# Patient Record
Sex: Female | Born: 1935
Health system: Southern US, Community
[De-identification: ages and names within clinical notes are randomized; demographics above are authoritative.]

## PROBLEM LIST (undated history)

## (undated) DIAGNOSIS — H269 Unspecified cataract: Secondary | ICD-10-CM

## (undated) DIAGNOSIS — T7840XA Allergy, unspecified, initial encounter: Secondary | ICD-10-CM

## (undated) DIAGNOSIS — C439 Malignant melanoma of skin, unspecified: Secondary | ICD-10-CM

## (undated) DIAGNOSIS — N390 Urinary tract infection, site not specified: Secondary | ICD-10-CM

## (undated) DIAGNOSIS — Z5189 Encounter for other specified aftercare: Secondary | ICD-10-CM

## (undated) DIAGNOSIS — M069 Rheumatoid arthritis, unspecified: Secondary | ICD-10-CM

## (undated) DIAGNOSIS — D649 Anemia, unspecified: Secondary | ICD-10-CM

## (undated) DIAGNOSIS — C801 Malignant (primary) neoplasm, unspecified: Secondary | ICD-10-CM

## (undated) HISTORY — DX: Malignant (primary) neoplasm, unspecified: C80.1

## (undated) HISTORY — DX: Urinary tract infection, site not specified: N39.0

## (undated) HISTORY — DX: Unspecified cataract: H26.9

## (undated) HISTORY — DX: Anemia, unspecified: D64.9

## (undated) HISTORY — PX: MASTECTOMY: SHX3

## (undated) HISTORY — PX: APPENDECTOMY: SHX54

## (undated) HISTORY — DX: Encounter for other specified aftercare: Z51.89

## (undated) HISTORY — PX: FRACTURE SURGERY: SHX138

## (undated) HISTORY — DX: Allergy, unspecified, initial encounter: T78.40XA

## (undated) HISTORY — PX: OTHER SURGICAL HISTORY: SHX169

## (undated) HISTORY — PX: JOINT REPLACEMENT: SHX530

## (undated) HISTORY — DX: Rheumatoid arthritis, unspecified: M06.9

## (undated) HISTORY — DX: Malignant melanoma of skin, unspecified: C43.9

---

## 2008-08-03 HISTORY — PX: BREAST SURGERY: SHX581

## 2011-02-10 ENCOUNTER — Inpatient Hospital Stay (INDEPENDENT_AMBULATORY_CARE_PROVIDER_SITE_OTHER)
Admission: RE | Admit: 2011-02-10 | Discharge: 2011-02-10 | Disposition: A | Discharge status: Home / Self Care | Payer: Medicare Other | Source: Ambulatory Visit | Attending: Emergency Medicine | Admitting: Emergency Medicine

## 2011-02-10 DIAGNOSIS — Z0489 Encounter for examination and observation for other specified reasons: Secondary | ICD-10-CM

## 2011-06-29 ENCOUNTER — Ambulatory Visit: Payer: Medicare Other | Attending: Family Medicine | Admitting: Physical Therapy

## 2011-06-29 DIAGNOSIS — M25559 Pain in unspecified hip: Secondary | ICD-10-CM | POA: Insufficient documentation

## 2011-06-29 DIAGNOSIS — R269 Unspecified abnormalities of gait and mobility: Secondary | ICD-10-CM | POA: Insufficient documentation

## 2011-06-29 DIAGNOSIS — R5381 Other malaise: Secondary | ICD-10-CM | POA: Insufficient documentation

## 2011-06-29 DIAGNOSIS — IMO0001 Reserved for inherently not codable concepts without codable children: Secondary | ICD-10-CM | POA: Insufficient documentation

## 2011-07-08 ENCOUNTER — Ambulatory Visit: Payer: Medicare Other | Attending: Family Medicine

## 2011-07-08 DIAGNOSIS — M25559 Pain in unspecified hip: Secondary | ICD-10-CM | POA: Insufficient documentation

## 2011-07-08 DIAGNOSIS — R5381 Other malaise: Secondary | ICD-10-CM | POA: Insufficient documentation

## 2011-07-08 DIAGNOSIS — R269 Unspecified abnormalities of gait and mobility: Secondary | ICD-10-CM | POA: Insufficient documentation

## 2011-07-08 DIAGNOSIS — IMO0001 Reserved for inherently not codable concepts without codable children: Secondary | ICD-10-CM | POA: Insufficient documentation

## 2011-07-13 ENCOUNTER — Ambulatory Visit: Payer: Medicare Other | Admitting: Physical Therapy

## 2011-07-15 ENCOUNTER — Ambulatory Visit: Payer: Medicare Other

## 2011-07-16 ENCOUNTER — Ambulatory Visit (INDEPENDENT_AMBULATORY_CARE_PROVIDER_SITE_OTHER): Payer: Medicare Other | Admitting: Family Medicine

## 2011-07-16 DIAGNOSIS — G478 Other sleep disorders: Secondary | ICD-10-CM

## 2011-07-16 DIAGNOSIS — M069 Rheumatoid arthritis, unspecified: Secondary | ICD-10-CM

## 2011-07-16 DIAGNOSIS — M25519 Pain in unspecified shoulder: Secondary | ICD-10-CM

## 2011-07-20 ENCOUNTER — Ambulatory Visit: Payer: Medicare Other | Admitting: Physical Therapy

## 2011-07-22 ENCOUNTER — Ambulatory Visit: Payer: Medicare Other

## 2011-07-23 ENCOUNTER — Ambulatory Visit: Payer: Self-pay | Admitting: Family Medicine

## 2011-08-05 ENCOUNTER — Ambulatory Visit: Payer: Medicare Other | Attending: Family Medicine

## 2011-08-05 DIAGNOSIS — IMO0001 Reserved for inherently not codable concepts without codable children: Secondary | ICD-10-CM | POA: Insufficient documentation

## 2011-08-05 DIAGNOSIS — R5381 Other malaise: Secondary | ICD-10-CM | POA: Insufficient documentation

## 2011-08-05 DIAGNOSIS — R269 Unspecified abnormalities of gait and mobility: Secondary | ICD-10-CM | POA: Insufficient documentation

## 2011-08-05 DIAGNOSIS — M25559 Pain in unspecified hip: Secondary | ICD-10-CM | POA: Insufficient documentation

## 2011-08-10 ENCOUNTER — Ambulatory Visit: Payer: Medicare Other

## 2011-08-12 ENCOUNTER — Ambulatory Visit: Payer: Medicare Other

## 2011-08-19 ENCOUNTER — Ambulatory Visit: Payer: Medicare Other

## 2011-08-26 ENCOUNTER — Ambulatory Visit: Payer: Medicare Other

## 2011-09-01 ENCOUNTER — Ambulatory Visit: Payer: Medicare Other | Admitting: Physical Therapy

## 2011-09-03 ENCOUNTER — Ambulatory Visit: Payer: Medicare Other | Admitting: Physical Therapy

## 2011-09-09 ENCOUNTER — Ambulatory Visit: Payer: Medicare Other | Attending: Family Medicine

## 2011-09-09 DIAGNOSIS — IMO0001 Reserved for inherently not codable concepts without codable children: Secondary | ICD-10-CM | POA: Insufficient documentation

## 2011-09-09 DIAGNOSIS — M25559 Pain in unspecified hip: Secondary | ICD-10-CM | POA: Insufficient documentation

## 2011-09-09 DIAGNOSIS — R5381 Other malaise: Secondary | ICD-10-CM | POA: Insufficient documentation

## 2011-09-09 DIAGNOSIS — R269 Unspecified abnormalities of gait and mobility: Secondary | ICD-10-CM | POA: Insufficient documentation

## 2011-09-10 ENCOUNTER — Ambulatory Visit: Payer: Medicare Other | Admitting: Family Medicine

## 2011-09-10 ENCOUNTER — Encounter: Payer: Self-pay | Admitting: Family Medicine

## 2011-09-10 ENCOUNTER — Telehealth: Payer: Self-pay

## 2011-09-10 ENCOUNTER — Ambulatory Visit (INDEPENDENT_AMBULATORY_CARE_PROVIDER_SITE_OTHER): Payer: Medicare Other | Admitting: Family Medicine

## 2011-09-10 VITALS — BP 143/70 | HR 95 | Temp 98.2°F | Resp 20 | Ht 62.0 in | Wt 119.2 lb

## 2011-09-10 DIAGNOSIS — M25519 Pain in unspecified shoulder: Secondary | ICD-10-CM

## 2011-09-10 DIAGNOSIS — G622 Polyneuropathy due to other toxic agents: Secondary | ICD-10-CM

## 2011-09-10 DIAGNOSIS — M069 Rheumatoid arthritis, unspecified: Secondary | ICD-10-CM

## 2011-09-10 DIAGNOSIS — G4700 Insomnia, unspecified: Secondary | ICD-10-CM

## 2011-09-10 DIAGNOSIS — M2550 Pain in unspecified joint: Secondary | ICD-10-CM

## 2011-09-10 DIAGNOSIS — G629 Polyneuropathy, unspecified: Secondary | ICD-10-CM | POA: Insufficient documentation

## 2011-09-10 DIAGNOSIS — R269 Unspecified abnormalities of gait and mobility: Secondary | ICD-10-CM

## 2011-09-10 DIAGNOSIS — Z9181 History of falling: Secondary | ICD-10-CM

## 2011-09-10 MED ORDER — ZOLPIDEM TARTRATE 5 MG PO TABS: ORAL_TABLET | ORAL | Status: DC

## 2011-09-10 MED ORDER — ZOLPIDEM TARTRATE 10 MG PO TABS: ORAL_TABLET | ORAL | Status: DC

## 2011-09-10 MED ORDER — DULOXETINE HCL 60 MG PO CPEP: 60.0000 mg | ORAL_CAPSULE | Freq: Every day | ORAL | Status: DC

## 2011-09-10 NOTE — Progress Notes (Signed)
  Subjective:    Patient ID: Patty Walters, female    DOB: 1922-05-13, 78 y.o.   MRN: [id number]  HPI  This elderly 78 y.o. Cauc female is here with her daughter for management of insomnia. The pt   [place] but had been trying to treat her sleeplessness  with OTC Diphehydramine which in ineffective. Also, use of pain med at bedtime tends to make the pt   a little unsteady on her feet. She is currently going to PT for gait training, chronic shoulder pain  and working on [employer]".  The pt did have a fall recently when she got up in middle of the night  to go to the bathroom (she had declined her daughter's offer for a bedside commode). After the fall,  she denied any significant pain or loss of mobility above her baseline. She has Rheumatoid Arthritis  and had been receiving an injectable medication which her daughter thinks was Reclast.   They would like a referral to Rheumatology for  evaluation and resumption of this med if appropriate.  She also needs a RF for Cymbalta which is very effective for chronic pain associated with RA.  Because of this chronic pain and the difficulty that travel to [place] would create, the pt and her daughter   went to [place] in December and and had a very enjoyable, relatively pain-free trip.  Review of Systems As per HPI; otherwise, noncontributory.  She has gained weight.    Objective:   Physical Exam  Vitals reviewed. Constitutional: She is oriented to person, place, and time. She appears well-developed and well-nourished. No distress.  HENT:  Head: Normocephalic and atraumatic.  Eyes: EOM are normal.  Pulmonary/Chest: Effort normal. No respiratory distress.  Musculoskeletal:       Shoulders: tender over anterior right joint/ biceps tendon. Decreased ROM- 90 degrees in anterior plane and abduction. (daughter states this is slightly improved with PT)  Neurological: She is alert and oriented to  person, place, and time. No cranial nerve deficit.  Skin: Skin is warm and dry.  Psychiatric: She has a normal mood and affect. Her behavior is normal. Thought content normal.          Assessment & Plan:   1. Insomnia  Rx: Ambien 5 mg 1 tablet every night.  D/C OTC med This medication had to be called to the pharmacy: it is a Controlled Substance and was not transmitted electronically.  2. Risk for falls  Place bedside commode for use at night. Continue PT.  3. Joint pain in the shoulder/clavicle region  Continue Iontophoresis which is effective. RF Cymbalta.  4. Neuropathy  This is chronic and stable. No further work-up planned at this time.  5. Rheumatoid arthritis  Ambulatory referral to Rheumatology

## 2011-09-10 NOTE — Telephone Encounter (Signed)
Per ramona dr Audria Nine called in [place] rx to pharmacy and notified pt

## 2011-09-10 NOTE — Patient Instructions (Signed)

## 2011-09-10 NOTE — Telephone Encounter (Signed)
Daughter states Remus Loffler rx was to be sent in to pharmacy today but pharmacy only has cymbalta rx

## 2011-09-11 ENCOUNTER — Telehealth: Payer: Self-pay

## 2011-09-11 MED ORDER — OXYCODONE-ACETAMINOPHEN 5-325 MG PO TABS: 1.0000 | ORAL_TABLET | Freq: Two times a day (BID) | ORAL | Status: AC

## 2011-09-11 NOTE — Telephone Encounter (Signed)
Called daughter and notified Rx ready for p/up.

## 2011-09-11 NOTE — Telephone Encounter (Signed)
Request must be approved by Dr. Audria Nine.  How often is she taking her pain medication?  Does it decrease her ability to safely move around or sedate her? (Please ask daughter and give response to Dr. Audria Nine.  DEA report is in her box in the doctor lounge).

## 2011-09-11 NOTE — Telephone Encounter (Signed)
Oxycodone Refill done.

## 2011-09-11 NOTE — Telephone Encounter (Signed)
.  [facility] [name] STATES SHE FORGOT TO GET HER MOM'S PERCOCET REFILLED ALONG WITH HER CYMBALTA PLEASE CALL [phone]   [facility] [address]

## 2011-09-15 ENCOUNTER — Ambulatory Visit: Payer: Medicare Other | Admitting: Physical Therapy

## 2011-09-18 ENCOUNTER — Ambulatory Visit: Payer: Medicare Other | Admitting: Family Medicine

## 2011-10-08 ENCOUNTER — Telehealth: Payer: Self-pay

## 2011-10-08 NOTE — Telephone Encounter (Signed)
Needs refill of pain medication earlier than usual because daughter is taking patient out of state for family gathering (possibly until 11/02/11).

## 2011-10-09 MED ORDER — OXYCODONE-ACETAMINOPHEN 5-325 MG PO TABS: 1.0000 | ORAL_TABLET | Freq: Two times a day (BID) | ORAL | Status: AC | PRN

## 2011-10-09 MED ORDER — OXYCODONE-ACETAMINOPHEN 5-325 MG PO TABS: 1.0000 | ORAL_TABLET | Freq: Three times a day (TID) | ORAL | Status: DC | PRN

## 2011-10-09 NOTE — Telephone Encounter (Signed)
Signed at TL desk - pt will need to pick-up

## 2011-10-09 NOTE — Telephone Encounter (Signed)
Leaving to go to [place] 10/13/11 for family gathering and will be gone at least 7-10 days and is requesting early refill on Oxycodone/Acetaminophine 5/325mg  1 po bid to take on the trip. Please advise and call when ready

## 2011-10-09 NOTE — Telephone Encounter (Signed)
Advised daughter that rx is ready for pickup

## 2011-11-09 ENCOUNTER — Telehealth: Payer: Self-pay

## 2011-11-09 NOTE — Telephone Encounter (Signed)
Pt daughter calling to request rx refill for percoset please contact when ready for pick-up

## 2011-11-09 NOTE — Telephone Encounter (Signed)
Dr. Audria Nine - please review.

## 2011-11-11 ENCOUNTER — Ambulatory Visit (INDEPENDENT_AMBULATORY_CARE_PROVIDER_SITE_OTHER): Payer: Medicare Other | Admitting: Family Medicine

## 2011-11-11 ENCOUNTER — Encounter: Payer: Self-pay | Admitting: Family Medicine

## 2011-11-11 VITALS — BP 123/65 | HR 87 | Temp 98.2°F | Resp 16 | Ht 62.0 in | Wt 124.0 lb

## 2011-11-11 DIAGNOSIS — R600 Localized edema: Secondary | ICD-10-CM

## 2011-11-11 DIAGNOSIS — R609 Edema, unspecified: Secondary | ICD-10-CM

## 2011-11-11 NOTE — Progress Notes (Signed)
78 yo woman with rheumatoid arthritis.  She saw Dr. Corliss Skains yesterday. She has been suffering with swollen right knee and edema of right lower extremity for several days.  She has been in the car quite a bit lately and daughter, who accompanies her today, is worried about a clot. No leg pain, warmth or redness  O:  1-2+ RIGHT lower extremity edema Heart regular at 72 per minute, no murmur or gallop Chest:  Clear Skin unremarkable  A:  Low risk for DVT given findings, but needs to be further evaluated.  P:  Venous doppler tomorrow. Elevate RLE bid.

## 2011-11-11 NOTE — Patient Instructions (Signed)
We will set up a venous doppler ASAP

## 2011-11-12 NOTE — Telephone Encounter (Signed)
Pt daughter is waiting on an order for a venus doppler to take her mom she doesn't know where she should be going pt daughter has an appt for dentist a 2pm should be done by 3pm she just wants to be to have time to get her mom there and not miss her appt, she is confused on if she was to call and make appt for her mom. Dr L seen pt in office. Please contact [phone]

## 2011-11-13 ENCOUNTER — Ambulatory Visit
Admission: RE | Admit: 2011-11-13 | Discharge: 2011-11-13 | Disposition: A | Discharge status: Home / Self Care | Payer: Medicare Other | Source: Ambulatory Visit | Attending: Family Medicine | Admitting: Family Medicine

## 2011-11-13 DIAGNOSIS — R600 Localized edema: Secondary | ICD-10-CM

## 2011-11-13 MED ORDER — OXYCODONE-ACETAMINOPHEN 5-325 MG PO TABS: 1.0000 | ORAL_TABLET | Freq: Three times a day (TID) | ORAL | Status: AC | PRN

## 2011-11-13 NOTE — Telephone Encounter (Signed)
Per April, patient states that Dr. Elbert Ewings gave patient this med at last OV.  Will shred rx.  Also, pt had dopplar on 4/10.

## 2011-11-13 NOTE — Telephone Encounter (Signed)
RX; oxycodone- APAP done and will be at [facility] for pick-up. Clairfy if Dr. Elbert Ewings was supposed to order the venous doppler since he was the provider who last saw pt.

## 2011-11-27 ENCOUNTER — Telehealth: Payer: Self-pay

## 2011-11-27 NOTE — Telephone Encounter (Signed)
LMOM to call back

## 2011-11-27 NOTE — Telephone Encounter (Signed)
Pt daughter is wanting to know if  Pt can get a referral for a four wheeled walker and if could she possibly get it with her moms medicare ins. Pt daughter would like for nurse or Dr to contact her.Marland Kitchen

## 2011-11-27 NOTE — Telephone Encounter (Signed)
Pts daughter would like to know if Remus Loffler can be called in

## 2011-11-30 NOTE — Telephone Encounter (Signed)
[name], pts daughter, called back and stated that they got the walker handled and do not need an order for this. Daughter stated that they had been OOT and had gotten Ambien filled for pt last time where they were, but now needs another RF. She states they are trying to not use it Qhs for pt and sometimes use OTC. She stated they are getting ready to leave town again w/pt and they will need a RF of pts pain medication also, please.

## 2011-11-30 NOTE — Telephone Encounter (Signed)
Unable to reach by phone

## 2011-12-01 NOTE — Telephone Encounter (Signed)
Pt daughter CB and further clarified that she had gotten the Ambien Rx filled for her mother while in [place] and they will not transfer the Rx back down here across state lines for another RF which is why she needs a Rx for that now. Daughter got a wheeled walker for pt second hand and just wanted to check w/Dr Audria Nine to make sure she thinks it is safe for her mother to use w/supervision. Pt is not able to pick up and put down a reg walker very well, but wants to make sure that the wheeled walker would be safe for her mother.

## 2011-12-02 ENCOUNTER — Other Ambulatory Visit: Payer: Self-pay | Admitting: Family Medicine

## 2011-12-02 MED ORDER — OXYCODONE-ACETAMINOPHEN 5-325 MG PO TABS: 1.0000 | ORAL_TABLET | Freq: Three times a day (TID) | ORAL | Status: DC | PRN

## 2011-12-02 MED ORDER — ZOLPIDEM TARTRATE 5 MG PO TABS: ORAL_TABLET | ORAL | Status: DC

## 2011-12-02 NOTE — Telephone Encounter (Signed)
Notified [name] that Rxs are ready for p/up and gave her message about PT for walker use. She stated she will call pt's PT that she had been seeing to see if she can get another appt. If not will CB for referral.

## 2011-12-02 NOTE — Telephone Encounter (Signed)
I will take care of the refills for Ambien and pain medication. I can not speak to the safety of the wheeled walker; often individuals need a little training with Physical Therapy to make sure they use the equipment properly and are not at risk of creating  a situation where they are at greater risk of falling. If the daughter observes that her mother is having difficulty ambulating with the walker or seems uncertain of safe use, then it would be best to have her work with a Physical therapist.

## 2011-12-29 ENCOUNTER — Telehealth: Payer: Self-pay

## 2011-12-29 NOTE — Telephone Encounter (Signed)
Pt daughter [name] would like for Dr Audria Nine nurse to contact her pt is due for a teeth cleaning and has had 2 hip replacements, [name] is wanting to know should she be prescribed some antibiotics to take before her teeth cleaning. [name] also would like Dr to know that she will be contacting pt insurance and they will be faxing an authorization form to [facility] this is to be filled out by Dr in order for pt to receive her rx on [place]. [name] is requesting a rx refill on pt percocet and to call when ready for pick up.Marland KitchenMarland Kitchen

## 2011-12-30 NOTE — Telephone Encounter (Signed)
Dr. Audria Nine, Please address.  Thanks.

## 2011-12-30 NOTE — Telephone Encounter (Signed)
Spoke with patients daughter and let her know that she will need to contact the dentist about the antibiotic.  She is going to have the insurance company fax over an authorization for the [place].  She would like a refill on the percocet and we can call her when ready for pick up if we can refill

## 2012-01-05 ENCOUNTER — Other Ambulatory Visit: Payer: Self-pay | Admitting: Family Medicine

## 2012-01-05 MED ORDER — OXYCODONE-ACETAMINOPHEN 5-325 MG PO TABS: 1.0000 | ORAL_TABLET | Freq: Three times a day (TID) | ORAL | Status: DC | PRN

## 2012-01-05 NOTE — Telephone Encounter (Signed)
RX for percocet done and will be at 102 for pick-up. I have refilled for #90 since Sig reads "1 tablet every 8 hours as needed for pain".

## 2012-01-06 NOTE — Telephone Encounter (Signed)
Notified pt that Rx is ready for p/up. Transferred to appt center for scheduling of appt.

## 2012-01-10 ENCOUNTER — Emergency Department (HOSPITAL_COMMUNITY)
Admission: EM | Admit: 2012-01-10 | Discharge: 2012-01-10 | Disposition: A | Discharge status: Home / Self Care | Payer: Medicare Other | Attending: Emergency Medicine | Admitting: Emergency Medicine

## 2012-01-10 ENCOUNTER — Encounter (HOSPITAL_COMMUNITY): Payer: Self-pay | Admitting: *Deleted

## 2012-01-10 DIAGNOSIS — R109 Unspecified abdominal pain: Secondary | ICD-10-CM | POA: Insufficient documentation

## 2012-01-10 DIAGNOSIS — M069 Rheumatoid arthritis, unspecified: Secondary | ICD-10-CM | POA: Insufficient documentation

## 2012-01-10 DIAGNOSIS — T148XXA Other injury of unspecified body region, initial encounter: Secondary | ICD-10-CM

## 2012-01-10 LAB — DIFFERENTIAL
Basophils Absolute: 0.1 10*3/uL (ref 0.0–0.1)
Basophils Relative: 1 % (ref 0–1)
Eosinophils Absolute: 0.4 10*3/uL (ref 0.0–0.7)
Monocytes Absolute: 0.4 10*3/uL (ref 0.1–1.0)
Monocytes Relative: 5 % (ref 3–12)
Neutro Abs: 6.1 10*3/uL (ref 1.7–7.7)

## 2012-01-10 LAB — BASIC METABOLIC PANEL
BUN: 36 mg/dL — ABNORMAL HIGH (ref 6–23)
Calcium: 9.1 mg/dL (ref 8.4–10.5)
Chloride: 106 mEq/L (ref 96–112)
Creatinine, Ser: 1.06 mg/dL (ref 0.50–1.10)
GFR calc Af Amer: 53 mL/min — ABNORMAL LOW (ref >90)
GFR calc non Af Amer: 45 mL/min — ABNORMAL LOW (ref >90)

## 2012-01-10 LAB — CBC
HCT: 28.2 % — ABNORMAL LOW (ref 36.0–46.0)
Hemoglobin: 9.3 g/dL — ABNORMAL LOW (ref 12.0–15.0)
MCH: 29.1 pg (ref 26.0–34.0)
MCHC: 33 g/dL (ref 30.0–36.0)
RDW: 13.6 % (ref 11.5–15.5)

## 2012-01-10 LAB — URINALYSIS, ROUTINE W REFLEX MICROSCOPIC
Ketones, ur: NEGATIVE mg/dL
Leukocytes, UA: NEGATIVE
Nitrite: NEGATIVE
Protein, ur: NEGATIVE mg/dL
Urobilinogen, UA: 0.2 mg/dL (ref 0.0–1.0)

## 2012-01-10 MED ORDER — OXYCODONE-ACETAMINOPHEN 5-325 MG PO TABS
1.0000 | ORAL_TABLET | Freq: Once | ORAL | Status: AC
  Administered 2012-01-10: 1 via ORAL
  Filled 2012-01-10: qty 1

## 2012-01-10 NOTE — ED Provider Notes (Signed)
History     CSN: [id number]  Arrival date & time 01/10/12  1219   First MD Initiated Contact with Patient 01/10/12 1411      Chief Complaint  Patient presents with  . Flank Pain    left    (Consider location/radiation/quality/duration/timing/severity/associated sxs/prior treatment) HPI Comments: Patient complains of left lateral pain along her lower abdomen.  Is not Amil Amen her left leg.  It is not over her rib cage.  Patient denies any nausea, vomiting, diarrhea.  She states she's had normal bowel movements.  No dysuria or hematuria today.  She's not eaten anything today but it's not because she's not hungry it's because family was going to bring her in for evaluation.  Patient denies any fevers.  There's been no new trauma, falls or other injuries.  Patient's been able to ambulate without difficulty.  She has no chest pain or shortness of breath.  She's not had pain like this before.  No history of kidney stones.  Patient is a 78 y.o. female presenting with flank pain. The history is provided by the patient and a relative. No language interpreter was used.  Flank Pain This is a new problem. Pertinent negatives include no chest pain, no abdominal pain, no headaches and no shortness of breath.    Past Medical History  Diagnosis Date  . Arthritis     Past Surgical History  Procedure Date  . Joint replacement   . Breast surgery Left mastectomy    History reviewed. No pertinent family history.  History  Substance Use Topics  . Smoking status: Never Smoker   . Smokeless tobacco: Never Used  . Alcohol Use: 0.6 oz/week    1 Glasses of wine per week     1 glass at dinner each night    OB History    Grav Para Term Preterm Abortions TAB SAB Ect Mult Living                  Review of Systems  Constitutional: Negative.  Negative for fever and chills.  HENT: Negative.   Eyes: Negative.  Negative for discharge and redness.  Respiratory: Negative.  Negative for cough and  shortness of breath.   Cardiovascular: Negative.  Negative for chest pain.  Gastrointestinal: Negative.  Negative for nausea, vomiting, abdominal pain and diarrhea.  Genitourinary: Negative for dysuria and flank pain.  Musculoskeletal: Positive for back pain.  Skin: Negative.  Negative for color change and rash.  Neurological: Negative.  Negative for syncope and headaches.  Hematological: Negative.  Negative for adenopathy.  Psychiatric/Behavioral: Negative.  Negative for confusion.  All other systems reviewed and are negative.    Allergies  Review of patient's allergies indicates no known allergies.  Home Medications   Current Outpatient Rx  Name Route Sig Dispense Refill  . ACETAMINOPHEN 500 MG PO TABS Oral Take 500 mg by mouth as needed.    Marland Kitchen CALCIUM CARBONATE-VITAMIN D 600-400 MG-UNIT PO TABS Oral Take 1 tablet by mouth daily.    . DULOXETINE HCL 60 MG PO CPEP Oral Take 1 capsule (60 mg total) by mouth daily. 30 capsule 5  . HYDROXYCHLOROQUINE SULFATE 200 MG PO TABS Oral Take by mouth daily.    Marland Kitchen ONE-DAILY MULTI VITAMINS PO TABS Oral Take 1 tablet by mouth daily.    . OXYCODONE-ACETAMINOPHEN 5-325 MG PO TABS Oral Take 1 tablet by mouth every 8 (eight) hours as needed. 90 tablet 0  . ZOLPIDEM TARTRATE 5 MG PO TABS  Take  1 tablet by mouth every night for insomnia. 30 tablet 3    BP 125/56  Pulse 77  Temp(Src) 97.8 F (36.6 C) (Oral)  Resp 17  SpO2 100%  Physical Exam  Nursing note and vitals reviewed. Constitutional: She is oriented to person, place, and time. She appears well-developed and well-nourished.  Non-toxic appearance. She does not have a sickly appearance.  HENT:  Head: Normocephalic and atraumatic.  Eyes: Conjunctivae, EOM and lids are normal. Pupils are equal, round, and reactive to light. No scleral icterus.  Neck: Trachea normal and normal range of motion. Neck supple.  Cardiovascular: Normal rate, regular rhythm and normal heart sounds.   Pulmonary/Chest:  Effort normal and breath sounds normal. No respiratory distress. She has no wheezes. She has no rales. She exhibits no tenderness.  Abdominal: Soft. Normal appearance. There is no tenderness. There is no rebound, no guarding and no CVA tenderness.  Musculoskeletal: Normal range of motion.       Arms: Neurological: She is alert and oriented to person, place, and time. She has normal strength.  Skin: Skin is warm, dry and intact. No rash noted.  Psychiatric: She has a normal mood and affect. Her behavior is normal. Judgment and thought content normal.    ED Course  Procedures (including critical care time)   Labs Reviewed  CBC  DIFFERENTIAL  BASIC METABOLIC PANEL  URINALYSIS, ROUTINE W REFLEX MICROSCOPIC   No results found.   No diagnosis found.    MDM  Patient with left lateral side pain along her lower abdomen.  It is of unclear etiology on history and exam.  Patient has no focal dysuria or hematuria.  No history of kidney stones.  She has a soft abdominal exam at this time.  She denies any nausea, vomiting or diarrhea to suggest GI cause.  She's had no fevers to suggest infection.  There is no signs of rash to suggest shingles.  No CVA tenderness to correlate with UTI.  No falls and no bony tenderness to suggest hip fracture or rib fracture.  Patient does not have any tenderness over her ribs on exam.  Patient has been able to ambulate today as per her normal.  I will check a UA for possible UTI and an atypical presentation.  We'll treat patient's pain with Percocet which she intermittently takes at home for her rheumatoid arthritis.  Check a CBC and BMP for occult infection or other abnormalities.        Nat Christen, MD 01/10/12 [phone]

## 2012-01-10 NOTE — ED Provider Notes (Signed)
Urinalysis reviewed and negative. She has reproducible tenderness on palpation of the oblique musculature. She will be discharged home with treatment for a muscle strain  Dayton Bailiff, MD 01/10/12 1655

## 2012-01-10 NOTE — Discharge Instructions (Signed)
Muscle Strain A muscle strain, or pulled muscle, occurs when a muscle is over-stretched. A small number of muscle fibers may also be torn. This is especially common in athletes. This happens when a sudden violent force placed on a muscle pushes it past its capacity. Usually, recovery from a pulled muscle takes 1 to 2 weeks. But complete healing will take 5 to 6 weeks. There are millions of muscle fibers. Following injury, your body will usually return to normal quickly. HOME CARE INSTRUCTIONS   While awake, apply ice to the sore muscle for 15 to 20 minutes each hour for the first 2 days. Put ice in a plastic bag and place a towel between the bag of ice and your skin.   Do not use the pulled muscle for several days. Do not use the muscle if you have pain.   You may wrap the injured area with an elastic bandage for comfort. Be careful not to bind it too tightly. This may interfere with blood circulation.   Only take over-the-counter or prescription medicines for pain, discomfort, or fever as directed by your caregiver. Do not use aspirin as this will increase bleeding (bruising) at injury site.   Warming up before exercise helps prevent muscle strains.  SEEK MEDICAL CARE IF:  There is increased pain or swelling in the affected area. MAKE SURE YOU:   Understand these instructions.   Will watch your condition.   Will get help right away if you are not doing well or get worse.  Document Released: 07/20/2005 Document Revised: 07/09/2011 Document Reviewed: 02/16/2007 Exit[employer] Patient Information 2012 [employer]. 

## 2012-01-10 NOTE — ED Notes (Signed)
Pt from home with reports of left flank pain that started during the night. Pt denies N/V/D, fall or dysuria.

## 2012-02-23 ENCOUNTER — Encounter (HOSPITAL_BASED_OUTPATIENT_CLINIC_OR_DEPARTMENT_OTHER): Payer: Self-pay | Admitting: Emergency Medicine

## 2012-02-23 ENCOUNTER — Emergency Department (HOSPITAL_BASED_OUTPATIENT_CLINIC_OR_DEPARTMENT_OTHER)
Admission: EM | Admit: 2012-02-23 | Discharge: 2012-02-24 | Disposition: A | Discharge status: Home / Self Care | Payer: Medicare Other | Attending: Emergency Medicine | Admitting: Emergency Medicine

## 2012-02-23 DIAGNOSIS — S7000XA Contusion of unspecified hip, initial encounter: Secondary | ICD-10-CM | POA: Insufficient documentation

## 2012-02-23 DIAGNOSIS — S0003XA Contusion of scalp, initial encounter: Secondary | ICD-10-CM | POA: Insufficient documentation

## 2012-02-23 DIAGNOSIS — W1811XA Fall from or off toilet without subsequent striking against object, initial encounter: Secondary | ICD-10-CM | POA: Insufficient documentation

## 2012-02-23 DIAGNOSIS — Z9010 Acquired absence of unspecified breast and nipple: Secondary | ICD-10-CM | POA: Insufficient documentation

## 2012-02-23 DIAGNOSIS — Y92009 Unspecified place in unspecified non-institutional (private) residence as the place of occurrence of the external cause: Secondary | ICD-10-CM | POA: Insufficient documentation

## 2012-02-23 DIAGNOSIS — Z79899 Other long term (current) drug therapy: Secondary | ICD-10-CM | POA: Insufficient documentation

## 2012-02-23 NOTE — ED Notes (Signed)
Pt started to sit down on the toilet, not knowing that her elevated toilet seat had been removed.  She went down to the toilet and then lost balance and fell off to the left, hitting her head on the cabinet on the way down.  Pt has been ambulatory post fall but was not able to go up stairs.  Skin is intact. Pt is not on blood thinners.  Sts head no longer hurts.

## 2012-02-24 ENCOUNTER — Emergency Department (HOSPITAL_BASED_OUTPATIENT_CLINIC_OR_DEPARTMENT_OTHER): Payer: Medicare Other

## 2012-02-24 MED ORDER — OXYCODONE-ACETAMINOPHEN 5-325 MG PO TABS: 1.0000 | ORAL_TABLET | ORAL | Status: DC | PRN

## 2012-02-24 NOTE — ED Provider Notes (Signed)
History     CSN: [id number]  Arrival date & time 02/23/12  2318   First MD Initiated Contact with Patient 02/23/12 2331      Chief Complaint  Patient presents with  . Fall    (Consider location/radiation/quality/duration/timing/severity/associated sxs/prior treatment) HPI Pt fell while trying to sit on toilet and fell to her left landing on her L hip and striking her head. No LOC. Pt has been able to ambulate since but was having pain climbing stair. Pt has been in her normal state of health. No fever, chills, N/V, focal weakness, sensory changes.  Past Medical History  Diagnosis Date  . Arthritis     Past Surgical History  Procedure Date  . Joint replacement   . Breast surgery Left mastectomy  . Mastectomy     No family history on file.  History  Substance Use Topics  . Smoking status: Never Smoker   . Smokeless tobacco: Never Used  . Alcohol Use: 0.6 oz/week    1 Glasses of wine per week     1 glass at dinner each night    OB History    Grav Para Term Preterm Abortions TAB SAB Ect Mult Living                  Review of Systems  Constitutional: Negative for fever and chills.  HENT: Negative for neck pain.   Eyes: Negative for visual disturbance.  Respiratory: Negative for shortness of breath.   Cardiovascular: Negative for chest pain.  Gastrointestinal: Negative for nausea, vomiting and abdominal pain.  Musculoskeletal: Negative for myalgias and back pain.  Skin: Negative for rash and wound.  Neurological: Positive for headaches. Negative for syncope, weakness and numbness.    Allergies  Review of patient's allergies indicates no known allergies.  Home Medications   Current Outpatient Rx  Name Route Sig Dispense Refill  . ACETAMINOPHEN 500 MG PO TABS Oral Take 500 mg by mouth as needed.    Marland Kitchen CALCIUM CARBONATE-VITAMIN D 600-400 MG-UNIT PO TABS Oral Take 1 tablet by mouth daily.    . DULOXETINE HCL 60 MG PO CPEP Oral Take 1 capsule (60 mg total) by  mouth daily. 30 capsule 5  . HYDROXYCHLOROQUINE SULFATE 200 MG PO TABS Oral Take by mouth daily.    Marland Kitchen ONE-DAILY MULTI VITAMINS PO TABS Oral Take 1 tablet by mouth daily.    . OXYCODONE-ACETAMINOPHEN 5-325 MG PO TABS Oral Take 1 tablet by mouth every 8 (eight) hours as needed. 90 tablet 0  . ZOLPIDEM TARTRATE 5 MG PO TABS  Take 1 tablet by mouth every night for insomnia. 30 tablet 3    BP 139/66  Pulse 70  Temp 98.5 F (36.9 C) (Oral)  Resp 16  Ht 5\' 5"  (1.651 m)  Wt 124 lb (56.246 kg)  BMI 20.63 kg/m2  SpO2 99%  Physical Exam  Nursing note and vitals reviewed. Constitutional: She is oriented to person, place, and time. She appears well-developed and well-nourished. No distress.  HENT:  Head: Normocephalic.  Mouth/Throat: Oropharynx is clear and moist.       Small hematoma to L parietal scalp. No deformity  Eyes: EOM are normal. Pupils are equal, round, and reactive to light.  Neck: Normal range of motion. Neck supple.       No posterior cervical TTP  Cardiovascular: Normal rate and regular rhythm.   Pulmonary/Chest: Effort normal and breath sounds normal. No respiratory distress. She has no wheezes. She has no rales.  Abdominal: Soft. Bowel sounds are normal. There is no tenderness. There is no rebound.  Musculoskeletal: Normal range of motion. She exhibits no edema and no tenderness.       FROM of L hip. No deformity or shortening. Mild ttp over lateral hip. 2+ DP bl  Neurological: She is alert and oriented to person, place, and time.       5/5 motor, sensation intact  Skin: Skin is warm and dry. No rash noted. No erythema.  Psychiatric: She has a normal mood and affect. Her behavior is normal.    ED Course  Procedures (including critical care time)  Labs Reviewed - No data to display Dg Hip Complete Left  02/24/2012  *RADIOLOGY REPORT*  Clinical Data: Fall with left hip pain.  LEFT HIP - COMPLETE 2+ VIEW  Comparison: None.  Findings: Bilateral total hip arthroplasties are  present.  There is no evidence of acute fracture or dislocation on the left. Components of the arthroplasty appear normally aligned.  There is no evidence of abnormal lucency surrounding the prosthesis.  Soft tissues are unremarkable.  IMPRESSION: No acute findings.  Normal alignment of left hip arthroplasty.  Original Report Authenticated By: Reola Calkins, M.D.   Ct Head Wo Contrast  02/24/2012  *RADIOLOGY REPORT*  Clinical Data: Fall with left head injury.  CT HEAD WITHOUT CONTRAST  Technique:  Contiguous axial images were obtained from the base of the skull through the vertex without contrast.  Comparison: None.  Findings: The brain stem, cerebellum, cerebral peduncles, thalami, basal ganglia, basilar cisterns, and ventricular system appear unremarkable.  Periventricular and corona radiata white matter hypodensities are most compatible with chronic ischemic microvascular white matter disease.  No intracranial hemorrhage, mass lesion, or acute infarction is identified.  Left vertex scalp hematoma noted.  IMPRESSION:  1.  Left vertex scalp hematoma. 2. Periventricular and corona radiata white matter hypodensities are most compatible with chronic ischemic microvascular white matter disease. 3.  No acute intracranial findings.  Original Report Authenticated By: Dellia Cloud, M.D.     1. Scalp hematoma   2. Contusion, hip       MDM          Loren Racer, MD 02/24/12 [phone]

## 2012-02-25 ENCOUNTER — Ambulatory Visit: Payer: Medicare Other

## 2012-02-25 ENCOUNTER — Ambulatory Visit (INDEPENDENT_AMBULATORY_CARE_PROVIDER_SITE_OTHER): Payer: Medicare Other | Admitting: Family Medicine

## 2012-02-25 ENCOUNTER — Encounter: Payer: Self-pay | Admitting: Family Medicine

## 2012-02-25 VITALS — BP 110/58 | HR 88 | Temp 98.2°F | Resp 16 | Wt 130.0 lb

## 2012-02-25 DIAGNOSIS — R0781 Pleurodynia: Secondary | ICD-10-CM

## 2012-02-25 DIAGNOSIS — D649 Anemia, unspecified: Secondary | ICD-10-CM

## 2012-02-25 DIAGNOSIS — R079 Chest pain, unspecified: Secondary | ICD-10-CM

## 2012-02-25 LAB — POCT CBC
Granulocyte percent: 78.8 %G (ref 37–80)
Hemoglobin: 10.2 g/dL — AB (ref 12.2–16.2)
MCH, POC: 27.6 pg (ref 27–31.2)
MCV: 91 fL (ref 80–97)
MPV: 7 fL (ref 0–99.8)
RBC: 3.69 M/uL — AB (ref 4.04–5.48)
WBC: 7.8 10*3/uL (ref 4.6–10.2)

## 2012-02-25 MED ORDER — OXYCODONE-ACETAMINOPHEN 5-325 MG PO TABS: 1.0000 | ORAL_TABLET | Freq: Three times a day (TID) | ORAL | Status: DC | PRN

## 2012-02-25 MED ORDER — DULOXETINE HCL 60 MG PO CPEP: 60.0000 mg | ORAL_CAPSULE | Freq: Every day | ORAL | Status: DC

## 2012-02-25 MED ORDER — HYDROXYCHLOROQUINE SULFATE 200 MG PO TABS: 200.0000 mg | ORAL_TABLET | Freq: Every day | ORAL | Status: DC

## 2012-02-25 NOTE — Progress Notes (Signed)
Subjective:    Patient ID: Patty Walters, female    DOB: 04-23-1922, 78 y.o.   MRN: [id number]  HPI Pt had a fall on 02/23/2012 and was evaluated at [facility] with head CT and left hip  xray. Today she c/o left rib pain which she can feel when she takes a deep breath. She still has pain  in left hip especially when climbing stairs. She denies cough with fever, hemoptysis or SOB. Her appetite is good and she has no nausea, vomiting or change in bladder or bowel habits. Thereihas been no bruising over the ribs in question.   Pt found to be anemic (this is chronic) upon evaluation in ED. Daughter would like blood counts  rechecked.   The history is obtained from pt's daughter with whom pt resides; she has concerns about persistent   pain because of an upcoming trip to [place] (they plan to travel by car).   Review of Systems  Constitutional: Negative.   Respiratory: Negative for cough, chest tightness, shortness of breath and wheezing.   Cardiovascular: Positive for chest pain. Negative for palpitations.       Rib pain on left side  Gastrointestinal: Negative.   Genitourinary: Negative.   Musculoskeletal: Positive for arthralgias and gait problem. Negative for joint swelling.  Neurological: Negative for dizziness, syncope, weakness, light-headedness and headaches.  Psychiatric/Behavioral: Negative for confusion, disturbed wake/sleep cycle and dysphoric mood. The patient is nervous/anxious.        Objective:   Physical Exam  Nursing note and vitals reviewed. Constitutional: She is oriented to person, place, and time. She appears well-developed and well-nourished. No distress.  HENT:  Head: Normocephalic and atraumatic.  Eyes: Conjunctivae and EOM are normal. No scleral icterus.  Cardiovascular: Normal rate and regular rhythm.   Pulmonary/Chest: Effort normal and breath sounds normal. No respiratory distress. She has no wheezes. She exhibits tenderness.       Left  ribcage: posterior mid-ribs tender with moderate palpation; no ecchymosis , redness or swelling  Musculoskeletal: She exhibits tenderness.       Bilat. Hip tenderness (L>R)  Neurological: She is alert and oriented to person, place, and time. No cranial nerve deficit.  Skin: Skin is warm and dry. No erythema. There is pallor.  Psychiatric: She has a normal mood and affect. Her behavior is normal. Thought content normal.    Results for orders placed in visit on 02/25/12  POCT CBC      Component Value Range   WBC 7.8  4.6 - 10.2 K/uL   Lymph, poc 1.2  0.6 - 3.4   POC LYMPH PERCENT 15.7  10 - 50 %L   MID (cbc) 0.4  0 - 0.9   POC MID % 5.5  0 - 12 %M   POC Granulocyte 6.1  2 - 6.9   Granulocyte percent 78.8  37 - 80 %G   RBC 3.69 (*) 4.04 - 5.48 M/uL   Hemoglobin 10.2 (*) 12.2 - 16.2 g/dL   HCT, POC 40.9 (*) 81.1 - 47.9 %   MCV 91.0  80 - 97 fL   MCH, POC 27.6  27 - 31.2 pg   MCHC 30.4 (*) 31.8 - 35.4 g/dL   RDW, POC 91.4     Platelet Count, POC 362  142 - 424 K/uL   MPV 7.0  0 - 99.8 fL     [facility] reading (PRIMARY) by  Dr. Audria Nine:  CXR- Heart size normal; increased peribronchial markings with no  acute changes or evidence of acute disease; very demineralized bones making it difficult to assess left rib abnormalities. Moderately severe kyphosis.        Assessment & Plan:   1. Rib pain on left side - due to contusion DG Chest 2 View, DG Ribs Unilateral W/Chest Left Continue Oxycodone-APAP 5-325 mg- RX printed Continue Tylenol 500 mg for mild pain  2. Anemia - Hgb improved from 9.3 last month in [facility] ED POCT CBC- see above

## 2012-02-25 NOTE — Patient Instructions (Addendum)
Rib Contusion A rib contusion (bruise) can occur by a blow to the chest or by a fall against a hard object. Usually these will be much better in a couple weeks. If X-rays were taken today and there are no broken bones (fractures), the diagnosis of bruising is made. However, broken ribs may not show up for several days, or may be discovered later on a routine X-ray when signs of healing show up. If this happens to you, it does not mean that something was missed on the X-ray, but simply that it did not show up on the first X-rays. Earlier diagnosis will not usually change the treatment. HOME CARE INSTRUCTIONS   Avoid strenuous activity. Be careful during activities and avoid bumping the injured ribs. Activities that pull on the injured ribs and cause pain should be avoided, if possible.   For the first day or two, an ice pack used every 20 minutes while awake may be helpful. Put ice in a plastic bag and put a towel between the bag and the skin.   Eat a normal, well-balanced diet. Drink plenty of fluids to avoid constipation.   Take deep breaths several times a day to keep lungs free of infection. Try to cough several times a day. Splint the injured area with a pillow while coughing to ease pain. Coughing can help prevent pneumonia.   Wear a rib belt or binder only if told to do so by your caregiver. If you are wearing a rib belt or binder, you must do the breathing exercises as directed by your caregiver. If not used properly, rib belts or binders restrict breathing which can lead to pneumonia.   Only take over-the-counter or prescription medicines for pain, discomfort, or fever as directed by your caregiver.  SEEK MEDICAL CARE IF:   You or your child has an oral temperature above 102 F (38.9 C).   Your baby is older than 3 months with a rectal temperature of 100.5 F (38.1 C) or higher for more than 1 day.   You develop a cough, with thick or bloody sputum.  SEEK IMMEDIATE MEDICAL CARE IF:     You have difficulty breathing.   You feel sick to your stomach (nausea), have vomiting or belly (abdominal) pain.   You have worsening pain, not controlled with medications, or there is a change in the location of the pain.   You develop sweating or radiation of the pain into the arms, jaw or shoulders, or become light headed or faint.   You or your child has an oral temperature above 102 F (38.9 C), not controlled by medicine.   Your or your baby is older than 3 months with a rectal temperature of 102 F (38.9 C) or higher.   Your baby is 20 months old or younger with a rectal temperature of 100.4 F (38 C) or higher.  MAKE SURE YOU:   Understand these instructions.   Will watch your condition.   Will get help right away if you are not doing well or get worse.  Document Released: 04/14/2001 Document Revised: 07/09/2011 Document Reviewed: 03/07/2008 [facility] Patient Information 2012 [place], [place]   .Contusion A contusion is a deep bruise. Contusions are the result of an injury that caused bleeding under the skin. The contusion may turn blue, purple, or yellow. Minor injuries will give you a painless contusion, but more severe contusions may stay painful and swollen for a few weeks.  CAUSES  A contusion is usually caused by  a blow, trauma, or direct force to an area of the body. SYMPTOMS   Swelling and redness of the injured area.   Bruising of the injured area.   Tenderness and soreness of the injured area.   Pain.  DIAGNOSIS  The diagnosis can be made by taking a history and physical exam. An X-ray, CT scan, or MRI may be needed to determine if there were any associated injuries, such as fractures. TREATMENT  Specific treatment will depend on what area of the body was injured. In general, the best treatment for a contusion is resting, icing, elevating, and applying cold compresses to the injured area. Over-the-counter medicines may also be recommended for pain  control. Ask your caregiver what the best treatment is for your contusion. HOME CARE INSTRUCTIONS   Put ice on the injured area.   Put ice in a plastic bag.   Place a towel between your skin and the bag.   Leave the ice on for 15 to 20 minutes, 3 to 4 times a day.   Only take over-the-counter or prescription medicines for pain, discomfort, or fever as directed by your caregiver. Your caregiver may recommend avoiding anti-inflammatory medicines (aspirin, ibuprofen, and naproxen) for 48 hours because these medicines may increase bruising.   Rest the injured area.   If possible, elevate the injured area to reduce swelling.  SEEK IMMEDIATE MEDICAL CARE IF:   You have increased bruising or swelling.   You have pain that is getting worse.   Your swelling or pain is not relieved with medicines.  MAKE SURE YOU:   Understand these instructions.   Will watch your condition.   Will get help right away if you are not doing well or get worse.  Document Released: 04/29/2005 Document Revised: 07/09/2011 Document Reviewed: 05/25/2011 [facility] Patient Information 2012 [place], [place].

## 2012-02-26 ENCOUNTER — Telehealth: Payer: Self-pay | Admitting: *Deleted

## 2012-02-26 NOTE — Telephone Encounter (Signed)
Left message in daughter's voice-mail with results of mother's xray results. Ribs negative no fractures, and chest normal no evidence of disease.

## 2012-02-29 DIAGNOSIS — D649 Anemia, unspecified: Secondary | ICD-10-CM | POA: Insufficient documentation

## 2012-03-02 ENCOUNTER — Telehealth: Payer: Self-pay

## 2012-03-02 NOTE — Telephone Encounter (Signed)
Pt's daughter states that pt was in on July 25,2013 for a fall, pt's feet are now swollen and she would like to know what should she do. [phone]

## 2012-03-02 NOTE — Telephone Encounter (Signed)
Spoke w/daughter and also advised her that she should bring pt in to be seen before trip to have swelling evaluated even if the swelling has improved a little since this is a new Sx for pt. Also D/W her Sxs of possible blood clot and advised her to bring pt in right away if she feels pt has any of these Sxs. Daughter agreed.

## 2012-03-02 NOTE — Telephone Encounter (Signed)
Called daughter she was here with her at office visit, Patty Walters, and left mssg for her to call me back, I am very concerned about her swollen feet and she needs to come back in.

## 2012-03-03 ENCOUNTER — Encounter: Payer: Self-pay | Admitting: Family Medicine

## 2012-03-03 ENCOUNTER — Ambulatory Visit (INDEPENDENT_AMBULATORY_CARE_PROVIDER_SITE_OTHER): Payer: Medicare Other | Admitting: Family Medicine

## 2012-03-03 VITALS — BP 134/71 | HR 82 | Temp 97.5°F | Resp 16 | Wt 132.0 lb

## 2012-03-03 DIAGNOSIS — R609 Edema, unspecified: Secondary | ICD-10-CM

## 2012-03-03 DIAGNOSIS — D649 Anemia, unspecified: Secondary | ICD-10-CM

## 2012-03-03 LAB — POCT CBC
Granulocyte percent: 78.1 %G (ref 37–80)
HCT, POC: 35 % — AB (ref 37.7–47.9)
Lymph, poc: 1.5 (ref 0.6–3.4)
MCHC: 29.1 g/dL — AB (ref 31.8–35.4)
MCV: 91.5 fL (ref 80–97)
MID (cbc): 0.5 (ref 0–0.9)
POC Granulocyte: 7 — AB (ref 2–6.9)
POC LYMPH PERCENT: 16.3 %L (ref 10–50)
Platelet Count, POC: 381 10*3/uL (ref 142–424)
RDW, POC: 14.3 %

## 2012-03-03 LAB — BASIC METABOLIC PANEL
BUN: 28 mg/dL — ABNORMAL HIGH (ref 6–23)
Calcium: 9.4 mg/dL (ref 8.4–10.5)
Glucose, Bld: 84 mg/dL (ref 70–99)
Potassium: 4.4 mEq/L (ref 3.5–5.3)

## 2012-03-03 MED ORDER — FUROSEMIDE 20 MG PO TABS: ORAL_TABLET | ORAL | Status: DC

## 2012-03-03 NOTE — Progress Notes (Signed)
  Subjective:    Patient ID: Patty Walters, female    DOB: 1922/01/08, 78 y.o.   MRN: [id number]  HPI    Review of Systems     Objective:   Physical Exam        Assessment & Plan:  7:15pm-spoke with patient's daughter "[name]."  Per Dr. Frederik Pear instructions, it is ok for her to take the lasix for a few days to help the swelling come off of her legs since her BUN<30.  Copy of labs made and left up front for them to p/up tomorrow.

## 2012-03-03 NOTE — Patient Instructions (Addendum)
Avoid excessive salt.  Get out of the car and walk frequently  Advise recheck as soon as she returned from your vacation. Go to an urgent care or emergency room if problems arise.  Only used the fluid pills if swelling seem significant. Continue to drink plenty of water.

## 2012-03-03 NOTE — Progress Notes (Signed)
Subjective: 78 year old lady who goes to Dr. Audria Nine. She was here one week ago. She is fallen and contused her self she has been and more swelling in her ankles last 3 days. She is getting ready to goes driving up to [place] tomorrow and a two-day drive to go to a family reading at the beach and wanted her checked before she went. She's not been short of breath. She's not had a cough. She is not had any chest pains.  Objective:  Pleasant lady in no acute distress. Her chest was clear. Heart regular without murmurs. Has 2+ pitting edema of the ankles and oh two thirds of the way up the anterior shin. No erythema.  Assessment: Edema Recent chest wall trauma anemia  Plan: BMET Stat, CBC, and EKG.  Her last BUN was 36 one month ago. Need to be certain that the BUN is safe for considering and a diuretic. At that frequent stops and low-salt diet.    Results for orders placed in visit on 03/03/12  POCT CBC      Component Value Range   WBC 8.9  4.6 - 10.2 K/uL   Lymph, poc 1.5  0.6 - 3.4   POC LYMPH PERCENT 16.3  10 - 50 %L   MID (cbc) 0.5  0 - 0.9   POC MID % 5.6  0 - 12 %M   POC Granulocyte 7.0 (*) 2 - 6.9   Granulocyte percent 78.1  37 - 80 %G   RBC 3.82 (*) 4.04 - 5.48 M/uL   Hemoglobin 10.2 (*) 12.2 - 16.2 g/dL   HCT, POC 16.1 (*) 09.6 - 47.9 %   MCV 91.5  80 - 97 fL   MCH, POC 26.7 (*) 27 - 31.2 pg   MCHC 29.1 (*) 31.8 - 35.4 g/dL   RDW, POC 04.5     Platelet Count, POC 381  142 - 424 K/uL   MPV 6.9  0 - 99.8 fL   Spoke with Marylene Land PA who will watch for stat labs and instruct on lasix

## 2012-03-04 ENCOUNTER — Encounter: Payer: Self-pay | Admitting: Family Medicine

## 2012-04-05 ENCOUNTER — Telehealth: Payer: Self-pay

## 2012-04-05 NOTE — Telephone Encounter (Signed)
pts daughter calling to say that mom needs refill on her perkiset if possible pharmacy [facility] [address]

## 2012-04-05 NOTE — Telephone Encounter (Signed)
Ok to refill Percocet

## 2012-04-07 ENCOUNTER — Other Ambulatory Visit: Payer: Self-pay | Admitting: Family Medicine

## 2012-04-07 MED ORDER — OXYCODONE-ACETAMINOPHEN 5-325 MG PO TABS: 1.0000 | ORAL_TABLET | Freq: Three times a day (TID) | ORAL | Status: DC | PRN

## 2012-04-07 NOTE — Telephone Encounter (Signed)
I have refilled the medication- this printed RX has to picked up. I will have staff member bring RX to [facility].

## 2012-05-09 ENCOUNTER — Telehealth: Payer: Self-pay

## 2012-05-09 NOTE — Telephone Encounter (Signed)
The patient's daughter called to request refill of Percocet.  Please call the patient's daughter when rx ready for pick up at [phone].

## 2012-05-11 ENCOUNTER — Other Ambulatory Visit: Payer: Self-pay | Admitting: Family Medicine

## 2012-05-11 MED ORDER — OXYCODONE-ACETAMINOPHEN 5-325 MG PO TABS: 1.0000 | ORAL_TABLET | Freq: Three times a day (TID) | ORAL | Status: DC | PRN

## 2012-06-02 ENCOUNTER — Ambulatory Visit: Payer: Medicare Other | Admitting: Family Medicine

## 2012-06-03 NOTE — Telephone Encounter (Signed)
Daughter [name], would like to have Ambien refilled.  The pharmacy [facility] has faxed twice, according to [facility] [place] with no refill.  Please call [phone]

## 2012-06-04 ENCOUNTER — Telehealth: Payer: Self-pay | Admitting: Physician Assistant

## 2012-06-04 MED ORDER — ZOLPIDEM TARTRATE 5 MG PO TABS: 5.0000 mg | ORAL_TABLET | Freq: Every evening | ORAL | Status: DC | PRN

## 2012-06-04 NOTE — Telephone Encounter (Signed)
I've already handled this-see other message from Pharmacy.  We received no request.  Order given.

## 2012-06-04 NOTE — Telephone Encounter (Signed)
Call from pharmacy regarding electronic request for this patient's Ambien.  Chart reviewed.  No request in queue.  Verbal order for the medication given, and documented in Meds & Orders.

## 2012-06-09 ENCOUNTER — Encounter: Payer: Self-pay | Admitting: Family Medicine

## 2012-06-09 ENCOUNTER — Ambulatory Visit (INDEPENDENT_AMBULATORY_CARE_PROVIDER_SITE_OTHER): Payer: Medicare Other | Admitting: Family Medicine

## 2012-06-09 VITALS — BP 126/75 | HR 70 | Temp 98.0°F | Resp 16 | Ht 65.0 in | Wt 133.0 lb

## 2012-06-09 DIAGNOSIS — M069 Rheumatoid arthritis, unspecified: Secondary | ICD-10-CM

## 2012-06-09 DIAGNOSIS — Z9181 History of falling: Secondary | ICD-10-CM

## 2012-06-09 DIAGNOSIS — M949 Disorder of cartilage, unspecified: Secondary | ICD-10-CM

## 2012-06-09 DIAGNOSIS — G894 Chronic pain syndrome: Secondary | ICD-10-CM

## 2012-06-09 DIAGNOSIS — D649 Anemia, unspecified: Secondary | ICD-10-CM

## 2012-06-09 DIAGNOSIS — R69 Illness, unspecified: Secondary | ICD-10-CM

## 2012-06-09 DIAGNOSIS — M898X9 Other specified disorders of bone, unspecified site: Secondary | ICD-10-CM

## 2012-06-09 LAB — COMPREHENSIVE METABOLIC PANEL
ALT: 12 U/L (ref 0–35)
AST: 19 U/L (ref 0–37)
Albumin: 4.2 g/dL (ref 3.5–5.2)
Alkaline Phosphatase: 66 U/L (ref 39–117)
BUN: 29 mg/dL — ABNORMAL HIGH (ref 6–23)
Calcium: 9.4 mg/dL (ref 8.4–10.5)
Chloride: 105 mEq/L (ref 96–112)
Potassium: 4.5 mEq/L (ref 3.5–5.3)
Sodium: 138 mEq/L (ref 135–145)
Total Protein: 7.1 g/dL (ref 6.0–8.3)

## 2012-06-09 MED ORDER — OXYCODONE-ACETAMINOPHEN 5-325 MG PO TABS: 1.0000 | ORAL_TABLET | Freq: Three times a day (TID) | ORAL | Status: DC | PRN

## 2012-06-09 NOTE — Patient Instructions (Addendum)
Iron Deficiency Anemia  Anemia is when you have a low number of healthy red blood cells. HOME CARE   Ask your doctor or dietician what foods you should eat.  Take iron and vitamins as told by your doctor.  Eat foods that have iron in them. This includes liver, lean beef, whole-grain bread, eggs, dried fruit, and dark green leafy vegetables. GET HELP RIGHT AWAY IF:  You pass out (faint).  You have chest pain.  You feel sick to your stomach (nauseous) or throw up (vomit).  You get very short of breath with activity.  You are weak.  You are thirstier than normal.  You have a fast heartbeat.  You start to sweat or become lightheaded when getting up from a chair or bed. MAKE SURE YOU:  Understand these instructions.  Will watch your condition.  Will get help right away if you are not doing well or get worse. Document Released: 08/22/2010 Document Revised: 10/12/2011 Document Reviewed: 08/22/2010 [facility] Patient Information 2013 [place], [place].   I need to know if pt received Pneumonax after she turned 67; please contact her previous physician's office to get this information if you can.

## 2012-06-10 LAB — CBC WITH DIFFERENTIAL/PLATELET
Basophils Absolute: 0.1 10*3/uL (ref 0.0–0.1)
Lymphocytes Relative: 17 % (ref 12–46)
Lymphs Abs: 1.1 10*3/uL (ref 0.7–4.0)
MCV: 84.4 fL (ref 78.0–100.0)
Neutro Abs: 4.5 10*3/uL (ref 1.7–7.7)
Neutrophils Relative %: 70 % (ref 43–77)
Platelets: 318 10*3/uL (ref 150–400)
RBC: 3.78 MIL/uL — ABNORMAL LOW (ref 3.87–5.11)
RDW: 14.1 % (ref 11.5–15.5)
WBC: 6.5 10*3/uL (ref 4.0–10.5)

## 2012-06-10 LAB — VITAMIN D 25 HYDROXY (VIT D DEFICIENCY, FRACTURES): Vit D, 25-Hydroxy: 21 ng/mL — ABNORMAL LOW (ref 30–89)

## 2012-06-10 NOTE — Progress Notes (Deleted)
Quick Note:  Notify pt of Normal results. ______ 

## 2012-06-10 NOTE — Progress Notes (Addendum)
Quick Note:  Please call pt and advise that the following labs are abnormal... (previously marked "Normal labs") Notify daughter of pt that her anemia is stable (Hgb = 10.6). Vitamin D level is 21; pt needs an extra OTC Vitamin D3 supplement 1000 IU to be taken once a day; she should continue with the current Calcium and Vit D that she is taking. Chemistries are normal ; really important that she drink more water!  Copy to pt/ pt's daughter. ______

## 2012-06-10 NOTE — Progress Notes (Signed)
S:  This pleasant elderly 78 y.o. Cauc female is here with her daughter for follow-up; she has Rheumatoid Arthritis with chronic pain, chronic anemia and insomnia. Daughter wants to try a new mail order pharmacy to save pt some money. She will need medication delivery ~ 1st week of Dec 2013. Pt's appetite is good and she is sleeping well with Zolpidem. She exhibits no abnormal behaviors and is active with family activities; she attended several weddings this summer and spent time at the beach. She was bale to go in the water in a special type of wheelchair. She is traveling again in the next few weeks. Daughter and her husband are going to take a road trip and a nurse will be coming into the home for several hours each day /night to monitor pt during daughter's absence.  Only other issue is persistent lower ext edema (no cough, SOB, DOE, orthopnea); Lasix 20 mg 1/2 tab for a few doses not very effective.  ROS: As per HPI.  O:  Filed Vitals:   06/09/12 1450  BP: 126/75                                Weight up 3 lbs since July  Pulse: 70  Temp: 98 F (36.7 C)  Resp: 16   GEN: In NAD: WN,WD. HEENT: [place]/AT; EOMI w/ clear conj and scl; Oropharynx benign, mucosa moist/ not pale. COR: RRR; trace pedal edema. LUNGS: CTA but decreased BS at bases. No rales or wheezes. EXT: No cyanosis or clubbing.   MS: Chronic deg changes evident in digits of UEs SKIN: Fair turgor; Slight pallor. NEURO: A&O x 3; CNs intact. Nonfocal. Mentation- pt is articulate and attentive to conversation (she has a hearing loss).  A/P: 1. Anemia - Hgb usually 10-11 g/dL CBC with Differential  2. Rheumatoid arthritis  Comprehensive metabolic panel,   3. Taking medication for chronic disease  Comprehensive metabolic panel  4. Chronic pain disorder  RF Oxycodone- APAP  5. Bone pain -suspect D deficiency given history and limited sun exposure; nutrition improved Vitamin D, 25-hydroxy  6. Risk for falls  Ambulatory referral to  Physical Therapy    Form completed to obtain medications from [facility]. Staff to fax to company; daughter requested delivery not start until Jul 03, 2012.

## 2012-06-15 ENCOUNTER — Encounter: Payer: Self-pay | Admitting: Family Medicine

## 2012-06-22 ENCOUNTER — Telehealth: Payer: Self-pay

## 2012-06-22 NOTE — Telephone Encounter (Signed)
Pt's daughter [name] would like all medications sent to [facility]. Best# [phone]

## 2012-06-22 NOTE — Telephone Encounter (Addendum)
[facility] has shipped Cymbalta and Plaquenil (generic). Ambien (90-day supply) requires prior authorization because that quantity exceeds the amount allowed for 64-month period. I have mail a RX: Oxycodone-APAP to the company so Pain med will not arrive with the other meds. RX mailed today (06/22/12).  Address for [facility]:  [facility]                                             [address]                                             [place], [place]  [address]  I tried to call for PA for Ambien (Zolpidem) but I do not have member ID number nor SSN (could not find it in "demographics").

## 2012-06-22 NOTE — Telephone Encounter (Signed)
Called daughter about meds she is asking for renewal on Ambien, Cymbalta, Plaquenil, and Percocet daughter is requesting 3 month supplies sent to [facility].

## 2012-06-23 NOTE — Telephone Encounter (Signed)
Spoke w/daughter and gave her update on all Rxs. Also got phone # and ID # for [employer] to call for prior auth for pt's zolpidem Rx. [employer] and received fax of PA form. Completed and faxed back. Awaiting decision.

## 2012-07-14 ENCOUNTER — Telehealth: Payer: Self-pay

## 2012-07-14 NOTE — Telephone Encounter (Signed)
Telephone call to patient's daughter, Patty Walters, regarding [employer] prior authorization request for zolpidem.  Daughter states she has been purchasing medicine out of pocket.  Advised her that we resubmitted the request today and are awaiting approval from [facility].   Daughter verbalized understanding and appreciation.   Patient's daughter inquired about physical therapy for patient being scheduled as in home therapy.  Told her I was not familiar with the physical therapy aspect of her mother's care and I would make Dr Audria Nine aware of her request. Followed up with Dr. Audria Nine.

## 2012-07-14 NOTE — Telephone Encounter (Signed)
Spoke w/daughter and notified her that prior Berkley Harvey was approved for pt's ambien. Daughter thanked [place] and stated that her mother has an appt w/the PT next week and they will let us know how it goes (pt is not going to want to go). Also reported that pt received her pain medication so all meds are taken care of.

## 2012-10-06 ENCOUNTER — Ambulatory Visit (INDEPENDENT_AMBULATORY_CARE_PROVIDER_SITE_OTHER): Payer: Medicare Other | Admitting: Family Medicine

## 2012-10-06 ENCOUNTER — Encounter: Payer: Self-pay | Admitting: Family Medicine

## 2012-10-06 VITALS — BP 132/74 | HR 74 | Temp 97.5°F | Resp 18 | Ht 63.0 in | Wt 139.0 lb

## 2012-10-06 DIAGNOSIS — M069 Rheumatoid arthritis, unspecified: Secondary | ICD-10-CM

## 2012-10-06 LAB — ALT: ALT: 13 U/L (ref 0–35)

## 2012-10-06 LAB — BASIC METABOLIC PANEL
BUN: 25 mg/dL — ABNORMAL HIGH (ref 6–23)
Calcium: 9.4 mg/dL (ref 8.4–10.5)
Glucose, Bld: 77 mg/dL (ref 70–99)
Sodium: 138 mEq/L (ref 135–145)

## 2012-10-06 LAB — CBC WITH DIFFERENTIAL/PLATELET
Basophils Absolute: 0.1 10*3/uL (ref 0.0–0.1)
Basophils Relative: 1 % (ref 0–1)
Eosinophils Absolute: 0.2 10*3/uL (ref 0.0–0.7)
Hemoglobin: 11 g/dL — ABNORMAL LOW (ref 12.0–15.0)
MCH: 28 pg (ref 26.0–34.0)
MCHC: 33.2 g/dL (ref 30.0–36.0)
Monocytes Absolute: 0.5 10*3/uL (ref 0.1–1.0)
Monocytes Relative: 8 % (ref 3–12)
Neutrophils Relative %: 69 % (ref 43–77)
RDW: 14.1 % (ref 11.5–15.5)

## 2012-10-06 LAB — TSH: TSH: 2.317 u[IU]/mL (ref 0.350–4.500)

## 2012-10-06 MED ORDER — OXYCODONE-ACETAMINOPHEN 5-325 MG PO TABS: 1.0000 | ORAL_TABLET | Freq: Three times a day (TID) | ORAL | Status: DC | PRN

## 2012-10-06 MED ORDER — ZOLPIDEM TARTRATE 5 MG PO TABS: 5.0000 mg | ORAL_TABLET | Freq: Every evening | ORAL | Status: DC | PRN

## 2012-10-06 NOTE — Patient Instructions (Addendum)

## 2012-10-08 NOTE — Progress Notes (Signed)
Quick Note:  Please contact pt and advise that the following labs are abnormal...  All labs look good; anemia is resolving with best Hemoglobin in last 9 months. (Hemoglobin was 10.6 back in Nov 2013). Kidney function is good; maintain good hydration. Thyroid test is normal.  Continue all current medications.  Copy to pt. ______

## 2012-10-11 ENCOUNTER — Telehealth: Payer: Self-pay

## 2012-10-11 NOTE — Progress Notes (Signed)
S:  This 79 y.o. Cauc female is here today w/ her daughter, [name], who is her primary caretaker; she is doing well with current medication regimen and reports no adverse effects. She has completed PT (did not complete the entire course) but has benefited with improved strength and gait stability. Her appetite is very good and sleep hygiene is good.  She has some redness and drainage from R eye (worse in AM); [name] plans to sch appt w/ eye specialist that pt saw shortly after moving here from [place].  Lower ext edema (R>L) persists; pt spends most hours of day seated in chair (not a recliner). Furosemide relieves swelling when used prn; less edema is evident when pt is active. Pt  Has no CP or tightness, SOB or cough, orthopnea, abdominal distention.  Daughter reports large hemorrhoid that she has to push back up into anus; great attention is given to cleaning anal area after BMs. Daughter states pt tends to sit on toilet for extended time. Pt c/o mild discomfort. No hematochezia or melena noted.  Pt has Rheumatoid Arthritis which has not progressed in last year or two. Joint pain is controlled on Plaquenil (generic) and blood tests have showed stable kidney and liver tests as well as slowly improving blood counts.  ROS: As per HPI.  O: Filed Vitals:   10/06/12 1323  BP: 132/74  Pulse: 74  Temp: 97.5 F (36.4 C)  Resp: 18   GEN: In NAD; WN,WD. HENT: [place]/AT; R eye- palpebral conjunctiva is intensely red with light-yellow papular lesions; eyelid slightly swollen. EOMI with mildly injected conj and clear sclerae. Otherwise benign. NECK: No LAN or TMG. COR: RRR. R lower ext w/ 1+ edema; L lower ext with trace edema. LUNGS: CTA; no rales or wheezes. Normal resp rate and effort. ABD: Decreased BS; soft, not tender or distended w/o guarding. No HSM or masses. RECTAL: Anua is patulous. Large hemorrhoid in anal canal; no active bleeding. NEURO: A&O x 3; CNs intact. Pt able to  Ambulate with  walker. Mentation at baseline.  A/P: Anemia, unspecified - Plan: CBC with Differential  Unspecified vitamin D deficiency- Vit D level= 21 in Nov 2013; continue supplement.  Taking medication for chronic disease - Plan: Basic metabolic panel, ALT  Edema - suspect dependent edema. Plan: TSH; agree w/ daughter that pt needs a recliner.  Rheumatoid arthritis- stable; cont Plaquenil.  Hemorrhoid prolapse - Plan: IFOBT POC (occult bld, rslt in office), Ambulatory referral to General Surgery   Meds ordered this encounter  Medications  . DISCONTD: oxyCODONE-acetaminophen (PERCOCET/ROXICET) 5-325 MG per tablet    Sig: Take 1 tablet by mouth every 8 (eight) hours as needed.    Dispense:  90 tablet    Refill:  0  . DISCONTD: zolpidem (AMBIEN) 5 MG tablet    Sig: Take 1 tablet (5 mg total) by mouth at bedtime as needed. Take 1 tablet by mouth every night for insomnia.    Dispense:  30 tablet    Refill:  0                . zolpidem (AMBIEN) 5 MG tablet    Sig: Take 1 tablet (5 mg total) by mouth at bedtime as needed. Take 1 tablet by mouth every night for insomnia.    Dispense:  30 tablet    Refill:  2            . DISCONTD: oxyCODONE-acetaminophen (PERCOCET/ROXICET) 5-325 MG per tablet    Sig: Take  1 tablet by mouth every 8 (eight) hours as needed.    Dispense:  90 tablet    Refill:  0    Do not refill before November 06, 2012.  Marland Kitchen oxyCODONE-acetaminophen (PERCOCET/ROXICET) 5-325 MG per tablet    Sig: Take 1 tablet by mouth every 8 (eight) hours as needed.    Dispense:  90 tablet    Refill:  0    Do not refill before Dec 06, 2012.

## 2012-10-11 NOTE — Telephone Encounter (Signed)
Patty Walters WOULD LIKE TO SPEAK WITH SOMEONE REGARDING HER MOM, STATES THE TL HAD CALLED THEM YESTERDAY PLEASE CALL [phone]

## 2012-10-11 NOTE — Telephone Encounter (Signed)
Called about the labs.

## 2012-10-17 ENCOUNTER — Encounter (INDEPENDENT_AMBULATORY_CARE_PROVIDER_SITE_OTHER): Payer: Self-pay | Admitting: Surgery

## 2012-10-17 ENCOUNTER — Ambulatory Visit (INDEPENDENT_AMBULATORY_CARE_PROVIDER_SITE_OTHER): Payer: Medicare Other | Admitting: Surgery

## 2012-10-17 VITALS — BP 122/76 | HR 76 | Temp 97.4°F | Resp 14 | Ht 65.0 in | Wt 139.8 lb

## 2012-10-17 DIAGNOSIS — K649 Unspecified hemorrhoids: Secondary | ICD-10-CM

## 2012-10-17 NOTE — Progress Notes (Signed)
Patient ID: Patty Walters, female   DOB: [birth year], 79 y.o.   MRN: [id number]  No chief complaint on file.   HPI Patty Walters is a 79 y.o. female.  Patient presents today at the request of Dr. Albin Fischer for hemorrhoids. She is accompanied by her daughter. She has a history of constipation and hemorrhoid prolapse after bowel movements that are reduced manually. No significant pain and minimal bleeding. She sits for prolonged periods, commode. HPI  Past Medical History  Diagnosis Date  . Arthritis     Past Surgical History  Procedure Laterality Date  . Joint replacement    . Breast surgery  Left mastectomy  . Mastectomy      Family History  Problem Relation Age of Onset  . Emphysema Father   . Cancer Sister     esophageal  . Cancer Brother     melanoma  . Hypertension Daughter   . Hypertension Son     Social History History  Substance Use Topics  . Smoking status: Never Smoker   . Smokeless tobacco: Never Used  . Alcohol Use: 0.6 oz/week    1 Glasses of wine per week     Comment: 1 glass at dinner each night    No Known Allergies  Current Outpatient Prescriptions  Medication Sig Dispense Refill  . acetaminophen (TYLENOL) 500 MG tablet Take 500 mg by mouth as needed.      . Calcium Carbonate-Vitamin D (CALTRATE 600+D) 600-400 MG-UNIT per tablet Take 1 tablet by mouth daily.      . DULoxetine (CYMBALTA) 60 MG capsule Take 1 capsule (60 mg total) by mouth daily.  30 capsule  5  . furosemide (LASIX) 20 MG tablet Take one half to one tablet daily only if needed for swelling  30 tablet  3  . hydroxychloroquine (PLAQUENIL) 200 MG tablet Take 1 tablet (200 mg total) by mouth daily.  30 tablet  5  . Multiple Vitamin (MULTIVITAMIN) tablet Take 1 tablet by mouth daily.      Marland Kitchen oxyCODONE-acetaminophen (PERCOCET/ROXICET) 5-325 MG per tablet Take 1 tablet by mouth every 8 (eight) hours as needed.  90 tablet  0  . zolpidem (AMBIEN) 5 MG tablet Take 1 tablet (5 mg total) by mouth at  bedtime as needed. Take 1 tablet by mouth every night for insomnia.  30 tablet  2   No current facility-administered medications for this visit.    Review of Systems Review of Systems  Constitutional: Negative.   HENT: Negative.   Respiratory: Negative.   Cardiovascular: Negative.   Gastrointestinal: Positive for constipation.  Musculoskeletal: Positive for myalgias, joint swelling, arthralgias and gait problem.  Skin: Negative.   Hematological: Negative.   Psychiatric/Behavioral: Negative.     Blood pressure 122/76, pulse 76, temperature 97.4 F (36.3 C), resp. rate 14, height 5\' 5"  (1.651 m), weight 139 lb 12.8 oz (63.413 kg).  Physical Exam Physical Exam  Constitutional: She appears well-developed and well-nourished.  HENT:  Head: Normocephalic and atraumatic.  Eyes: EOM are normal. Pupils are equal, round, and reactive to light.  Genitourinary:         Assessment    Grade 3 two  column prolapsed internal hemorrhoids    Plan    Sclerotherapy in office.  High fiber diet.  Instructions given on hemorrhoids.  Return as needed.  Informed consent obtained for injection.The procedure has been discussed with the patient.  Alternative therapies have been discussed with the patient.  Operative risks include bleeding,  Infection,  Organ injury,  Nerve injury,  Blood vessel injury,  DVT,  Pulmonary embolism,  Death,  And possible reoperation.  Medical management risks include worsening of present situation.  The success of the procedure is 50 -90 % at treating patients symptoms.  The patient understands and agrees to proceed.       Abid Bolla A. 10/17/2012, 10:30 AM

## 2012-10-17 NOTE — Patient Instructions (Signed)
High-Fiber Diet Fiber is found in fruits, vegetables, and grains. A high-fiber diet encourages the addition of more whole grains, legumes, fruits, and vegetables in your diet. The recommended amount of fiber for adult males is 38 g per day. For adult females, it is 25 g per day. Pregnant and lactating women should get 28 g of fiber per day. If you have a digestive or bowel problem, ask your caregiver for advice before adding high-fiber foods to your diet. Eat a variety of high-fiber foods instead of only a select few type of foods.  PURPOSE  To increase stool bulk.  To make bowel movements more regular to prevent constipation.  To lower cholesterol.  To prevent overeating. WHEN IS THIS DIET USED?  It may be used if you have constipation and hemorrhoids.  It may be used if you have uncomplicated diverticulosis (intestine condition) and irritable bowel syndrome.  It may be used if you need help with weight management.  It may be used if you want to add it to your diet as a protective measure against atherosclerosis, diabetes, and cancer. SOURCES OF FIBER  Whole-grain breads and cereals.  Fruits, such as apples, oranges, bananas, berries, prunes, and pears.  Vegetables, such as green peas, carrots, sweet potatoes, beets, broccoli, cabbage, spinach, and artichokes.  Legumes, such split peas, soy, lentils.  Almonds. FIBER CONTENT IN FOODS Starches and Grains / Dietary Fiber (g)  Cheerios, 1 cup / 3 g  Corn Flakes cereal, 1 cup / 0.7 g  Rice crispy treat cereal, 1 cup / 0.3 g  Instant oatmeal (cooked),  cup / 2 g  Frosted wheat cereal, 1 cup / 5.1 g  Brown, long-grain rice (cooked), 1 cup / 3.5 g  White, long-grain rice (cooked), 1 cup / 0.6 g  Enriched macaroni (cooked), 1 cup / 2.5 g Legumes / Dietary Fiber (g)  Baked beans (canned, plain, or vegetarian),  cup / 5.2 g  Kidney beans (canned),  cup / 6.8 g  Pinto beans (cooked),  cup / 5.5 g Breads and Crackers  / Dietary Fiber (g)  Plain or honey graham crackers, 2 squares / 0.7 g  Saltine crackers, 3 squares / 0.3 g  Plain, salted pretzels, 10 pieces / 1.8 g  Whole-wheat bread, 1 slice / 1.9 g  White bread, 1 slice / 0.7 g  Raisin bread, 1 slice / 1.2 g  Plain bagel, 3 oz / 2 g  Flour tortilla, 1 oz / 0.9 g  Corn tortilla, 1 small / 1.5 g  Hamburger or hotdog bun, 1 small / 0.9 g Fruits / Dietary Fiber (g)  Apple with skin, 1 medium / 4.4 g  Sweetened applesauce,  cup / 1.5 g  Banana,  medium / 1.5 g  Grapes, 10 grapes / 0.4 g  Orange, 1 small / 2.3 g  Raisin, 1.5 oz / 1.6 g  Melon, 1 cup / 1.4 g Vegetables / Dietary Fiber (g)  Green beans (canned),  cup / 1.3 g  Carrots (cooked),  cup / 2.3 g  Broccoli (cooked),  cup / 2.8 g  Peas (cooked),  cup / 4.4 g  Mashed potatoes,  cup / 1.6 g  Lettuce, 1 cup / 0.5 g  Corn (canned),  cup / 1.6 g  Tomato,  cup / 1.1 g Document Released: 07/20/2005 Document Revised: 01/19/2012 Document Reviewed: 10/22/2011 [facility] Patient Information 2013 [place], [place].  Hemorrhoids Hemorrhoids are veins in the rectum that get big. These veins can get blocked.  Blocked veins become puffy (swollen) and painful. HOME CARE  Eat more fiber.  Drink enough fluid to keep your pee (urine) clear or pale yellow.  Exercise often.  Avoid straining to poop (bowel movement).  Keep the butt area dry and clean.  Only take medicine as told by your doctor. If your hemorrhoids are puffy and painful:  Take a warm bath for 20 to 30 minutes. Do this 3 to 4 times a day.  Place ice packs on the area. Use the ice packs between the baths.  Put ice in a plastic bag.  Place a towel between your skin and the bag.  Leave the ice on for 15 to 20 minutes, 3 to 4 times a day.  Do not use a donut-shaped pillow. Do not sit on the toilet for a long time.  Go to the bathroom when your body has the urge to poop. This is so you do not strain  as much to poop. GET HELP RIGHT AWAY IF:   You have increasing pain that is not controlled with medicine.  You have uncontrolled bleeding.  You cannot poop.  You have pain or puffiness outside the area of the hemorrhoids.  You have chills.  You have a temperature by mouth above 102 F (38.9 C), not controlled by medicine. MAKE SURE YOU:   Understand these instructions.  Will watch your condition.  Will get help right away if you are not doing well or get worse. Document Released: 04/28/2008 Document Revised: 10/12/2011 Document Reviewed: 04/28/2008 [employer] Patient Information 2013 ExitCare           Sclerotherapy.    Injection of 1.5  Cc of sclerosing agent into complex.  Agent consists of almond oil extract and another chemical that causes scaring.  Placing a rubber band is another option but anatomy not conducive for this.     Add fiber supplement and Miralax

## 2012-10-23 ENCOUNTER — Other Ambulatory Visit (INDEPENDENT_AMBULATORY_CARE_PROVIDER_SITE_OTHER): Payer: Self-pay | Admitting: General Surgery

## 2012-11-02 ENCOUNTER — Ambulatory Visit (INDEPENDENT_AMBULATORY_CARE_PROVIDER_SITE_OTHER): Payer: Medicare Other | Admitting: Family Medicine

## 2012-11-02 ENCOUNTER — Telehealth: Payer: Self-pay

## 2012-11-02 VITALS — BP 150/90 | HR 77 | Temp 98.0°F | Resp 18 | Wt 138.0 lb

## 2012-11-02 DIAGNOSIS — L0100 Impetigo, unspecified: Secondary | ICD-10-CM

## 2012-11-02 DIAGNOSIS — R2243 Localized swelling, mass and lump, lower limb, bilateral: Secondary | ICD-10-CM

## 2012-11-02 DIAGNOSIS — R229 Localized swelling, mass and lump, unspecified: Secondary | ICD-10-CM

## 2012-11-02 MED ORDER — DOXYCYCLINE HYCLATE 100 MG PO TABS: 100.0000 mg | ORAL_TABLET | Freq: Two times a day (BID) | ORAL | Status: DC

## 2012-11-02 NOTE — Progress Notes (Signed)
Chief complaint: Wound on the right leg  History of present illness: Patient is a 79 year old female with bilateral lower extremity swelling coming in with a wound on the inside part of her right lower leg. Patient denies any pain, denies any fevers or chills, denies any bug bites. Patient does have a history of having necrotic skin infection previously needing a skin graft. Patient is accompanied by her daughter who is her primary caregiver. Patient's daughter brought her in today because of the worsening redness around this area. Patient's daughter has been putting on Neosporin daily which seems to be helping minimally. Patient denies any itching denies any radiation of any type of discomfort.  Past medical history, social, surgical and family history all reviewed.   Review of systems as stated above in the history of present illness  Physical exam Blood pressure 150/90, pulse 77, temperature 98 F (36.7 C), temperature source Oral, resp. rate 18, weight 138 lb (62.596 kg). General: No apparent distress patient does have a mild resting tremor baseline. Skin exam: Lower extremities bilaterally do have +2 pitting edema up to the knees. Right leg shows the patient does have a fascial injury that was corrected by skin graft previously. On the right midshaft distal area of the tibia on the medial aspect. This area is has some mild erythema surrounding the area but does have some yellow crusting. This appears to be more than impetigo and notes to signs of cellulitis. When pushed there is no discharge. Patient is nontender on exam. She is neurovascularly intact distally.    Assessment: Superficial skin infection, impetigo no signs of cellulitis  Plan: Patient given bacitracin ointment that she will put on 2 times daily. In addition to this patient was given a wait and see prescription for doxycycline in case there is worsening erythema. Patient told that compression stockings as well as Lasix could be  beneficial to help with the swelling of the lower extremities be worsening this. Patient will followup next week with her primary care provider.

## 2012-11-02 NOTE — Telephone Encounter (Signed)
Called daughter advised to d/c neosporin. Left message for her to call me back. No antibiotic without visit, but need more information about this.

## 2012-11-02 NOTE — Patient Instructions (Signed)
Very nice to meet you Will will use bacitracin ointment two times daily.  Take 1/2 pill daily for next 3 days of your lasix to help  Try wearing compression stalkings.  I am giving you a prescription of a medication in case you get worsening redness of the skin.   Follow up in one week with Dr. Audria Nine to make sure it gets better.

## 2012-11-02 NOTE — Telephone Encounter (Signed)
Pt daughter Lanora Manis states that pt has abrasion on lower right leg red around the edges. She has been keeping the area clean and using neurosporin, however she is worried that it may be infection and does not want to have to go through the trouble of having the patient come in so she would like to know if an antibiotic could be called in for this. ve an antibiotic   Best# [phone]

## 2012-11-03 ENCOUNTER — Telehealth: Payer: Self-pay | Admitting: Family Medicine

## 2012-11-03 NOTE — Telephone Encounter (Signed)
Patients daughter called back with update. States her leg is doing much better. She was given rx for abx insturcted not to fill unless getting worse. So she has not filled that, been using bactracin ointment, compression stockings, and elevating her leg.

## 2012-11-03 NOTE — Telephone Encounter (Signed)
Patient did come in yesterday for office visit. Left message for daughter to advise if she needs anything to let me know.

## 2012-11-03 NOTE — Telephone Encounter (Signed)
Called to check on her, left message for her daughter to call me back.

## 2012-11-15 ENCOUNTER — Ambulatory Visit: Payer: Medicare Other | Admitting: Family Medicine

## 2013-01-05 ENCOUNTER — Ambulatory Visit (INDEPENDENT_AMBULATORY_CARE_PROVIDER_SITE_OTHER): Payer: Medicare Other | Admitting: Family Medicine

## 2013-01-05 ENCOUNTER — Encounter: Payer: Self-pay | Admitting: Family Medicine

## 2013-01-05 VITALS — BP 128/65 | HR 89 | Temp 98.1°F | Ht 65.0 in | Wt 140.0 lb

## 2013-01-05 DIAGNOSIS — M1990 Unspecified osteoarthritis, unspecified site: Secondary | ICD-10-CM

## 2013-01-05 DIAGNOSIS — I4949 Other premature depolarization: Secondary | ICD-10-CM

## 2013-01-05 DIAGNOSIS — M25579 Pain in unspecified ankle and joints of unspecified foot: Secondary | ICD-10-CM

## 2013-01-05 DIAGNOSIS — Z760 Encounter for issue of repeat prescription: Secondary | ICD-10-CM

## 2013-01-05 DIAGNOSIS — R609 Edema, unspecified: Secondary | ICD-10-CM

## 2013-01-05 DIAGNOSIS — M25571 Pain in right ankle and joints of right foot: Secondary | ICD-10-CM

## 2013-01-05 DIAGNOSIS — I493 Ventricular premature depolarization: Secondary | ICD-10-CM

## 2013-01-05 DIAGNOSIS — H6120 Impacted cerumen, unspecified ear: Secondary | ICD-10-CM

## 2013-01-05 DIAGNOSIS — R600 Localized edema: Secondary | ICD-10-CM

## 2013-01-05 DIAGNOSIS — H6123 Impacted cerumen, bilateral: Secondary | ICD-10-CM

## 2013-01-05 LAB — COMPREHENSIVE METABOLIC PANEL
CO2: 21 mEq/L (ref 19–32)
Creat: 1.07 mg/dL (ref 0.50–1.10)
Glucose, Bld: 90 mg/dL (ref 70–99)
Total Bilirubin: 0.3 mg/dL (ref 0.3–1.2)

## 2013-01-05 MED ORDER — ZOLPIDEM TARTRATE 5 MG PO TABS: 5.0000 mg | ORAL_TABLET | Freq: Every evening | ORAL | Status: DC | PRN

## 2013-01-05 MED ORDER — HYDROXYCHLOROQUINE SULFATE 200 MG PO TABS: 200.0000 mg | ORAL_TABLET | Freq: Every day | ORAL | Status: DC

## 2013-01-05 MED ORDER — DULOXETINE HCL 60 MG PO CPEP: 60.0000 mg | ORAL_CAPSULE | Freq: Every day | ORAL | Status: DC

## 2013-01-05 MED ORDER — OXYCODONE-ACETAMINOPHEN 5-325 MG PO TABS: 1.0000 | ORAL_TABLET | Freq: Three times a day (TID) | ORAL | Status: DC | PRN

## 2013-01-05 NOTE — Patient Instructions (Signed)
I have placed orders for ENT and Cardiology referrals. Also, I have ordered a Lift Recliner and our staff will see if they can take care of the arrangements for this piece of medical equipment. If not, they will contact you.

## 2013-01-05 NOTE — Progress Notes (Signed)
Subjective:    Patient ID: Patty Walters, female    DOB: 1922/06/24, 79 y.o.   MRN: [id number]  HPI  This 79 y.o. Cauc female is here with her daughter, [name], for periodic evaluation. Persistent   R lower leg edema is a result of sedentary lifestyle and lack of limb elevation during the day. Low   dose Lasix does help. Pt did own a lift recliner when residing in [place] but getting that piece  of furniture here  would be difficult. [name] would like to try to purchase one here in [place] with [employer]  benefits for DME.   [name] reports DOE; this is new. Pt is not aware of dyspnea but reports diagnosis of "heart murmur"  in the past. This has not been associated w/ CP or tightness, cough or wheezing.  This was not evaluated by Cardiologist. [name] is concerned that pt is continuing to gain weight  due to improved appetite plus ice cream and other treats at bedtime.   Pt seems to have decreased hearing. [name] thinks ears need cleaning out. Pt has no sinus  congestion c/o ear pain or tinnitus.   Review of Systems  Constitutional: Positive for appetite change. Negative for fever, diaphoresis, fatigue and unexpected weight change.  HENT: Positive for hearing loss.   Eyes: Positive for discharge and redness.       Ophthalmologist treats this w/ antibiotic-steroid eye gtts.  Respiratory: Positive for shortness of breath. Negative for cough, chest tightness, wheezing and stridor.   Cardiovascular: Positive for leg swelling. Negative for chest pain and palpitations.       Chronic R leg edema/  Gastrointestinal: Negative.   Musculoskeletal: Positive for joint swelling and gait problem.       R ankle pain.  Neurological: Negative.   Psychiatric/Behavioral: Negative.        Objective:   Physical Exam  Nursing note and vitals reviewed. Constitutional: She is oriented to person, place, and time. She appears well-developed and well-nourished. No distress.  HENT:  Head: Normocephalic and atraumatic.   Right Ear: External ear normal. Decreased hearing is noted.  Left Ear: External ear normal. Decreased hearing is noted.  Nose: Nose normal. No nasal deformity or septal deviation. Right sinus exhibits no maxillary sinus tenderness and no frontal sinus tenderness. Left sinus exhibits no maxillary sinus tenderness and no frontal sinus tenderness.  Mouth/Throat: Uvula is midline, oropharynx is clear and moist and mucous membranes are normal. No oral lesions. Normal dentition.  Bilateral hard cerumen impactions; attempts to  remove w/ curette unsuccessful.  Eyes: EOM are normal. Right conjunctiva is injected. Left conjunctiva is injected. No scleral icterus.  Lids- erythematous (pt diagnosed w/ blepharitis).  Neck: Normal range of motion. Neck supple. No JVD present. No thyromegaly present.  Cardiovascular: Normal rate, regular rhythm, S1 normal and S2 normal.  Frequent extrasystoles are present. Exam reveals no gallop.   No murmur heard. Pulmonary/Chest: Effort normal and breath sounds normal. No respiratory distress. She has no wheezes.  Musculoskeletal: She exhibits edema and tenderness.       Right ankle: She exhibits swelling. Tenderness. Lateral malleolus tenderness found.       Right hand: She exhibits decreased range of motion, tenderness, bony tenderness, deformity and swelling. Decreased strength noted.       Left hand: She exhibits decreased range of motion, tenderness, deformity and swelling. Decreased strength noted.  R lower leg w/ 1+ pitting edema; erythema of skin around ankle and lateral malleolus red and tender.  Lymphadenopathy:    She has no cervical adenopathy.  Neurological: She is alert and oriented to person, place, and time. No cranial nerve deficit. She exhibits normal muscle tone. Coordination normal.  Skin: Skin is warm and dry.  Psychiatric: She has a normal mood and affect. Her behavior is normal. Judgment and thought content normal.         Assessment & Plan:   Frequent PVCs - noted in ECG in August 2013.  Plan: Comprehensive metabolic panel, Ambulatory referral to Cardiology  Leg edema, right - Plan: Uric Acid, RX be given to daughter who can then obtain Recliner w/ Lift mechanism through local DME retailer.  Right ankle pain - Plan: Uric Acid  Cerumen impaction, bilateral - Plan: Ambulatory referral to ENT  DJD (degenerative joint disease) - RX; Recliner with Lift mechanism for home use to help pt w/ elevation of lower ext.  Daughter requests Form completion from Dept of [facility] [id number] status or permanent need for regular aid and attendance.

## 2013-01-06 NOTE — Progress Notes (Signed)
Quick Note:  Please contact pt and advise that the following labs are abnormal... Uric acid (the test for gout) is a little above normal. This could be related to the chronic Rheumatoid Arthritis condition also. Make sure adequate hydration is maintained and reduce intake of foods that contribute to high uric acid (red meats and organ meats, sardines and certain shellfish, gravies, some beans, peas and lentils, asparagus, cauliflower, spinach and mushrooms). Certain foods in moderate amounts are recommended- low fat dairy, whole grains, coffee, 5 ounces of red wine, most vegetables, all fruits, tapioca, chocolate, spaghetti and macaroni.  Copy to pt. ______

## 2013-01-11 ENCOUNTER — Telehealth: Payer: Self-pay

## 2013-01-11 NOTE — Telephone Encounter (Signed)
Dr Audria Nine, do you recall?

## 2013-01-11 NOTE — Telephone Encounter (Signed)
[name] STATES SHE WAS TO PICK UP A COPY OF HER MOM'S LAB WORK AND ALSO IT WAS A FORM REGARDING A VA FOR DR MCPHERSON TO FILL OUT THE LAB WORK IS IN THE P/U DRAWER, BUT NOT THE OTHER ONE. WOULD LIKE TO PICK THEM BOTH UP WHEN READY PLEASE CALL [phone]

## 2013-01-11 NOTE — Telephone Encounter (Signed)
Form was completed and Tonna Corner was supposed to bring it to 102 this afternoon.

## 2013-01-12 NOTE — Telephone Encounter (Signed)
Advised daughter [name] that both things were ready for pickup at 102.

## 2013-01-12 NOTE — Telephone Encounter (Signed)
error 

## 2013-02-06 ENCOUNTER — Telehealth: Payer: Self-pay

## 2013-02-06 NOTE — Telephone Encounter (Signed)
Please call patient daughter at the end of the week for refill of patient medication  Pt and daughter are going to be traveling a lot and she needs these to take with her pain medication and Bertha Stakes number [phone]

## 2013-02-06 NOTE — Telephone Encounter (Signed)
Please advise on Oxycodone and Ambien refills

## 2013-02-07 MED ORDER — OXYCODONE-ACETAMINOPHEN 5-325 MG PO TABS: 1.0000 | ORAL_TABLET | Freq: Three times a day (TID) | ORAL | Status: DC | PRN

## 2013-02-07 NOTE — Telephone Encounter (Signed)
Pain medication refill printed and ready for pick-up at 104.  Zolpidem was refilled in June with 2 additional refills; there should still be 1 refill remaining. This medication will not be refilled at this time.

## 2013-02-07 NOTE — Telephone Encounter (Signed)
LMOM for Patty Walters that Oxy Rx is ready, but should be a RF left on zolpidem.

## 2013-03-08 ENCOUNTER — Other Ambulatory Visit: Payer: Self-pay

## 2013-03-11 ENCOUNTER — Telehealth: Payer: Self-pay

## 2013-03-11 NOTE — Telephone Encounter (Signed)
Patient's son called stated his Mom needs refill on pain medication. Please call to let them know  When to come pick up prescription. [phone]

## 2013-03-13 MED ORDER — OXYCODONE-ACETAMINOPHEN 5-325 MG PO TABS: 1.0000 | ORAL_TABLET | Freq: Three times a day (TID) | ORAL | Status: DC | PRN

## 2013-03-13 NOTE — Telephone Encounter (Signed)
Pain medication prescription printed out and will be left at [address] [facility] for pick-up by family member.

## 2013-04-06 ENCOUNTER — Encounter: Payer: Self-pay | Admitting: Family Medicine

## 2013-04-06 ENCOUNTER — Ambulatory Visit (INDEPENDENT_AMBULATORY_CARE_PROVIDER_SITE_OTHER): Payer: Medicare Other | Admitting: Family Medicine

## 2013-04-06 VITALS — BP 140/68 | HR 96 | Temp 98.0°F | Resp 18 | Ht 63.0 in | Wt 141.0 lb

## 2013-04-06 DIAGNOSIS — G894 Chronic pain syndrome: Secondary | ICD-10-CM

## 2013-04-06 DIAGNOSIS — Z760 Encounter for issue of repeat prescription: Secondary | ICD-10-CM

## 2013-04-06 MED ORDER — OXYCODONE-ACETAMINOPHEN 5-325 MG PO TABS: 1.0000 | ORAL_TABLET | Freq: Three times a day (TID) | ORAL | Status: DC | PRN

## 2013-04-06 MED ORDER — FUROSEMIDE 20 MG PO TABS: ORAL_TABLET | ORAL | Status: DC

## 2013-04-06 MED ORDER — ZOLPIDEM TARTRATE 5 MG PO TABS: 5.0000 mg | ORAL_TABLET | Freq: Every evening | ORAL | Status: DC | PRN

## 2013-04-06 NOTE — Patient Instructions (Signed)
I will find out about any vaccines that are recommended for patients beyond 77 years of age (such as a Pneumonia vaccine that has fewer strains). Get the Flu vaccine in October.  I would like to see you again in about 6 months. If you develop any symptoms of illness, please call for an appointment.

## 2013-04-07 ENCOUNTER — Encounter: Payer: Self-pay | Admitting: Family Medicine

## 2013-04-07 NOTE — Progress Notes (Signed)
S:  This 79 y.o. Cauc female is here today w/ her daughter who is her primary caretaker. Pt has RA w/ chronic pain and mild lower ext edema. SHe is stable on current medications and has no adverse effects. She had recent eval w/ Dr. Jacinto Halim for heart murmur and abnl ECG; pt has aortic stenosis and Dr Jacinto Halim thinks pt is stable and requires no further evaluation. Chronic edema responds well to prn Furosemide and elevation in a recliner. She is at risk for falls but has not had any incidents recently. Pt continues to travel and enjoys quality time spent w/ family members. She will be traveling to [place] and to [place] this fall. Daughter requests medication refills. Pt will get Flu vaccine in October or November.  PMHx, Soc Hx and Fam Hx reviewed; pt has one younger sister living who has Alz. Dementia.  ROS: Noncontributory.  O: Filed Vitals:   04/06/13 1316  BP: 140/68  Pulse: 96  Temp: 98 F (36.7 C)  Resp: 18   GEN: In NAD; WN,WD. Weight is up 8-9 lbs in last 12 months. HENT: [place]/AT; EOMI w/ clear conj/sclerae. Pt wears corrective lenses. SKIN: W&D; good turgor. No pallor. COR: RRR. LUNGS: Normal resp rate and effort. NEURO: A&O x 3; CNs intact. Ambulates w/ a walker.  A/P: Chronic pain syndrome  Prescription refill  Meds ordered this encounter  Medications  . DISCONTD: oxyCODONE-acetaminophen (PERCOCET/ROXICET) 5-325 MG per tablet    Sig: Take 1 tablet by mouth every 8 (eight) hours as needed.    Dispense:  100 tablet    Refill:  0  . oxyCODONE-acetaminophen (PERCOCET/ROXICET) 5-325 MG per tablet    Sig: Take 1 tablet by mouth every 8 (eight) hours as needed.    Dispense:  100 tablet    Refill:  0    Do not fill before May 13, 2013.  . zolpidem (AMBIEN) 5 MG tablet    Sig: Take 1 tablet (5 mg total) by mouth at bedtime as needed. Take 1 tablet by mouth every night for insomnia.    Dispense:  30 tablet    Refill:  2            . furosemide (LASIX) 20 MG tablet   Sig: Take one half to one tablet daily only if needed for swelling    Dispense:  30 tablet    Refill:  3

## 2013-06-05 ENCOUNTER — Telehealth: Payer: Self-pay

## 2013-06-05 NOTE — Telephone Encounter (Signed)
PT DAUGHTER IS WANTING TO TALK WITH DR DOOLITTLE ABOUT HAVING A REFERRAL TO PULMONARY   BEST NUMBER [phone] [name]

## 2013-06-06 NOTE — Telephone Encounter (Signed)
Patients daughter [name] indicates patient has been making noises when she breathes, this has been for a long time, has been exertional, but now has gotten constant. Please advise, I have recommended they bring her in here, to make sure it is not a fluid overload. The daughter did call here for an appointment, was told to bring her to the Urgent Care, but they do not want to do this, issue has been here for a long time. Please advise, if you think she could see you some time. I will call her back.

## 2013-06-08 ENCOUNTER — Other Ambulatory Visit: Payer: Self-pay

## 2013-06-13 ENCOUNTER — Ambulatory Visit (INDEPENDENT_AMBULATORY_CARE_PROVIDER_SITE_OTHER): Payer: Medicare Other | Admitting: Internal Medicine

## 2013-06-13 ENCOUNTER — Ambulatory Visit (INDEPENDENT_AMBULATORY_CARE_PROVIDER_SITE_OTHER)
Admission: RE | Admit: 2013-06-13 | Discharge: 2013-06-13 | Disposition: A | Discharge status: Home / Self Care | Payer: Medicare Other | Source: Ambulatory Visit | Attending: Internal Medicine | Admitting: Internal Medicine

## 2013-06-13 ENCOUNTER — Encounter: Payer: Self-pay | Admitting: Internal Medicine

## 2013-06-13 VITALS — BP 140/72 | HR 89 | Temp 98.1°F | Ht 63.0 in | Wt 147.0 lb

## 2013-06-13 DIAGNOSIS — R05 Cough: Secondary | ICD-10-CM | POA: Insufficient documentation

## 2013-06-13 MED ORDER — FAMOTIDINE 20 MG PO TABS: ORAL_TABLET | ORAL | Status: DC

## 2013-06-13 MED ORDER — PANTOPRAZOLE SODIUM 40 MG PO TBEC: 40.0000 mg | DELAYED_RELEASE_TABLET | Freq: Every day | ORAL | Status: DC

## 2013-06-13 NOTE — Progress Notes (Signed)
  Subjective:    Patient ID: Patty Walters, female    DOB: Aug 31, 1921  MRN: [id number]  HPI  84 yowf [place] refugee  With RA never smoker remote " bad pneumonia" but no ET but ne recovered completely self referred to pulmonary clinic 06/13/2013 for noisy breathing onset Fall 2014   06/13/2013 1st [facility] office visit/ Wert cc indolent onset, dry cough x one month with noisy breathing x maybe a year- cough is more later in day, no noct cough, really not limited by breathing very very inactive. Feels RA well controlled   No obvious day to day   variabilty or assoc   cp or chest tightness, subjective wheeze overt sinus or hb symptoms. No unusual exp hx or h/o childhood pna/ asthma or knowledge of premature birth.  Sleeping ok without nocturnal  or early am exacerbation  of respiratory  c/o's or need for noct saba. Also denies any obvious fluctuation of symptoms with weather or environmental changes or other aggravating or alleviating factors except as outlined above   Current Medications, Allergies, Complete Past Medical History, Past Surgical History, Family History, and Social History were reviewed in [employer] record.   ry.        Review of Systems  Constitutional: Negative for fever, chills and unexpected weight change.  HENT: Positive for dental problem. Negative for congestion, ear pain, nosebleeds, postnasal drip, rhinorrhea, sinus pressure, sneezing, sore throat, trouble swallowing and voice change.   Eyes: Negative for visual disturbance.  Respiratory: Positive for cough and shortness of breath. Negative for choking.   Cardiovascular: Positive for leg swelling. Negative for chest pain.  Gastrointestinal: Negative for vomiting, abdominal pain and diarrhea.  Genitourinary: Negative for difficulty urinating.  Musculoskeletal: Positive for arthralgias.  Skin: Negative for rash.  Neurological: Negative for tremors, syncope and headaches.   Hematological: Does not bruise/bleed easily.       Objective:   Physical Exam  slt hoarse amb wf nad wheelchair bound very min upper airway "wheezing" on  insp and exp   Wt Readings from Last 3 Encounters:  06/13/13 147 lb (66.679 kg)  04/06/13 141 lb (63.957 kg)  01/05/13 140 lb (63.504 kg)       HEENT: nl dentition, turbinates, and orophanx. Nl external ear canals without cough reflex   NECK :  without JVD/Nodes/TM/ nl carotid upstrokes bilaterally   LUNGS: no acc muscle use, clear to A and P bilaterally without cough on insp or exp maneuvers   CV:  RRR  no s3 or murmur or increase in P2, no edema   ABD:  soft and nontender with nl excursion in the supine position. No bruits or organomegaly, bowel sounds nl  MS:  warm without deformities, calf tenderness, cyanosis or clubbing  SKIN: warm and dry without lesions    NEURO:  alert, approp, no deficits      CXR  06/13/2013 : Biapical scarring. Mild cardiomegaly. No acute findings.     Assessment & Plan:

## 2013-06-13 NOTE — Patient Instructions (Signed)
Pantoprazole (protonix) 40 mg Take 30-60 min before first meal of the day and Pepcid 20 mg one bedtime until return to office - this is the best way to tell whether stomach acid is contributing to your problem.    GERD (REFLUX)  is an extremely common cause of respiratory symptoms, many times with no significant heartburn at all.    It can be treated with medication, but also with lifestyle changes including avoidance of late meals, excessive alcohol, smoking cessation, and avoid fatty foods, chocolate, peppermint, colas, red wine, and acidic juices such as orange juice.  NO MINT OR MENTHOL PRODUCTS SO NO COUGH DROPS  USE SUGARLESS CANDY INSTEAD (jolley ranchers or Stover's)  NO OIL BASED VITAMINS - use powdered substitutes. NO calcium Carbonate (citrate or gluconate are ok)    Please remember to go to the x-ray department downstairs for your tests - we will call you with the results when they are available.  Please schedule a follow up office visit in 4 weeks, sooner if needed

## 2013-06-13 NOTE — Telephone Encounter (Signed)
Just returned from vacation today. I reviewed chart and see that pt/daughter self-referred to Pulmonary- Dr. Sherene Sires. Pt seen today by that specialist. I will try to call daughter and discuss work-up by Dr. Sherene Sires.

## 2013-06-14 ENCOUNTER — Telehealth: Payer: Self-pay | Admitting: *Deleted

## 2013-06-14 NOTE — Telephone Encounter (Signed)
Spoke with pt's daughter and notified of results per Dr. Wert. She verbalized understanding and denied any questions. 

## 2013-06-14 NOTE — Telephone Encounter (Signed)
Message copied by Christen Butter on Wed Jun 14, 2013 11:07 AM ------      Message from: Nyoka Cowden      Created: Tue Jun 13, 2013  4:38 PM       Call pt: no acute changes on cxr so not change rx ------

## 2013-06-15 ENCOUNTER — Other Ambulatory Visit: Payer: Self-pay | Admitting: Radiology

## 2013-06-15 MED ORDER — OXYCODONE-ACETAMINOPHEN 5-325 MG PO TABS: 1.0000 | ORAL_TABLET | Freq: Three times a day (TID) | ORAL | Status: DC | PRN

## 2013-06-15 NOTE — Assessment & Plan Note (Signed)
The most common causes of chronic cough in immunocompetent adults include the following: upper airway cough syndrome (UACS), previously referred to as postnasal drip syndrome (PNDS), which is caused by variety of rhinosinus conditions; (2) asthma; (3) GERD; (4) chronic bronchitis from cigarette smoking or other inhaled environmental irritants; (5) nonasthmatic eosinophilic bronchitis; and (6) bronchiectasis.   These conditions, singly or in combination, have accounted for up to 94% of the causes of chronic cough in prospective studies.   Other conditions have constituted no >6% of the causes in prospective studies These have included bronchogenic carcinoma, chronic interstitial pneumonia, sarcoidosis, left ventricular failure, ACEI-induced cough, and aspiration from a condition associated with pharyngeal dysfunction.    Chronic cough is often simultaneously caused by more than one condition. A single cause has been found from 38 to 82% of the time, multiple causes from 18 to 62%. Multiply caused cough has been the result of three diseases up to 42% of the time.     Of the three most common causes of chronic cough, only one (GERD)  can actually cause the other two (asthma and post nasal drip syndrome)  and perpetuate the cylce of cough inducing airway trauma, inflammation, heightened sensitivity to reflux which is prompted by the cough itself via a cyclical mechanism.    This may partially respond to steroids and look like asthma and post nasal drainage but never erradicated completely unless the cough and the secondary reflux are eliminated, preferably both at the same time.  While not intuitively obvious, many patients with chronic low grade reflux do not cough until there is a secondary insult that disturbs the protective epithelial barrier and exposes sensitive nerve endings.  This can be viral or direct physical injury such as with an endotracheal tube.   The point is that once this occurs, it is  difficult to eliminate using anything but a maximally effective acid suppression regimen at least in the short run, accompanied by an appropriate diet to address non acid GERD.   Other ddx includes cricoaretenoiditis from RA or stricture from prev ET and may benefit from F/V loop and ENT eval if not better from gerd rx

## 2013-06-15 NOTE — Telephone Encounter (Signed)
Patient has been out of town. She has recently had trouble when up there. She has been making noises when she breathes, the Pulmonologist has advised this may be from Reflux. He thinks the noise is from this. He did prescribe her medications for reflux. Patients daughter is requesting a refill on the Oxycodone. Please advise.

## 2013-06-15 NOTE — Telephone Encounter (Signed)
I see where Dr. Sherene Sires prescribed Protonix as well as Pepcid (famotidine) for reflux. These meds should help with those symptoms. RX: Oxycodone printed and will be brought to 102 for signature and pick-up.

## 2013-07-02 ENCOUNTER — Other Ambulatory Visit: Payer: Self-pay | Admitting: Family Medicine

## 2013-07-04 MED ORDER — DULOXETINE HCL 60 MG PO CPEP: ORAL_CAPSULE | ORAL | Status: DC

## 2013-07-04 MED ORDER — HYDROXYCHLOROQUINE SULFATE 200 MG PO TABS: ORAL_TABLET | ORAL | Status: DC

## 2013-07-06 ENCOUNTER — Other Ambulatory Visit: Payer: Self-pay | Admitting: Family Medicine

## 2013-07-06 NOTE — Telephone Encounter (Signed)
Zolpidem refill phoned to pt's pharmacy. 

## 2013-07-11 ENCOUNTER — Ambulatory Visit (INDEPENDENT_AMBULATORY_CARE_PROVIDER_SITE_OTHER): Payer: Medicare Other | Admitting: Internal Medicine

## 2013-07-11 ENCOUNTER — Encounter: Payer: Self-pay | Admitting: Internal Medicine

## 2013-07-11 VITALS — BP 130/66 | HR 79 | Temp 98.2°F | Ht 63.0 in | Wt 145.0 lb

## 2013-07-11 DIAGNOSIS — R059 Cough, unspecified: Secondary | ICD-10-CM

## 2013-07-11 DIAGNOSIS — R05 Cough: Secondary | ICD-10-CM

## 2013-07-11 NOTE — Progress Notes (Signed)
Subjective:    Patient ID: Patty Walters, female    DOB: 1922-06-26  MRN: [id number]     Brief patient profile:  89 yowf [place] refugee  With RA never smoker remote " bad pneumonia" but no ET  -  recovered completely self referred to pulmonary clinic 06/13/2013 for noisy breathing onset Fall 2014    History of Present Illness  06/13/2013 1st [facility] office visit/ Patty Walters cc indolent onset, dry cough x one month with noisy breathing x maybe a year- cough is more later in day, no noct cough, really not limited by breathing very very inactive. Feels RA well controlled  rec Pantoprazole (protonix) 40 mg Take 30-60 min before first meal of the day and Pepcid 20 mg one bedtime until return to office - this is the best way to tell whether stomach acid is contributing to your problem.   GERD diet   07/11/2013 f/u ov/Patty Walters re: chronic upper airway cough and hoarseness  Chief Complaint  Patient presents with  . Follow-up    Pt states SOB and cough are unchanged since the last visit. No new co's today.      No obvious day to day or daytime variabilty or assoc chronic cough or cp or chest tightness, subjective wheeze overt sinus or hb symptoms. No unusual exp hx or h/o childhood pna/ asthma or knowledge of premature birth.  Sleeping ok without nocturnal  or early am exacerbation  of respiratory  c/o's or need for noct saba. Also denies any obvious fluctuation of symptoms with weather or environmental changes or other aggravating or alleviating factors except as outlined above   Current Medications, Allergies, Complete Past Medical History, Past Surgical History, Family History, and Social History were reviewed in [employer] record.  ROS  The following are not active complaints unless bolded sore throat, dysphagia, dental problems, itching, sneezing,  nasal congestion or excess/ purulent secretions, ear ache,   fever, chills, sweats, unintended wt loss, pleuritic  or exertional cp, hemoptysis,  orthopnea pnd or leg swelling, presyncope, palpitations, heartburn, abdominal pain, anorexia, nausea, vomiting, diarrhea  or change in bowel or urinary habits, change in stools or urine, dysuria,hematuria,  rash, arthralgias, visual complaints, headache, numbness weakness or ataxia or problems with walking or coordination,  change in mood/affect or memory.     .                 Objective:   Physical Exam  slt hoarse amb wf nad wheelchair bound no wheeze at all  07/11/2013       145  Wt Readings from Last 3 Encounters:  06/13/13 147 lb (66.679 kg)  04/06/13 141 lb (63.957 kg)  01/05/13 140 lb (63.504 kg)       HEENT: nl dentition, turbinates, and orophanx. Nl external ear canals without cough reflex   NECK :  without JVD/Nodes/TM/ nl carotid upstrokes bilaterally   LUNGS: no acc muscle use, clear to A and P bilaterally without cough on insp or exp maneuvers   CV:  RRR  no s3 or murmur or increase in P2, no edema   ABD:  soft and nontender with nl excursion  . No bruits or organomegaly, bowel sounds nl  MS:  warm without deformities, calf tenderness, cyanosis or clubbing  SKIN: warm and dry without lesions    NEURO:  alert, approp, no deficits      CXR  06/13/2013 : Biapical scarring. Mild cardiomegaly. No acute findings.  Assessment & Plan:

## 2013-07-11 NOTE — Patient Instructions (Addendum)
Try off the acid suppression to see what difference if any it makes>  if breathing or cough worse, resume it  Please schedule a follow up visit in 3 months but call sooner if needed  with pfts

## 2013-07-12 NOTE — Assessment & Plan Note (Signed)
Not clear to pt or daughter she's better on gerd rx so ok to stop and see if flares.  No evidence at all of asthma or ild related to RA but still concerned she's at risk of cricoaretenoiditis from RA than mimics asthma and vcd    Each maintenance medication was reviewed in detail including most importantly the difference between maintenance and as needed and under what circumstances the prns are to be used.  Please see instructions for details which were reviewed in writing and the patient given a copy.

## 2013-07-13 ENCOUNTER — Other Ambulatory Visit: Payer: Self-pay | Admitting: Family Medicine

## 2013-07-13 MED ORDER — OXYCODONE-ACETAMINOPHEN 5-325 MG PO TABS: 1.0000 | ORAL_TABLET | Freq: Three times a day (TID) | ORAL | Status: DC | PRN

## 2013-08-11 ENCOUNTER — Other Ambulatory Visit: Payer: Self-pay | Admitting: Family Medicine

## 2013-09-11 ENCOUNTER — Other Ambulatory Visit: Payer: Self-pay | Admitting: Family Medicine

## 2013-09-12 ENCOUNTER — Other Ambulatory Visit: Payer: Self-pay | Admitting: Family Medicine

## 2013-09-13 ENCOUNTER — Telehealth: Payer: Self-pay

## 2013-09-13 MED ORDER — OXYCODONE-ACETAMINOPHEN 5-325 MG PO TABS: 1.0000 | ORAL_TABLET | Freq: Three times a day (TID) | ORAL | Status: DC | PRN

## 2013-09-13 NOTE — Telephone Encounter (Signed)
Percocet refill printed at [facility]; it will be available for pick-up this afternoon from [facility].  Please notify pt's daughter.

## 2013-09-13 NOTE — Telephone Encounter (Signed)
Left a message for patient to return call.

## 2013-09-13 NOTE — Telephone Encounter (Signed)
Notified daughter Rx is at 45 ready.

## 2013-09-13 NOTE — Telephone Encounter (Signed)
Patient's daughter states that she needs a hardcopy of Percocet 325 mg refill. She is driving her mother to [place] this weekend for a surprise birthday party and she really needs to take this medication with them.  Please call Benjamine Mola at [phone]

## 2013-10-05 ENCOUNTER — Encounter: Payer: Self-pay | Admitting: Family Medicine

## 2013-10-05 ENCOUNTER — Ambulatory Visit (INDEPENDENT_AMBULATORY_CARE_PROVIDER_SITE_OTHER): Payer: Medicare Other | Admitting: Family Medicine

## 2013-10-05 VITALS — BP 141/79 | HR 97 | Temp 97.7°F | Resp 20 | Ht 63.0 in | Wt 145.0 lb

## 2013-10-05 DIAGNOSIS — G4700 Insomnia, unspecified: Secondary | ICD-10-CM

## 2013-10-05 DIAGNOSIS — M069 Rheumatoid arthritis, unspecified: Secondary | ICD-10-CM

## 2013-10-05 MED ORDER — ZOLPIDEM TARTRATE 5 MG PO TABS: ORAL_TABLET | ORAL | Status: DC

## 2013-10-05 NOTE — Patient Instructions (Signed)
Contact [employer] medicare supplement to inquire if an in-home assessment can be done. Through [facility], pt may be able to get some in-home services.
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
## 2013-10-23 ENCOUNTER — Other Ambulatory Visit: Payer: Self-pay | Admitting: Family Medicine

## 2013-10-23 MED ORDER — OXYCODONE-ACETAMINOPHEN 5-325 MG PO TABS: 1.0000 | ORAL_TABLET | Freq: Three times a day (TID) | ORAL | Status: DC | PRN

## 2013-10-23 NOTE — Telephone Encounter (Signed)
Oxycodone prescription printed and ready for pick-up from [facility] on Tuesday 10/24/2013.

## 2013-11-12 ENCOUNTER — Other Ambulatory Visit: Payer: Self-pay | Admitting: Family Medicine

## 2013-11-12 NOTE — Telephone Encounter (Signed)
Needs office visit for labs

## 2013-11-28 ENCOUNTER — Other Ambulatory Visit: Payer: Self-pay | Admitting: Family Medicine

## 2013-11-28 MED ORDER — OXYCODONE-ACETAMINOPHEN 5-325 MG PO TABS: 1.0000 | ORAL_TABLET | Freq: Three times a day (TID) | ORAL | Status: DC | PRN

## 2013-12-07 ENCOUNTER — Other Ambulatory Visit: Payer: Self-pay | Admitting: Internal Medicine

## 2013-12-07 DIAGNOSIS — R05 Cough: Secondary | ICD-10-CM

## 2013-12-07 DIAGNOSIS — R059 Cough, unspecified: Secondary | ICD-10-CM

## 2013-12-08 ENCOUNTER — Encounter: Payer: Self-pay | Admitting: Internal Medicine

## 2013-12-08 ENCOUNTER — Ambulatory Visit (INDEPENDENT_AMBULATORY_CARE_PROVIDER_SITE_OTHER): Payer: Medicare Other | Admitting: Internal Medicine

## 2013-12-08 VITALS — BP 118/72 | HR 88 | Temp 97.3°F | Ht 63.0 in | Wt 145.0 lb

## 2013-12-08 DIAGNOSIS — M069 Rheumatoid arthritis, unspecified: Secondary | ICD-10-CM

## 2013-12-08 DIAGNOSIS — R059 Cough, unspecified: Secondary | ICD-10-CM

## 2013-12-08 DIAGNOSIS — R05 Cough: Secondary | ICD-10-CM

## 2013-12-08 NOTE — Patient Instructions (Addendum)
You don't have any lung disease at all   If the noisy breathing returns one issue that needs to be addressed is called cricoarytenoiditis related to rheumatoid arthritis which requires ENT evaluation.  Pulmonary follow up is as needed

## 2013-12-08 NOTE — Progress Notes (Signed)
Subjective:   Patient ID: Patty Walters, female    DOB: 1922-04-30  MRN: [id number]    Brief patient profile:  10  yowf [place] refugee  With RA never smoker remote " bad pneumonia" but no ET  -  recovered completely self referred to pulmonary clinic 06/13/2013 for noisy breathing onset Fall 2014    History of Present Illness: 06/13/2013 1st [facility] office visit/ Sheritta Deeg cc indolent onset, dry cough x one month with noisy breathing x maybe a year- cough is more later in day, no noct cough, really not limited by breathing very very inactive. Feels RA well controlled  rec Pantoprazole (protonix) 40 mg Take 30-60 min before first meal of the day and Pepcid 20 mg one bedtime until return to office - this is the best way to tell whether stomach acid is contributing to your problem.   GERD diet   07/11/2013 f/u ov/Roen Macgowan re: chronic upper airway cough and hoarseness  Chief Complaint  Patient presents with  . Follow-up    Pt states SOB and cough are unchanged since the last visit. No new co's today.    rec Try off acid suppression to see what difference if any this makes   . 12/08/2013 f/u ov/Nastassia Bazaldua re: f/u ? RA lung dz/ cough/ "noisy breathing" Chief Complaint  Patient presents with  . Follow-up    w/ PFT. Cough is almost gone.  Not limited by breathing from desired activities     No obvious day to day or daytime variabilty or assoc  cp or chest tightness, subjective wheeze overt sinus or hb symptoms. No unusual exp hx or h/o childhood pna/ asthma or knowledge of premature birth.  Sleeping ok without nocturnal  or early am exacerbation  of respiratory  c/o's or need for noct saba. Also denies any obvious fluctuation of symptoms with weather or environmental changes or other aggravating or alleviating factors except as outlined above   Current Medications, Allergies, Complete Past Medical History, Past Surgical History, Family History, and Social History were reviewed in  [employer] record.  ROS  The following are not active complaints unless bolded sore throat, dysphagia, dental problems, itching, sneezing,  nasal congestion or excess/ purulent secretions, ear ache,   fever, chills, sweats, unintended wt loss, pleuritic or exertional cp, hemoptysis,  orthopnea pnd or leg swelling, presyncope, palpitations, heartburn, abdominal pain, anorexia, nausea, vomiting, diarrhea  or change in bowel or urinary habits, change in stools or urine, dysuria,hematuria,  rash, arthralgias, visual complaints, headache, numbness weakness or ataxia or problems with walking or coordination,  change in mood/affect or memory.     .           Objective:   Physical Exam  slt hoarse amb wf nad wheelchair bound no wheeze at all  07/11/2013       145 > 12/08/2013  145  Wt Readings from Last 3 Encounters:  06/13/13 147 lb (66.679 kg)  04/06/13 141 lb (63.957 kg)  01/05/13 140 lb (63.504 kg)       HEENT: nl dentition, turbinates, and orophanx. Nl external ear canals without cough reflex   NECK :  without JVD/Nodes/TM/ nl carotid upstrokes bilaterally   LUNGS: no acc muscle use, clear to A and P bilaterally without cough on insp or exp maneuvers   CV:  RRR  no s3 or murmur or increase in P2,  1+ sym LE pitting edema   ABD:  soft and nontender with nl excursion  .  No bruits or organomegaly, bowel sounds nl  MS:  warm without deformities, calf tenderness, cyanosis or clubbing  SKIN: warm and dry without lesions    NEURO:  alert, approp, no deficits        CXR  06/13/2013 : Biapical scarring. Mild cardiomegaly. No acute findings.     Assessment & Plan:

## 2013-12-08 NOTE — Progress Notes (Signed)
PFT done today. 

## 2013-12-09 NOTE — Assessment & Plan Note (Signed)
Resolved s need for maint gerd rx but in event of recurrent cough/hoarseness or "noisy breathing " would restart and in meantime also reviewed gerd diet.  See instructions for specific recommendations which were reviewed directly with the patient who was given a copy with highlighter outlining the key components.

## 2013-12-09 NOTE — Assessment & Plan Note (Signed)
Diagnosed with Rheumatoid Arthritis at age 80. Was treated with Gold initially. Has received steroid joint injections in the past. Current medications: Plaquenil and Cymbalta for pain.  May have received  Reclast injection. - PFTs wnl 12/08/13   I had an extended summary discussion with the patient /daugher today lasting 15 to 20 minutes of a 25 minute visit on the following issues:  No evidence at all of RA lung dz in any form. The "noisy breathing" is somewhat suggestive of VCD from either GERD or CA (crico - arytenoiditis which can be seen in RA) > if recurs ENT next step  Pulmonary f/u is prn

## 2013-12-11 ENCOUNTER — Other Ambulatory Visit: Payer: Self-pay | Admitting: Physician Assistant

## 2013-12-18 LAB — PULMONARY FUNCTION TEST
DL/VA % pred: 69 %
DL/VA: 3.25 ml/min/mmHg/L
DLCO UNC: 17.19 ml/min/mmHg
DLCO unc % pred: 75 %
FEF 25-75 Post: 1.4 L/sec
FEF 25-75 Pre: 1.07 L/sec
FEF2575-%Change-Post: 31 %
FEF2575-%PRED-POST: 173 %
FEF2575-%Pred-Pre: 131 %
FEV1-%CHANGE-POST: 7 %
FEV1-%PRED-POST: 93 %
FEV1-%Pred-Pre: 86 %
FEV1-PRE: 1.27 L
FEV1-Post: 1.37 L
FEV1FVC-%Change-Post: 4 %
FEV1FVC-%PRED-PRE: 107 %
FEV6-%Change-Post: 3 %
FEV6-%PRED-PRE: 87 %
FEV6-%Pred-Post: 90 %
FEV6-Post: 1.7 L
FEV6-Pre: 1.64 L
FEV6FVC-%Change-Post: 0 %
FEV6FVC-%PRED-PRE: 106 %
FEV6FVC-%Pred-Post: 107 %
FVC-%Change-Post: 2 %
FVC-%PRED-PRE: 82 %
FVC-%Pred-Post: 84 %
FVC-PRE: 1.65 L
FVC-Post: 1.7 L
PRE FEV6/FVC RATIO: 99 %
Post FEV1/FVC ratio: 81 %
Post FEV6/FVC ratio: 100 %
Pre FEV1/FVC ratio: 77 %

## 2014-01-04 ENCOUNTER — Encounter: Payer: Self-pay | Admitting: Family Medicine

## 2014-01-04 ENCOUNTER — Ambulatory Visit (INDEPENDENT_AMBULATORY_CARE_PROVIDER_SITE_OTHER): Payer: Medicare Other | Admitting: Family Medicine

## 2014-01-04 VITALS — BP 122/68 | HR 82 | Temp 97.4°F | Resp 16 | Ht 62.0 in | Wt 144.0 lb

## 2014-01-04 DIAGNOSIS — D649 Anemia, unspecified: Secondary | ICD-10-CM

## 2014-01-04 DIAGNOSIS — Z0000 Encounter for general adult medical examination without abnormal findings: Secondary | ICD-10-CM

## 2014-01-04 DIAGNOSIS — Z139 Encounter for screening, unspecified: Secondary | ICD-10-CM

## 2014-01-04 DIAGNOSIS — R69 Illness, unspecified: Secondary | ICD-10-CM

## 2014-01-04 DIAGNOSIS — H01006 Unspecified blepharitis left eye, unspecified eyelid: Secondary | ICD-10-CM

## 2014-01-04 DIAGNOSIS — H01009 Unspecified blepharitis unspecified eye, unspecified eyelid: Secondary | ICD-10-CM

## 2014-01-04 DIAGNOSIS — H01003 Unspecified blepharitis right eye, unspecified eyelid: Secondary | ICD-10-CM

## 2014-01-04 DIAGNOSIS — R609 Edema, unspecified: Secondary | ICD-10-CM

## 2014-01-04 DIAGNOSIS — R600 Localized edema: Secondary | ICD-10-CM

## 2014-01-04 LAB — COMPREHENSIVE METABOLIC PANEL
ALT: 15 U/L (ref 0–35)
AST: 22 U/L (ref 0–37)
Albumin: 4.1 g/dL (ref 3.5–5.2)
Alkaline Phosphatase: 58 U/L (ref 39–117)
BILIRUBIN TOTAL: 0.5 mg/dL (ref 0.2–1.2)
BUN: 26 mg/dL — ABNORMAL HIGH (ref 6–23)
CO2: 24 mEq/L (ref 19–32)
CREATININE: 1.02 mg/dL (ref 0.50–1.10)
Calcium: 9.4 mg/dL (ref 8.4–10.5)
Chloride: 106 mEq/L (ref 96–112)
Glucose, Bld: 84 mg/dL (ref 70–99)
Potassium: 4.8 mEq/L (ref 3.5–5.3)
Sodium: 141 mEq/L (ref 135–145)
TOTAL PROTEIN: 7.1 g/dL (ref 6.0–8.3)

## 2014-01-04 LAB — CBC WITH DIFFERENTIAL/PLATELET
BASOS PCT: 1 % (ref 0–1)
Basophils Absolute: 0.1 10*3/uL (ref 0.0–0.1)
EOS ABS: 0.2 10*3/uL (ref 0.0–0.7)
Eosinophils Relative: 2 % (ref 0–5)
HCT: 32 % — ABNORMAL LOW (ref 36.0–46.0)
Hemoglobin: 10.7 g/dL — ABNORMAL LOW (ref 12.0–15.0)
LYMPHS ABS: 1.4 10*3/uL (ref 0.7–4.0)
Lymphocytes Relative: 18 % (ref 12–46)
MCH: 27.7 pg (ref 26.0–34.0)
MCHC: 33.4 g/dL (ref 30.0–36.0)
MCV: 82.9 fL (ref 78.0–100.0)
Monocytes Absolute: 0.5 10*3/uL (ref 0.1–1.0)
Monocytes Relative: 7 % (ref 3–12)
Neutro Abs: 5.5 10*3/uL (ref 1.7–7.7)
Neutrophils Relative %: 72 % (ref 43–77)
PLATELETS: 318 10*3/uL (ref 150–400)
RBC: 3.86 MIL/uL — AB (ref 3.87–5.11)
RDW: 14.5 % (ref 11.5–15.5)
WBC: 7.6 10*3/uL (ref 4.0–10.5)

## 2014-01-04 MED ORDER — DULOXETINE HCL 60 MG PO CPEP: ORAL_CAPSULE | ORAL | Status: DC

## 2014-01-04 MED ORDER — OXYCODONE-ACETAMINOPHEN 5-325 MG PO TABS: 1.0000 | ORAL_TABLET | Freq: Three times a day (TID) | ORAL | Status: DC | PRN

## 2014-01-04 MED ORDER — ZOLPIDEM TARTRATE 5 MG PO TABS: ORAL_TABLET | ORAL | Status: DC

## 2014-01-04 MED ORDER — HYDROXYCHLOROQUINE SULFATE 200 MG PO TABS: ORAL_TABLET | ORAL | Status: DC

## 2014-01-04 NOTE — Progress Notes (Signed)
Subjective:    Patient ID: Patty Walters, female    DOB: Dec 19, 1921, 80 y.o.   MRN: [id number]  HPI  This 80 y.o. Cauc female is [facility]; she is accompanied by her daughter, [name]. Pt has been stable w/o new complaints. She is fairly sedentary but has a good appetite and sleeps well. Chronic lower ext edema responds to elevation when pt practices this daily. Daughter plans to purchase some compression hose w/ zippers for ease of getting on pt. No recent falls.  Patient Active Problem List   Diagnosis Date Noted  . Cough 06/13/2013  . Chronic anemia 02/29/2012  . Insomnia 09/10/2011  . Risk for falls 09/10/2011  . Neuropathy 09/10/2011  . Rheumatoid arthritis(714.0) 09/10/2011   Prior to Admission medications   Medication Sig Start Date End Date Taking? Authorizing Provider  Calcium Carbonate-Vitamin D (CALTRATE 600+D) 600-400 MG-UNIT per tablet Take 1 tablet by mouth daily.   Yes Historical Provider, MD  docusate sodium (COLACE) 100 MG capsule Take 100 mg by mouth daily.   Yes Historical Provider, MD  DULoxetine (CYMBALTA) 60 MG capsule TAKE 1 CAPSULE (60 MG TOTAL) BY MOUTH DAILY.   Yes Barton Fanny, MD  furosemide (LASIX) 20 MG tablet TAKE 1/2 TO 1 TABLET DAILY IF NEEDED FOR SWELLING   Yes Eleanore E Egan, PA-C  hydroxychloroquine (PLAQUENIL) 200 MG tablet TAKE 1 TABLET (200 MG TOTAL) BY MOUTH DAILY.   Yes Barton Fanny, MD  Multiple Vitamin (MULTIVITAMIN) tablet Take 1 tablet by mouth daily.   Yes Historical Provider, MD  oxyCODONE-acetaminophen (PERCOCET/ROXICET) 5-325 MG per tablet Take 1 tablet by mouth every 8 (eight) hours as needed.   Yes Barton Fanny, MD  zolpidem (AMBIEN) 5 MG tablet TAKE 1 TABLET AT BEDTIME FOR INSOMNIA   Yes Barton Fanny, MD    PMHx, Surg Hx, Soc and Fam Hx reviewed.   Review of Systems  Constitutional: Negative.   HENT: Negative.   Eyes: Positive for photophobia and redness.       Has been diagnosed  w/ blepharitis; [name] requests referral to different ophthalmologist (not Publishing copy).  Respiratory: Positive for shortness of breath. Negative for cough, chest tightness and wheezing.        Evaluated by Dr. Melvyn Novas- normal PFTs.  Cardiovascular: Positive for leg swelling. Negative for chest pain and palpitations.       Chronic- Daughter, [name], reports that pt are not elevating nor wearing compression hose regularly.  Gastrointestinal: Negative.   Endocrine: Negative.   Genitourinary: Negative.   Musculoskeletal: Positive for arthralgias, joint swelling and myalgias.       Pt has Rheumatoid Arthritis.  Skin: Positive for color change.       Lower legs are chronically swollen and have venous stasis redness.  Allergic/Immunologic: Positive for environmental allergies.  Neurological: Negative.   Hematological: Negative.   Psychiatric/Behavioral: Positive for sleep disturbance.       Takes Zolpidem nightly.      Objective:   Physical Exam  Nursing note and vitals reviewed. Constitutional: She is oriented to person, place, and time. Vital signs are normal. She appears well-developed and well-nourished. No distress.  HENT:  Head: Normocephalic and atraumatic.  Right Ear: Hearing, external ear and ear canal normal. Tympanic membrane is scarred.  Left Ear: Hearing, external ear and ear canal normal. Tympanic membrane is scarred.  Nose: Nasal deformity present. No mucosal edema or septal deviation.  Mouth/Throat: Uvula is midline, oropharynx is clear  and moist and mucous membranes are normal. No oral lesions. No dental caries.  Eyes: EOM and lids are normal. Pupils are equal, round, and reactive to light. Right conjunctiva is not injected. Left conjunctiva is not injected. No scleral icterus.  Palpebral conjunctivae injected.  Neck: Trachea normal. Neck supple. No JVD present. No spinous process tenderness and no muscular tenderness present. Carotid bruit is not present. Decreased range  of motion present. No mass and no thyromegaly present.  Cardiovascular: Normal rate, regular rhythm, S1 normal, S2 normal and normal heart sounds.   Occasional extrasystoles are present. PMI is not displaced.  Exam reveals no gallop and no friction rub.   No murmur heard. Pulses:      Carotid pulses are 1+ on the right side, and 1+ on the left side.      Radial pulses are 2+ on the right side, and 2+ on the left side.       Femoral pulses are 2+ on the right side, and 2+ on the left side.      Dorsalis pedis pulses are 1+ on the right side, and 1+ on the left side.       Posterior tibial pulses are 2+ on the right side, and 2+ on the left side.  Pulmonary/Chest: Effort normal and breath sounds normal. No respiratory distress. She has no decreased breath sounds. She has no wheezes. She has no rales. Right breast exhibits no inverted nipple, no mass, no nipple discharge, no skin change and no tenderness.  Absent L breast; well healed post- mastectomy scar.  Abdominal: Soft. Normal appearance, normal aorta and bowel sounds are normal. She exhibits no distension, no abdominal bruit and no mass. There is no hepatosplenomegaly. There is no tenderness. There is no guarding.  Genitourinary:  Deferred (known hx of hemorrhoids).  Musculoskeletal:       Cervical back: She exhibits decreased range of motion and tenderness. She exhibits no bony tenderness, no deformity, no pain and no spasm.       Thoracic back: She exhibits tenderness. She exhibits normal range of motion, no deformity and no pain.       Lumbar back: She exhibits decreased range of motion and tenderness. She exhibits no bony tenderness, no deformity, no pain and no spasm.       Right lower leg: She exhibits tenderness and edema.       Left lower leg: She exhibits tenderness and edema.  Degenerative changes in all major joints; ROM good but w/ stiffness and mild bony tenderness.  Lymphadenopathy:       Head (right side): No submental, no  submandibular, no tonsillar, no posterior auricular and no occipital adenopathy present.       Head (left side): No submental, no submandibular, no tonsillar, no posterior auricular and no occipital adenopathy present.    She has no cervical adenopathy.    She has no axillary adenopathy.       Right: No inguinal and no supraclavicular adenopathy present.       Left: No inguinal and no supraclavicular adenopathy present.  Neurological: She is alert and oriented to person, place, and time. She has normal strength. She displays no tremor. No cranial nerve deficit or sensory deficit. She exhibits normal muscle tone. Gait abnormal. Coordination normal.  Ambulates w/ walker; shuffling gait.  Skin: Skin is warm and dry. Abrasion and lesion noted. No ecchymosis and no rash noted. She is not diaphoretic. There is cyanosis. No pallor. Nails show no clubbing.  Abrasion on R temple caused by eye glasses. Well healed scars and erythema of lower extremities/tips of digits cool to touch and cyanotic discoloration (improved w/ elevation of legs).  Psychiatric: She has a normal mood and affect. Her speech is normal and behavior is normal. Judgment and thought content normal.  Mild impairment of cognition and memory.      Assessment & Plan:  Routine general medical examination at a health care facility  Chronic anemia - Plan: CBC with Differential  Taking medication for chronic disease - Continue medication for RA and chronic pain. Plan: Comprehensive metabolic panel  Lower extremity edema -Encouraged elevation and compression hose. Uses Furosemide prn.  Plan: Comprehensive metabolic panel  Blepharitis of both eyes - Plan: Ambulatory referral to Ophthalmology (Dr. Gershon Crane).  Meds ordered this encounter  Medications  . DULoxetine (CYMBALTA) 60 MG capsule    Sig: TAKE 1 CAPSULE (60 MG TOTAL) BY MOUTH DAILY.    Dispense:  30 capsule    Refill:  5  . hydroxychloroquine (PLAQUENIL) 200 MG tablet    Sig:  TAKE 1 TABLET (200 MG TOTAL) BY MOUTH DAILY.    Dispense:  30 tablet    Refill:  5  . zolpidem (AMBIEN) 5 MG tablet    Sig: TAKE 1 TABLET AT BEDTIME FOR INSOMNIA    Dispense:  30 tablet    Refill:  5  . DISCONTD: oxyCODONE-acetaminophen (PERCOCET/ROXICET) 5-325 MG per tablet    Sig: Take 1 tablet by mouth every 8 (eight) hours as needed.    Dispense:  100 tablet    Refill:  0  . oxyCODONE-acetaminophen (PERCOCET/ROXICET) 5-325 MG per tablet    Sig: Take 1 tablet by mouth every 8 (eight) hours as needed.    Dispense:  100 tablet    Refill:  0    May fill on or after February 03, 2014.    Discussed Prevnar 13 vaccine since pt had Pneumovax in 2005.

## 2014-01-04 NOTE — Patient Instructions (Signed)

## 2014-03-07 ENCOUNTER — Other Ambulatory Visit: Payer: Self-pay | Admitting: Family Medicine

## 2014-03-08 MED ORDER — OXYCODONE-ACETAMINOPHEN 5-325 MG PO TABS
1.0000 | ORAL_TABLET | Freq: Three times a day (TID) | ORAL | Status: DC | PRN
Start: 2014-03-08 — End: 2014-04-16

## 2014-03-08 NOTE — Telephone Encounter (Signed)
Oxycodone refill was printed and MyChart message sent; RX can be picked up at [address] [facility] on Friday, Mar 09, 2014.

## 2014-04-16 ENCOUNTER — Other Ambulatory Visit: Payer: Self-pay | Admitting: Family Medicine

## 2014-04-18 MED ORDER — OXYCODONE-ACETAMINOPHEN 5-325 MG PO TABS: 1.0000 | ORAL_TABLET | Freq: Three times a day (TID) | ORAL | Status: DC | PRN

## 2014-05-04 ENCOUNTER — Telehealth: Payer: Self-pay

## 2014-05-04 MED ORDER — FUROSEMIDE 20 MG PO TABS: ORAL_TABLET | ORAL | Status: DC

## 2014-05-04 NOTE — Telephone Encounter (Signed)
Sent in #30. Advised daughter- pt needs to follow up. Pt has an appt on Tuesday.

## 2014-05-04 NOTE — Telephone Encounter (Signed)
Patients daughter calling to get a refill on her mothers 'Furosemide 20mg " medication. Patient is completely out and per daughter [facility] told [facility] our they contacted [place] and we never responded. i informed daughter I didn't see any communication from [facility] and I would send a message to the nurse for her. [facility] [address] and daughters call back number is [phone]

## 2014-05-08 ENCOUNTER — Ambulatory Visit: Payer: Medicare Other | Admitting: Family Medicine

## 2014-05-15 ENCOUNTER — Ambulatory Visit (INDEPENDENT_AMBULATORY_CARE_PROVIDER_SITE_OTHER): Payer: Medicare Other | Admitting: Family Medicine

## 2014-05-15 ENCOUNTER — Encounter: Payer: Self-pay | Admitting: Family Medicine

## 2014-05-15 VITALS — BP 122/70 | HR 104 | Temp 98.0°F | Resp 18 | Wt 145.0 lb

## 2014-05-15 DIAGNOSIS — Z23 Encounter for immunization: Secondary | ICD-10-CM

## 2014-05-15 DIAGNOSIS — R609 Edema, unspecified: Secondary | ICD-10-CM

## 2014-05-15 MED ORDER — FUROSEMIDE 20 MG PO TABS: ORAL_TABLET | ORAL | Status: DC

## 2014-05-15 NOTE — Patient Instructions (Signed)
Prevar13 would be appropriate for you to have because you are considered immunocompromised due to advanced age.  You can return in 52 month for [place]; it will be an immunization only visit and you do not need an appointment.

## 2014-05-16 ENCOUNTER — Encounter: Payer: Self-pay | Admitting: Family Medicine

## 2014-05-16 NOTE — Progress Notes (Signed)
S: This 80 y.o. 63 female returns with her daughter, Patty Walters, for follow-up. Since last visit, pt has traveled to see family and went to the movies. Pt has no new concerns; Patty Walters is concerned about pt's lack of mobility and lower extremity edema. Lower legs have minimal swelling early in the day but swelling increases as day progresses. Pt sits in recliner most of day; she does elevate feet for brief periods. Patty Walters encourages pt to walk in the hallway upstairs in their home. Pt has difficulty climbing stairs; she has to climb several steps to a landing then proceed up to 2nd floor. Pt has RA and hx of bilateral hip fractures. Patty Walters wants pt to resume some leg exercises that were prescribed during rehab.  She has no c/o of increased fatigue, SOB or DOE, cough, CP or tightness, palpitations, numbness, increased weakness or syncope. Pt is taking furosemide daily.  Patient Active Problem List   Diagnosis Date Noted  . Cough 06/13/2013  . Chronic anemia 02/29/2012  . Insomnia 09/10/2011  . Risk for falls 09/10/2011  . Neuropathy 09/10/2011  . Rheumatoid arthritis(714.0) 09/10/2011    Prior to Admission medications   Medication Sig Start Date End Date Taking? Authorizing Provider  Calcium Carbonate-Vitamin D (CALTRATE 600+D) 600-400 MG-UNIT per tablet Take 1 tablet by mouth daily.   Yes Historical Provider, MD  docusate sodium (COLACE) 100 MG capsule Take 100 mg by mouth daily.   Yes Historical Provider, MD  DULoxetine (CYMBALTA) 60 MG capsule TAKE 1 CAPSULE (60 MG TOTAL) BY MOUTH DAILY. 01/04/14  Yes Barton Fanny, MD  furosemide (LASIX) 20 MG tablet TAKE 1/2 TO 1 TABLET DAILY.   Yes Barton Fanny, MD  hydroxychloroquine (PLAQUENIL) 200 MG tablet TAKE 1 TABLET (200 MG TOTAL) BY MOUTH DAILY. 01/04/14  Yes Barton Fanny, MD  Multiple Vitamin (MULTIVITAMIN) tablet Take 1 tablet by mouth daily.   Yes Historical Provider, MD  oxyCODONE-acetaminophen (PERCOCET/ROXICET) 5-325 MG per tablet Take 1  tablet by mouth every 8 (eight) hours as needed. 04/18/14  Yes Barton Fanny, MD  zolpidem (AMBIEN) 5 MG tablet TAKE 1 TABLET AT BEDTIME FOR INSOMNIA 01/04/14  Yes Barton Fanny, MD    [facility] and FAM Hx reviewed.  ROS: As per HPI.  O: Filed Vitals:   05/15/14 1615  BP: 122/70  Pulse: 104  Temp: 98 F (36.7 C)  Resp: 18    GEN: In NAD; WN,WD. Appears stated age. Weight is stable. HENT: [place]/AT; wears corrective lenses. Band-aid covering shallow abrasion on R temple. EOMI w/ clear conj/sclerae. Otherwise unremarkable. COR: RRR.  LUNGS: Unlabored resp. MS: MAEs; R >> L pre-tibial edema. Chronic joint changes c/w RA. NEURO: A&O x 3; CNs intact. Exam at baseline. Pt ambulates w/ walker; gait is slow and she is stooped.   A/P: Dependent edema- Reviewed exercises with pt; knee extensions 5 times on each leg with foot flexion/extension to work calf muscles. She will gradually increase to count of 10 per leg over next 6-8 weeks. Pt voices understanding. Continue furosemide. Pt will resume compression hose during fall and winter months.  Need for prophylactic vaccination and inoculation against influenza - Plan: Flu Vaccine QUAD 36+ mos IM  Patty Walters will bring pt back in 1-2 months for Prevnar13.

## 2014-06-05 ENCOUNTER — Other Ambulatory Visit: Payer: Self-pay | Admitting: Family Medicine

## 2014-06-07 MED ORDER — OXYCODONE-ACETAMINOPHEN 5-325 MG PO TABS: 1.0000 | ORAL_TABLET | Freq: Three times a day (TID) | ORAL | Status: DC | PRN

## 2014-06-07 NOTE — Telephone Encounter (Signed)
Oxycodone- APAP refill- RX printed and is at [address] for pick-up.

## 2014-06-30 ENCOUNTER — Other Ambulatory Visit: Payer: Self-pay | Admitting: Family Medicine

## 2014-07-05 ENCOUNTER — Other Ambulatory Visit: Payer: Self-pay | Admitting: Family Medicine

## 2014-07-10 ENCOUNTER — Other Ambulatory Visit: Payer: Self-pay | Admitting: Family Medicine

## 2014-07-11 MED ORDER — OXYCODONE-ACETAMINOPHEN 5-325 MG PO TABS: 1.0000 | ORAL_TABLET | Freq: Three times a day (TID) | ORAL | Status: DC | PRN

## 2014-07-25 ENCOUNTER — Ambulatory Visit (INDEPENDENT_AMBULATORY_CARE_PROVIDER_SITE_OTHER): Payer: Medicare Other | Admitting: Radiology

## 2014-07-25 DIAGNOSIS — Z23 Encounter for immunization: Secondary | ICD-10-CM

## 2014-08-10 ENCOUNTER — Other Ambulatory Visit: Payer: Self-pay | Admitting: Family Medicine

## 2014-08-12 MED ORDER — OXYCODONE-ACETAMINOPHEN 5-325 MG PO TABS: 1.0000 | ORAL_TABLET | Freq: Three times a day (TID) | ORAL | Status: DC | PRN

## 2014-09-04 ENCOUNTER — Other Ambulatory Visit: Payer: Self-pay | Admitting: Family Medicine

## 2014-09-17 ENCOUNTER — Other Ambulatory Visit: Payer: Self-pay | Admitting: Family Medicine

## 2014-09-18 MED ORDER — OXYCODONE-ACETAMINOPHEN 5-325 MG PO TABS: 1.0000 | ORAL_TABLET | Freq: Three times a day (TID) | ORAL | Status: DC | PRN

## 2014-09-19 ENCOUNTER — Telehealth: Payer: Self-pay | Admitting: Family Medicine

## 2014-09-19 NOTE — Telephone Encounter (Signed)
lmom on daughters machine to pick mothers RX up from pharmacy

## 2014-09-19 NOTE — Telephone Encounter (Signed)
-----   Message from Barton Fanny, MD sent at 09/18/2014  6:11 PM EST ----- Contact pt's daughter to let her know that prescription is ready for pick-up at [address].  Thanks.

## 2014-10-08 ENCOUNTER — Other Ambulatory Visit: Payer: Self-pay | Admitting: Family Medicine

## 2014-10-13 ENCOUNTER — Other Ambulatory Visit: Payer: Self-pay | Admitting: Family Medicine

## 2014-10-15 ENCOUNTER — Telehealth: Payer: Self-pay

## 2014-10-15 NOTE — Telephone Encounter (Signed)
Daughter Patty Walters would like a call back from  Dr Leward Quan in regard to skin cancer and path to take?Marland Kitchen   Best phone for Patty Walters is [phone]

## 2014-10-18 NOTE — Telephone Encounter (Signed)
Called pt's daughter, Left message for pt to call back.

## 2014-10-19 ENCOUNTER — Encounter: Payer: Self-pay | Admitting: *Deleted

## 2014-10-22 NOTE — Telephone Encounter (Signed)
Went to derm 1.5 weeks ago.  Diagnosed with malignant spindle cell neoplasm. Has apt. 10/24/2014 to have this removed at the skin surgery center. Patty Walters will have her records sent to [facility] to update our records.   If you have any other suggestions or reccommendations for her, she would like a call back. Otherwise, she just only wanted to keep you informed.

## 2014-10-22 NOTE — Telephone Encounter (Signed)
Please advise 

## 2014-10-24 ENCOUNTER — Other Ambulatory Visit: Payer: Self-pay | Admitting: Family Medicine

## 2014-10-25 ENCOUNTER — Other Ambulatory Visit: Payer: Self-pay | Admitting: Family Medicine

## 2014-10-25 ENCOUNTER — Encounter: Payer: Self-pay | Admitting: Family Medicine

## 2014-10-25 MED ORDER — OXYCODONE-ACETAMINOPHEN 5-325 MG PO TABS: 1.0000 | ORAL_TABLET | Freq: Three times a day (TID) | ORAL | Status: DC | PRN

## 2014-11-04 ENCOUNTER — Other Ambulatory Visit: Payer: Self-pay | Admitting: Family Medicine

## 2014-12-04 ENCOUNTER — Other Ambulatory Visit: Payer: Self-pay | Admitting: Family Medicine

## 2014-12-04 MED ORDER — DULOXETINE HCL 60 MG PO CPEP: 60.0000 mg | ORAL_CAPSULE | Freq: Every day | ORAL | Status: DC

## 2014-12-04 MED ORDER — OXYCODONE-ACETAMINOPHEN 5-325 MG PO TABS: 1.0000 | ORAL_TABLET | Freq: Three times a day (TID) | ORAL | Status: DC | PRN

## 2014-12-04 NOTE — Addendum Note (Signed)
Addended by: Ellsworth Lennox B on: 12/04/2014 06:18 PM   Modules accepted: Orders

## 2014-12-07 ENCOUNTER — Ambulatory Visit: Payer: Medicare Other | Admitting: Family Medicine

## 2014-12-11 ENCOUNTER — Ambulatory Visit (INDEPENDENT_AMBULATORY_CARE_PROVIDER_SITE_OTHER): Payer: Medicare Other | Admitting: Family Medicine

## 2014-12-11 ENCOUNTER — Encounter: Payer: Self-pay | Admitting: Family Medicine

## 2014-12-11 VITALS — BP 138/68 | HR 94 | Temp 98.0°F | Resp 16 | Ht 64.5 in | Wt 144.8 lb

## 2014-12-11 DIAGNOSIS — Z79899 Other long term (current) drug therapy: Secondary | ICD-10-CM

## 2014-12-11 DIAGNOSIS — D638 Anemia in other chronic diseases classified elsewhere: Secondary | ICD-10-CM

## 2014-12-11 DIAGNOSIS — R609 Edema, unspecified: Secondary | ICD-10-CM

## 2014-12-11 DIAGNOSIS — Z760 Encounter for issue of repeat prescription: Secondary | ICD-10-CM | POA: Diagnosis not present

## 2014-12-11 DIAGNOSIS — Z0000 Encounter for general adult medical examination without abnormal findings: Secondary | ICD-10-CM

## 2014-12-11 LAB — COMPLETE METABOLIC PANEL WITH GFR
ALBUMIN: 3.8 g/dL (ref 3.5–5.2)
ALT: 15 U/L (ref 0–35)
AST: 21 U/L (ref 0–37)
Alkaline Phosphatase: 65 U/L (ref 39–117)
BUN: 24 mg/dL — AB (ref 6–23)
CO2: 23 meq/L (ref 19–32)
Calcium: 9 mg/dL (ref 8.4–10.5)
Chloride: 105 mEq/L (ref 96–112)
Creat: 0.95 mg/dL (ref 0.50–1.10)
GFR, EST AFRICAN AMERICAN: 61 mL/min
GFR, EST NON AFRICAN AMERICAN: 53 mL/min — AB
Glucose, Bld: 91 mg/dL (ref 70–99)
Potassium: 4.5 mEq/L (ref 3.5–5.3)
Sodium: 139 mEq/L (ref 135–145)
TOTAL PROTEIN: 7 g/dL (ref 6.0–8.3)
Total Bilirubin: 0.3 mg/dL (ref 0.2–1.2)

## 2014-12-11 LAB — CBC WITH DIFFERENTIAL/PLATELET
BASOS PCT: 1 % (ref 0–1)
Basophils Absolute: 0.1 10*3/uL (ref 0.0–0.1)
Eosinophils Absolute: 0.1 10*3/uL (ref 0.0–0.7)
Eosinophils Relative: 2 % (ref 0–5)
HCT: 32.3 % — ABNORMAL LOW (ref 36.0–46.0)
Hemoglobin: 10.7 g/dL — ABNORMAL LOW (ref 12.0–15.0)
Lymphocytes Relative: 18 % (ref 12–46)
Lymphs Abs: 1.1 10*3/uL (ref 0.7–4.0)
MCH: 27.9 pg (ref 26.0–34.0)
MCHC: 33.1 g/dL (ref 30.0–36.0)
MCV: 84.3 fL (ref 78.0–100.0)
MONO ABS: 0.4 10*3/uL (ref 0.1–1.0)
MONOS PCT: 6 % (ref 3–12)
MPV: 8.5 fL — ABNORMAL LOW (ref 8.6–12.4)
Neutro Abs: 4.5 10*3/uL (ref 1.7–7.7)
Neutrophils Relative %: 73 % (ref 43–77)
Platelets: 308 10*3/uL (ref 150–400)
RBC: 3.83 MIL/uL — ABNORMAL LOW (ref 3.87–5.11)
RDW: 15.2 % (ref 11.5–15.5)
WBC: 6.2 10*3/uL (ref 4.0–10.5)

## 2014-12-11 MED ORDER — FUROSEMIDE 20 MG PO TABS: ORAL_TABLET | ORAL | Status: DC

## 2014-12-11 MED ORDER — OXYCODONE-ACETAMINOPHEN 5-325 MG PO TABS: 1.0000 | ORAL_TABLET | Freq: Three times a day (TID) | ORAL | Status: DC | PRN

## 2014-12-11 MED ORDER — HYDROXYCHLOROQUINE SULFATE 200 MG PO TABS: ORAL_TABLET | ORAL | Status: DC

## 2014-12-11 NOTE — Patient Instructions (Signed)
I have recommended that you transition to [facility] (ph# [phone]).

## 2014-12-12 ENCOUNTER — Other Ambulatory Visit: Payer: Self-pay | Admitting: Family Medicine

## 2014-12-12 NOTE — Progress Notes (Signed)
Subjective:    Patient ID: Patty Walters, female    DOB: 01-Feb-1922, 81 y.o.   MRN: [id number]  HPI  This 81 y.o female is accompanied by her daughter, Patty Walters and is here for 31-month follow-up. Pt celebrated her 81st birthday in [place] where her sister and other family members reside. Pt has chronic LEE, treated w/ compression hose and prn furosemide. Pt also elevates her legs in a recliner at least twice daily. The edema is at baseline; pt nor daughter reports any redness or calf pain.  Pt has Rheumatoid Arthritis, stable on current medications (hydroxychloroquine, duloxetine -for pain and depression- and oxycodone- acetaminophen for chronic pain). These medications allow pt to maintain mobility and to sleep well. She no longer takes Ambien as daughter thought that this med contributed pt's falls in the past.  Pt has anemia related to chronic disease, medication and advanced age. Appetite is very good and has not complained of increased fatigue, dizziness, lightheadedness, palpitations, DOE or syncope.  Health care maintenance- We had discussion today about transition of care to a new provider in my absence. Daughter is in agreement that having pt establish care at [facility] would be very appropriate. She will contact that facility to schedule initial intake evaluation.  Patient Active Problem List   Diagnosis Date Noted  . Skin cancer 10/25/2014  . Cough 06/13/2013  . Chronic anemia 02/29/2012  . Insomnia 09/10/2011  . Risk for falls 09/10/2011  . Neuropathy 09/10/2011  . Rheumatoid arthritis(714.0) 09/10/2011    Prior to Admission medications   Medication Sig Start Date End Date Taking? Authorizing Provider  Calcium Carbonate-Vitamin D (CALTRATE 600+D) 600-400 MG-UNIT per tablet Take 1 tablet by mouth daily.   Yes Historical Provider, MD  docusate sodium (COLACE) 100 MG capsule Take 100 mg by mouth daily.   Yes Historical Provider, MD  DULoxetine  (CYMBALTA) 60 MG capsule Take 1 capsule (60 mg total) by mouth daily. 12/04/14  Yes Barton Fanny, MD  furosemide (LASIX) 20 MG tablet TAKE 1/2 TO 1 TABLET BY MOUTH EVERY DAY   Yes Barton Fanny, MD  hydroxychloroquine (PLAQUENIL) 200 MG tablet TAKE 1 TABLET (200 MG TOTAL) BY MOUTH DAILY.   Yes Barton Fanny, MD  Multiple Vitamin (MULTIVITAMIN) tablet Take 1 tablet by mouth daily.   Yes Historical Provider, MD  OVER THE COUNTER MEDICATION as needed.   Yes Historical Provider, MD  oxyCODONE-acetaminophen (PERCOCET/ROXICET) 5-325 MG per tablet Take 1 tablet by mouth every 8 (eight) hours as needed.   Yes Barton Fanny, MD    SURG, SOC and FAM HX reviewed.   Review of Systems As per HPI. Pt has had skin cancer and lesion removed by plastic surgeon. He noted some pre-auricular nodules on R and plans to biopsy them at next visit; there is family hx of melanoma.     Objective:   Physical Exam  Constitutional: She is oriented to person, place, and time. Vital signs are normal. She appears well-developed and well-nourished. No distress.  Blood pressure 138/68, pulse 94, temperature 98 F (36.7 C), temperature source Oral, resp. rate 16, height 5' 4.5" (1.638 m), weight 144 lb 12.8 oz (65.681 kg), SpO2 94 %.   HENT:  Head: Normocephalic and atraumatic.  Right Ear: External ear normal.  Left Ear: External ear normal.  Mouth/Throat: Oropharynx is clear and moist.  Eyes: Conjunctivae and EOM are normal. No scleral icterus.  Neck: Normal range of motion.  Neck supple.  Cardiovascular: Normal rate and regular rhythm.   Pulmonary/Chest: Effort normal. No respiratory distress.  Musculoskeletal: She exhibits edema. She exhibits no tenderness.  Lymphadenopathy:       Head (right side): Preauricular adenopathy present.       Head (left side): No preauricular adenopathy present.  Neurological: She is alert and oriented to person, place, and time. No cranial nerve deficit.  Coordination normal.  Skin: Skin is warm and dry. She is not diaphoretic. No pallor.  Psychiatric: She has a normal mood and affect. Her behavior is normal. Thought content normal.  Nursing note and vitals reviewed.     Assessment & Plan:  Chronic edema - Continue compression hose, elevation bid and furosemide prn. Plan: COMPLETE METABOLIC PANEL WITH GFR, Ambulatory referral to Internal Medicine  Medication refill  Encounter for long-term (current) use of high-risk medication - Plan: CBC with Differential/Platelet, COMPLETE METABOLIC PANEL WITH GFR  Anemia, chronic disease - Plan: CBC with Differential/Platelet, Ambulatory referral to Internal Medicine [facility])  Health care maintenance - Plan: Ambulatory referral to Internal Medicine  Meds ordered this encounter  Medications  . hydroxychloroquine (PLAQUENIL) 200 MG tablet    Sig: TAKE 1 TABLET (200 MG TOTAL) BY MOUTH DAILY.    Dispense:  30 tablet    Refill:  5  . furosemide (LASIX) 20 MG tablet    Sig: TAKE 1/2 TO 1 TABLET BY MOUTH EVERY DAY    Dispense:  30 tablet    Refill:  3  . oxyCODONE-acetaminophen (PERCOCET/ROXICET) 5-325 MG per tablet    Sig: Take 1 tablet by mouth every 8 (eight) hours as needed.    Dispense:  100 tablet    Refill:  0    May fill on or after January 04, 2015.

## 2014-12-18 ENCOUNTER — Telehealth: Payer: Self-pay

## 2014-12-18 DIAGNOSIS — C4330 Malignant melanoma of unspecified part of face: Secondary | ICD-10-CM

## 2014-12-18 NOTE — Telephone Encounter (Signed)
Patient was diagnosis ed with melanoma and wants Dr. Leward Quan to call daughter, [name] to determine future care.  Provide a name of oncologist   [phone]

## 2014-12-20 NOTE — Telephone Encounter (Signed)
I left a message with [name], pt's daughter, re: recent biopsy results and discussion with Dr. Coralyn Helling. I will call again tomorrow to discuss referral to local oncologist.

## 2014-12-21 NOTE — Telephone Encounter (Signed)
I spoke w/ [name] re: recent diagnosis of malignant melanoma. Will refer pt to [facility] for management and plan of care. Family is interested in primarily information about prognosis and comfort measures, given pt's advanced age.  [name] was not certain what to do about proceeding with establishing pt at [facility]; she will table this for now, until pt has been seen at [facility].

## 2014-12-26 ENCOUNTER — Telehealth: Payer: Self-pay | Admitting: Oncology

## 2014-12-26 NOTE — Telephone Encounter (Signed)
NEW PATIENT REFERRAL-LEFT MESSAGE FOR PATIENT DTR [name] TO RETURN CALL

## 2014-12-27 ENCOUNTER — Ambulatory Visit: Payer: PPO

## 2014-12-27 ENCOUNTER — Encounter: Payer: Self-pay | Admitting: Oncology

## 2014-12-27 ENCOUNTER — Ambulatory Visit (HOSPITAL_BASED_OUTPATIENT_CLINIC_OR_DEPARTMENT_OTHER): Payer: PPO | Admitting: Oncology

## 2014-12-27 VITALS — BP 124/46 | HR 94 | Temp 98.3°F | Resp 18 | Ht 64.5 in | Wt 149.2 lb

## 2014-12-27 DIAGNOSIS — D649 Anemia, unspecified: Secondary | ICD-10-CM | POA: Diagnosis not present

## 2014-12-27 DIAGNOSIS — Z808 Family history of malignant neoplasm of other organs or systems: Secondary | ICD-10-CM

## 2014-12-27 DIAGNOSIS — C439 Malignant melanoma of skin, unspecified: Secondary | ICD-10-CM

## 2014-12-27 DIAGNOSIS — C779 Secondary and unspecified malignant neoplasm of lymph node, unspecified: Secondary | ICD-10-CM | POA: Diagnosis not present

## 2014-12-27 DIAGNOSIS — M069 Rheumatoid arthritis, unspecified: Secondary | ICD-10-CM | POA: Diagnosis not present

## 2014-12-27 DIAGNOSIS — Z853 Personal history of malignant neoplasm of breast: Secondary | ICD-10-CM

## 2014-12-27 NOTE — Progress Notes (Signed)
Checked in new pt with no financial concerns prior to seeing the dr.  Informed pt if chemo is part of her treatment we will contact her ins to see if Josem Kaufmann is required and will obtain it if it is as well as contact foundations that offer copay assistance for chemo if needed.  She has my card for any billing questions or concerns.

## 2014-12-27 NOTE — Progress Notes (Signed)
Patty Walters Consult   Referring MD: Barton Fanny, Md [place], [place] [address]   Patty Walters 81 y.o.  08/10/1921    Reason for Referral: Melanoma   HPI: Patty Walters presents today with her daughter. The history is largely from her daughter. Abnormal moles were noted at the right for head earlier this year. She was referred to Dr. Danielle Dess and underwent a biopsy. The pathology from one of lesions returned as a "spindle cell "neoplasm. We do not have the dermatology surgical reports available today. She was taken to a wide excision procedure. The daughter reports Dr. Levada Dy noted a fullness in the right preauricular region when she was seen for a postoperative visit. A shave biopsy from the right preauricular area on 12/12/2014 revealed a lymph node effaced by atypical spindle cells. Immunohistochemical stains were consistent with malignant melanoma metastatic to a lymph node.  She reports feeling well prior to the biopsy procedure. The surgical defect at the right for head is healing. She has a previous history of basal cell carcinomas and underwent Mohs surgery in the past.  Past Medical History  Diagnosis Date  . Rheumatoid arthritis(714.0)   . Allergy   .  left breast cancer-adjuvant hormonal therapy   2007   . Blood transfusion without reported diagnosis   . Anemia-chronic    . Cataract     .  History of a basal cell carcinoma   .  Nonhealing ulcer at the right lower leg following an insect bite requiring plastic surgery  Past Surgical History  Procedure Laterality Date  . Joint replacement    . Mastectomy Left  2007   . Hip replacement Bilateral   . Appendectomy    .  skin graft at a right lower leg wound                Medications: Reviewed  Allergies: No Known Allergies  Family history: A brother died of metastatic melanoma, a sister died of head and neck cancer-smoker. No other family history of cancer  Social  History:   She lives with her daughter in [place]. 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 She does not use cigarettes. She has one glass of red wine each night. She has been transfused with red blood cells in the past.  History  Alcohol Use  . 0.6 oz/week  . 1 Glasses of wine per week    Comment: 1 glassred wine  at dinner each night    History  Smoking status  . Never Smoker   Smokeless tobacco  . Never Used    ROS:   Positives include: Fullness at the right preauricular area, history of wheezing evaluated by pulmonary medicine in the past, limited mobility  A complete ROS was otherwise negative.  Physical Exam:  Blood pressure 124/46, pulse 94, temperature 98.3 F (36.8 C), temperature source Oral, resp. rate 18, height 5' 4.5" (1.638 m), weight 149 lb 3.2 oz (67.677 kg), SpO2 98 %.  HEENT: 3 cm mass at the right preauricular area, raised purple fleshy lesion at the posterior left tongue, neck without mass Lungs: Clear bilaterally Cardiac: RRR Abdomen: No hepatosplenomegaly, nontender, no mass Breast: Status post left mastectomy. No evidence for chest wall tumor recurrence   Vascular: 1+ edema at the right greater than left lower leg lymph nodes: No cervical, supraclavicular, axillary, or inguinal nodes Neurologic: Alert and oriented, the motor exam appears intact in the upper  and lower extremities Skin: Healed surgical defect at the right pretibial area, healing wound at the right forehead Musculoskeletal: No spine tenderness    LAB:  CBC  Lab Results  Component Value Date   WBC 6.2 12/11/2014   HGB 10.7* 12/11/2014   HCT 32.3* 12/11/2014   MCV 84.3 12/11/2014   PLT 308 12/11/2014   NEUTROABS 4.5 12/11/2014     CMP      Component Value Date/Time   NA 139 12/11/2014 1653   K 4.5 12/11/2014 1653   CL 105 12/11/2014 1653   CO2 23 12/11/2014 1653   GLUCOSE 91 12/11/2014 1653   BUN 24* 12/11/2014 1653   CREATININE 0.95 12/11/2014 1653    CREATININE 1.06 01/10/2012 1500   CALCIUM 9.0 12/11/2014 1653   PROT 7.0 12/11/2014 1653   ALBUMIN 3.8 12/11/2014 1653   AST 21 12/11/2014 1653   ALT 15 12/11/2014 1653   ALKPHOS 65 12/11/2014 1653   BILITOT 0.3 12/11/2014 1653   GFRNONAA 53* 12/11/2014 1653   GFRNONAA 45* 01/10/2012 1500   GFRAA 61 12/11/2014 1653   GFRAA 53* 01/10/2012 1500       Assessment/Plan:   1. Malignant melanoma-right for head lesion (pathology report not available today), biopsy of a right preauricular mass confirmed metastatic melanoma involving a lymph node  2. Rheumatoid arthritis   3.   History of left-sided breast cancer, status post left mastectomy followed by hormonal therapy  4.   Chronic anemia  5.   Family history of melanoma  6.   Raised purple lesion at the posterior left tongue   Disposition:   Ms.  Walters has been diagnosed with malignant melanoma. She appears to have had a right for head primary with metastatic disease involving a right preauricular lymph node. There is no clinical evidence of additional metastatic disease.  I discussed the diagnosis and treatment options with Patty Walters and her daughter. We discussed standard treatment with a right neck lymph node dissection if there is no distant metastatic disease. We decided to proceed with staging CTs of the brain, neck, chest, abdomen, and pelvis. We will then decide on the indication for an ENT referral.  I will follow-up on the pathology report from the forehead biopsy. We will request  BRAF mutation testing on the melanoma.   Patty Walters will return for an office visit 01/07/2015.  She requested I contact her other daughter by telephone.  [place], [place] 12/27/2014, 4:36 PM

## 2014-12-28 ENCOUNTER — Encounter: Payer: Self-pay | Admitting: *Deleted

## 2014-12-28 ENCOUNTER — Telehealth: Payer: Self-pay | Admitting: Oncology

## 2014-12-28 NOTE — Telephone Encounter (Signed)
S/w pt's daughter confirming MD visit also they did see on my chart, they will be contacting radiology to schedule CT's for next week.... KJ

## 2014-12-28 NOTE — Progress Notes (Signed)
[place] Work  Clinical Social Work was referred by nurse for assessment of psychosocial needs due to request to answer questions about ADRs/HCPOA.  Clinical Social Worker met with patient and her daughter, [name] at [facility] to offer support and assess for needs.  CSW reviewed ADR packet and answered questions about HCPOA as well. Pt and daughter would like to complete at next appointment after reviewing and discussing material further amongst themselves. CSW scheduled appointment for them to see Johnnye Lana at 3pm on 01/07/15 to complete ADRs. Pt has great family support and resides with her daughter in [place]. Pt's other daughter is a palliative care NP in [place]. Both are very involved in their mother's care and want to discuss ADRs further with pt prior to signing. Pt shared she would most likely to receive a DNR as well from Dr Benay Spice on that date.   Clinical Social Work interventions: ADR education Assessment and support  Loren Racer, [place] Worker [place]  [place] Phone: [phone] Fax: [phone]

## 2015-01-01 ENCOUNTER — Telehealth: Payer: Self-pay | Admitting: *Deleted

## 2015-01-01 NOTE — Telephone Encounter (Signed)
Spoke with Lattie Haw in [place] pathology. Requested BRAF to be added to initial biopsy. They will fax [place] a requisition form.

## 2015-01-01 NOTE — Telephone Encounter (Signed)
Faxed signed BRAF request to [facility].

## 2015-01-02 NOTE — Telephone Encounter (Signed)
Received copy of path report and op note from Dr. Kendrick Fries office. Placed on Dr. Gearldine Shown desk for review.

## 2015-01-03 ENCOUNTER — Encounter (HOSPITAL_COMMUNITY): Payer: Self-pay

## 2015-01-03 ENCOUNTER — Other Ambulatory Visit: Payer: Self-pay | Admitting: Oncology

## 2015-01-03 ENCOUNTER — Ambulatory Visit (HOSPITAL_COMMUNITY)
Admission: RE | Admit: 2015-01-03 | Discharge: 2015-01-03 | Disposition: A | Discharge status: Home / Self Care | Payer: PPO | Source: Ambulatory Visit | Attending: Oncology | Admitting: Oncology

## 2015-01-03 DIAGNOSIS — Z85820 Personal history of malignant melanoma of skin: Secondary | ICD-10-CM | POA: Diagnosis not present

## 2015-01-03 DIAGNOSIS — Z853 Personal history of malignant neoplasm of breast: Secondary | ICD-10-CM | POA: Diagnosis not present

## 2015-01-03 DIAGNOSIS — C439 Malignant melanoma of skin, unspecified: Secondary | ICD-10-CM

## 2015-01-03 DIAGNOSIS — M069 Rheumatoid arthritis, unspecified: Secondary | ICD-10-CM | POA: Insufficient documentation

## 2015-01-03 DIAGNOSIS — D383 Neoplasm of uncertain behavior of mediastinum: Secondary | ICD-10-CM | POA: Insufficient documentation

## 2015-01-03 MED ORDER — IOHEXOL 300 MG/ML  SOLN
100.0000 mL | Freq: Once | INTRAMUSCULAR | Status: AC | PRN
  Administered 2015-01-03: 100 mL via INTRAVENOUS

## 2015-01-04 ENCOUNTER — Other Ambulatory Visit: Payer: Self-pay | Admitting: Family Medicine

## 2015-01-07 ENCOUNTER — Ambulatory Visit (HOSPITAL_BASED_OUTPATIENT_CLINIC_OR_DEPARTMENT_OTHER): Payer: PPO | Admitting: Oncology

## 2015-01-07 ENCOUNTER — Encounter: Payer: Self-pay | Admitting: *Deleted

## 2015-01-07 ENCOUNTER — Telehealth: Payer: Self-pay | Admitting: Oncology

## 2015-01-07 VITALS — BP 125/58 | HR 80 | Temp 98.5°F | Resp 17 | Ht 64.5 in | Wt 146.0 lb

## 2015-01-07 DIAGNOSIS — K149 Disease of tongue, unspecified: Secondary | ICD-10-CM | POA: Diagnosis not present

## 2015-01-07 DIAGNOSIS — Z808 Family history of malignant neoplasm of other organs or systems: Secondary | ICD-10-CM

## 2015-01-07 DIAGNOSIS — M069 Rheumatoid arthritis, unspecified: Secondary | ICD-10-CM

## 2015-01-07 DIAGNOSIS — C439 Malignant melanoma of skin, unspecified: Secondary | ICD-10-CM

## 2015-01-07 DIAGNOSIS — D649 Anemia, unspecified: Secondary | ICD-10-CM | POA: Diagnosis not present

## 2015-01-07 DIAGNOSIS — Z853 Personal history of malignant neoplasm of breast: Secondary | ICD-10-CM

## 2015-01-07 DIAGNOSIS — C779 Secondary and unspecified malignant neoplasm of lymph node, unspecified: Secondary | ICD-10-CM | POA: Diagnosis not present

## 2015-01-07 NOTE — Progress Notes (Signed)
[place] Social Work  Clinical Social Work was referred by patient and family to review and complete healthcare advance directives.  Clinical Social Worker met with patient and patient's daughter in [place] office.  The patient designated Alesia Banda Corvinus as their primary healthcare agent and Makalah Asberry Bersin as their secondary agent.  Patient also completed healthcare living will.    Clinical Social Worker notarized documents and made copies for patient/family. Clinical Social Worker will send documents to medical records to be scanned into patient's chart. Clinical Social Worker encouraged patient/family to contact with any additional questions or concerns.  Johnnye Lana, MSW, LCSW, OSW-C Clinical Social Worker [facility] [phone]

## 2015-01-07 NOTE — Progress Notes (Signed)
  [place] OFFICE PROGRESS NOTE   Diagnosis: Melanoma  INTERVAL HISTORY:   She returns as scheduled. The right forehead wound is healing. No new complaint.  CTs of the neck, chest, abdomen, and pelvis on 01/03/2015 confirmed a 1.5 cm solid soft tissue mass in the right preauricular region involving the superior aspect of the right parotid gland. A smaller 4 mm cutaneous nodule was noted anterior to this mass. No evidence of distant metastatic disease. 3 mm nonspecific low-attenuation focus in the left liver. Objective:  Vital signs in last 24 hours:  Blood pressure 125/58, pulse 80, temperature 98.5 F (36.9 C), temperature source Oral, resp. rate 17, height 5' 4.5" (1.638 m), weight 146 lb (66.225 kg), SpO2 98 %.    HEENT: Approximate 1.5 cm right preauricular mass.  Skin: Wound at the right forehead appears to be healing.    Imaging: As per history of present illness, images reviewed   Medications: I have reviewed the patient's current medications.  Assessment/Plan: 1. Malignant melanoma-right for head lesion shave biopsy confirmed a malignant spindle cell neoplasm 10/04/2014, biopsy of a right preauricular mass 12/12/2014 confirmed metastatic melanoma involving a lymph node  Staging CT scans 01/03/2015 revealed a 1.5 cm right preauricular mass with an adjacent 4 mm subcutaneous nodule, no evidence of distant metastatic disease  2. Rheumatoid arthritis  3. History of left-sided breast cancer, status post left mastectomy followed by hormonal therapy  4. Chronic anemia  5. Family history of melanoma  6. Raised purple lesion at the posterior left tongue   Disposition:  Ms. Matlin appears stable. The staging CT scans reveal no evidence of distant metastatic disease. She has a right preauricular mass involving the parotid gland with an adjacent 4 mm nodule that are likely related to metastatic melanoma.I discussed treatment options with her  today. I recommend an ENT surgery referral to consider excision of the preauricular mass/nodule. Her daughter indicates they do not desire surgery if this is likely to be uninvolved procedure.  She will return for an office visit here in 6 weeks. I plan to follow her with observation unless Dr. Constance Holster recommends excision of the preauricular mass. We are waiting on BRAF testing.  I will contact her other daughter by telephone to discuss the case.  Betsy Coder, MD  01/07/2015  4:20 PM

## 2015-01-07 NOTE — Telephone Encounter (Signed)
per pof to sch pt appt-cld Dr Redmond Baseman office and sch appt w/ENT-gave pt copy of avs

## 2015-01-31 ENCOUNTER — Ambulatory Visit: Payer: PPO | Admitting: Nurse Practitioner

## 2015-02-13 ENCOUNTER — Emergency Department (HOSPITAL_COMMUNITY): Payer: PPO

## 2015-02-13 ENCOUNTER — Emergency Department (HOSPITAL_COMMUNITY)
Admission: EM | Admit: 2015-02-13 | Discharge: 2015-02-13 | Disposition: A | Discharge status: Home / Self Care | Payer: PPO | Attending: Emergency Medicine | Admitting: Emergency Medicine

## 2015-02-13 ENCOUNTER — Encounter (HOSPITAL_COMMUNITY): Payer: Self-pay | Admitting: *Deleted

## 2015-02-13 DIAGNOSIS — Z79899 Other long term (current) drug therapy: Secondary | ICD-10-CM | POA: Diagnosis not present

## 2015-02-13 DIAGNOSIS — Z859 Personal history of malignant neoplasm, unspecified: Secondary | ICD-10-CM | POA: Insufficient documentation

## 2015-02-13 DIAGNOSIS — N390 Urinary tract infection, site not specified: Secondary | ICD-10-CM | POA: Insufficient documentation

## 2015-02-13 DIAGNOSIS — R102 Pelvic and perineal pain: Secondary | ICD-10-CM | POA: Diagnosis present

## 2015-02-13 DIAGNOSIS — Z862 Personal history of diseases of the blood and blood-forming organs and certain disorders involving the immune mechanism: Secondary | ICD-10-CM | POA: Diagnosis not present

## 2015-02-13 LAB — CBC WITH DIFFERENTIAL/PLATELET
BASOS ABS: 0.1 10*3/uL (ref 0.0–0.1)
BASOS PCT: 1 % (ref 0–1)
Eosinophils Absolute: 0.2 10*3/uL (ref 0.0–0.7)
Eosinophils Relative: 2 % (ref 0–5)
HCT: 31.8 % — ABNORMAL LOW (ref 36.0–46.0)
Hemoglobin: 10.4 g/dL — ABNORMAL LOW (ref 12.0–15.0)
LYMPHS ABS: 1.2 10*3/uL (ref 0.7–4.0)
LYMPHS PCT: 14 % (ref 12–46)
MCH: 28.5 pg (ref 26.0–34.0)
MCHC: 32.7 g/dL (ref 30.0–36.0)
MCV: 87.1 fL (ref 78.0–100.0)
MONO ABS: 0.6 10*3/uL (ref 0.1–1.0)
Monocytes Relative: 7 % (ref 3–12)
NEUTROS PCT: 76 % (ref 43–77)
Neutro Abs: 6.9 10*3/uL (ref 1.7–7.7)
PLATELETS: 258 10*3/uL (ref 150–400)
RBC: 3.65 MIL/uL — AB (ref 3.87–5.11)
RDW: 13.8 % (ref 11.5–15.5)
WBC: 9 10*3/uL (ref 4.0–10.5)

## 2015-02-13 LAB — URINE MICROSCOPIC-ADD ON

## 2015-02-13 LAB — COMPREHENSIVE METABOLIC PANEL
ALT: 15 U/L (ref 14–54)
ANION GAP: 10 (ref 5–15)
AST: 20 U/L (ref 15–41)
Albumin: 3.8 g/dL (ref 3.5–5.0)
Alkaline Phosphatase: 79 U/L (ref 38–126)
BUN: 31 mg/dL — ABNORMAL HIGH (ref 6–20)
CALCIUM: 9 mg/dL (ref 8.9–10.3)
CO2: 24 mmol/L (ref 22–32)
CREATININE: 1.15 mg/dL — AB (ref 0.44–1.00)
Chloride: 107 mmol/L (ref 101–111)
GFR calc Af Amer: 47 mL/min — ABNORMAL LOW (ref >60)
GFR, EST NON AFRICAN AMERICAN: 40 mL/min — AB (ref >60)
Glucose, Bld: 108 mg/dL — ABNORMAL HIGH (ref 65–99)
Potassium: 4.6 mmol/L (ref 3.5–5.1)
Sodium: 141 mmol/L (ref 135–145)
TOTAL PROTEIN: 7.4 g/dL (ref 6.5–8.1)
Total Bilirubin: 0.6 mg/dL (ref 0.3–1.2)

## 2015-02-13 LAB — URINALYSIS, ROUTINE W REFLEX MICROSCOPIC
Bilirubin Urine: NEGATIVE
Glucose, UA: NEGATIVE mg/dL
Hgb urine dipstick: NEGATIVE
Ketones, ur: NEGATIVE mg/dL
Nitrite: NEGATIVE
PH: 6.5 (ref 5.0–8.0)
Protein, ur: NEGATIVE mg/dL
Specific Gravity, Urine: 1.01 (ref 1.005–1.030)
UROBILINOGEN UA: 0.2 mg/dL (ref 0.0–1.0)

## 2015-02-13 MED ORDER — CEPHALEXIN 500 MG PO CAPS: 500.0000 mg | ORAL_CAPSULE | Freq: Two times a day (BID) | ORAL | Status: DC

## 2015-02-13 MED ORDER — MORPHINE SULFATE 2 MG/ML IJ SOLN
2.0000 mg | Freq: Once | INTRAMUSCULAR | Status: AC
  Administered 2015-02-13: 2 mg via INTRAVENOUS
  Filled 2015-02-13: qty 1

## 2015-02-13 MED ORDER — DEXTROSE 5 % IV SOLN
1.0000 g | Freq: Once | INTRAVENOUS | Status: AC
  Administered 2015-02-13: 1 g via INTRAVENOUS
  Filled 2015-02-13: qty 10

## 2015-02-13 MED ORDER — IOHEXOL 300 MG/ML  SOLN
25.0000 mL | Freq: Once | INTRAMUSCULAR | Status: AC | PRN
  Administered 2015-02-13: 25 mL via ORAL

## 2015-02-13 MED ORDER — SODIUM CHLORIDE 0.9 % IV SOLN
INTRAVENOUS | Status: DC
  Administered 2015-02-13: 07:00:00 via INTRAVENOUS

## 2015-02-13 MED ORDER — IOHEXOL 300 MG/ML  SOLN
80.0000 mL | Freq: Once | INTRAMUSCULAR | Status: AC | PRN
  Administered 2015-02-13: 80 mL via INTRAVENOUS

## 2015-02-13 MED ORDER — OXYCODONE-ACETAMINOPHEN 5-325 MG PO TABS: 0.5000 | ORAL_TABLET | Freq: Four times a day (QID) | ORAL | Status: DC | PRN

## 2015-02-13 NOTE — Discharge Instructions (Signed)
Urinary Tract Infection Urinary tract infections (UTIs) can develop anywhere along your urinary tract. Your urinary tract is your body's drainage system for removing wastes and extra water. Your urinary tract includes two kidneys, two ureters, a bladder, and a urethra. Your kidneys are a pair of bean-shaped organs. Each kidney is about the size of your fist. They are located below your ribs, one on each side of your spine. CAUSES Infections are caused by microbes, which are microscopic organisms, including fungi, viruses, and bacteria. These organisms are so small that they can only be seen through a microscope. Bacteria are the microbes that most commonly cause UTIs. SYMPTOMS  Symptoms of UTIs may vary by age and gender of the patient and by the location of the infection. Symptoms in young women typically include a frequent and intense urge to urinate and a painful, burning feeling in the bladder or urethra during urination. Older women and men are more likely to be tired, shaky, and weak and have muscle aches and abdominal pain. A fever may mean the infection is in your kidneys. Other symptoms of a kidney infection include pain in your back or sides below the ribs, nausea, and vomiting. DIAGNOSIS To diagnose a UTI, your caregiver will ask you about your symptoms. Your caregiver also will ask to provide a urine sample. The urine sample will be tested for bacteria and white blood cells. White blood cells are made by your body to help fight infection. TREATMENT  Typically, UTIs can be treated with medication. Because most UTIs are caused by a bacterial infection, they usually can be treated with the use of antibiotics. The choice of antibiotic and length of treatment depend on your symptoms and the type of bacteria causing your infection. HOME CARE INSTRUCTIONS  If you were prescribed antibiotics, take them exactly as your caregiver instructs you. Finish the medication even if you feel better after you  have only taken some of the medication.  Drink enough water and fluids to keep your urine clear or pale yellow.  Avoid caffeine, tea, and carbonated beverages. They tend to irritate your bladder.  Empty your bladder often. Avoid holding urine for long periods of time.  Empty your bladder before and after sexual intercourse.  After a bowel movement, women should cleanse from front to back. Use each tissue only once. SEEK MEDICAL CARE IF:   You have back pain.  You develop a fever.  Your symptoms do not begin to resolve within 3 days. SEEK IMMEDIATE MEDICAL CARE IF:   You have severe back pain or lower abdominal pain.  You develop chills.  You have nausea or vomiting.  You have continued burning or discomfort with urination. MAKE SURE YOU:   Understand these instructions.  Will watch your condition.  Will get help right away if you are not doing well or get worse. Document Released: 04/29/2005 Document Revised: 01/19/2012 Document Reviewed: 08/28/2011 [facility] Patient Information 2015 [place], [place]. This information is not intended to replace advice given to you by your health care provider. Make sure you discuss any questions you have with your health care provider.   Hip Pain Your hip is the joint between your upper legs and your lower pelvis. The bones, cartilage, tendons, and muscles of your hip joint perform a lot of work each day supporting your body weight and allowing you to move around. Hip pain can range from a minor ache to severe pain in one or both of your hips. Pain may be felt on  the inside of the hip joint near the groin, or the outside near the buttocks and upper thigh. You may have swelling or stiffness as well.  HOME CARE INSTRUCTIONS   Take medicines only as directed by your health care provider.  Apply ice to the injured area:  Put ice in a plastic bag.  Place a towel between your skin and the bag.  Leave the ice on for 15-20 minutes at a time,  3-4 times a day.  Keep your leg raised (elevated) when possible to lessen swelling.  Avoid activities that cause pain.  Follow specific exercises as directed by your health care provider.  Sleep with a pillow between your legs on your most comfortable side.  Record how often you have hip pain, the location of the pain, and what it feels like. SEEK MEDICAL CARE IF:   You are unable to put weight on your leg.  Your hip is red or swollen or very tender to touch.  Your pain or swelling continues or worsens after 1 week.  You have increasing difficulty walking.  You have a fever. SEEK IMMEDIATE MEDICAL CARE IF:   You have fallen.  You have a sudden increase in pain and swelling in your hip. MAKE SURE YOU:   Understand these instructions.  Will watch your condition.  Will get help right away if you are not doing well or get worse. Document Released: 01/07/2010 Document Revised: 12/04/2013 Document Reviewed: 03/16/2013 [facility] Patient Information 2015 [place], [place]. This information is not intended to replace advice given to you by your health care provider. Make sure you discuss any questions you have with your health care provider.

## 2015-02-13 NOTE — ED Provider Notes (Signed)
CSN: [id number]     Arrival date & time 02/13/15  0251 History   First MD Initiated Contact with Patient 02/13/15 0308     Chief Complaint  Patient presents with  . Pelvic Pain    Right     (Consider location/radiation/quality/duration/timing/severity/associated sxs/prior Treatment) Patient is a 81 y.o. female presenting with pelvic pain. The history is provided by the patient and a relative. No language interpreter was used.  Pelvic Pain Associated symptoms include abdominal pain. Pertinent negatives include no chills, fever, headaches, nausea or vomiting. Associated symptoms comments: Presents with daughter who is also caregiver with complaint of abdominal pain. The patient reports her pain is across the lower abdomen, that it comes and goes and is not present currently. Per the daughter, she complains of pain in the right hip, worsening over 2 days, and obvious when ambulating. She fell 5 days ago while in the bathroom. She was evaluated by EMS and found to be without injury. She has been ambulating per her usual until 2 days ago having been asymptomatic the 3 interim days since the actual fall. No vomiting, change in appetite, confusion, fever, dysuria..    Past Medical History  Diagnosis Date  . Rheumatoid arthritis(714.0)   . Allergy   . Cancer   . Blood transfusion without reported diagnosis   . Anemia   . Cataract    Past Surgical History  Procedure Laterality Date  . Joint replacement    . Mastectomy Left   . Hip replacement Bilateral   . Appendectomy    . Fracture surgery    . Breast surgery Left 2010    ? breast cancer   Family History  Problem Relation Age of Onset  . Emphysema Father     smoked and was exposed to mustard gas  . Cancer Sister     esophageal  . Cancer Brother     melanoma  . Hypertension Daughter   . Hypertension Son    History  Substance Use Topics  . Smoking status: Never Smoker   . Smokeless tobacco: Never Used  . Alcohol Use: 0.6  oz/week    1 Glasses of wine per week     Comment: 1 glassred wine  at dinner each night   OB History    No data available     Review of Systems  Constitutional: Negative for fever and chills.  HENT: Negative.   Respiratory: Negative.   Cardiovascular: Negative.   Gastrointestinal: Positive for abdominal pain. Negative for nausea, vomiting, diarrhea and constipation.  Genitourinary: Positive for pelvic pain. Negative for dysuria.  Musculoskeletal: Negative.  Negative for back pain.       Right hip pain.  Skin: Negative.  Negative for wound.  Neurological: Negative.  Negative for speech difficulty and headaches.      Allergies  Review of patient's allergies indicates no known allergies.  Home Medications   Prior to Admission medications   Medication Sig Start Date End Date Taking? Authorizing Provider  Calcium Carbonate-Vitamin D (CALTRATE 600+D) 600-400 MG-UNIT per tablet Take 0.5 tablets by mouth daily.    Yes Historical Provider, MD  diphenhydrAMINE ([employer] SLEEP AID) 25 MG tablet Take 25 mg by mouth at bedtime as needed for sleep.   Yes Historical Provider, MD  docusate sodium (COLACE) 100 MG capsule Take 100 mg by mouth daily.   Yes Historical Provider, MD  DULoxetine (CYMBALTA) 60 MG capsule Take 1 capsule (60 mg total) by mouth daily. 12/04/14  Yes Lavone Neri  Leward Quan, MD  furosemide (LASIX) 20 MG tablet TAKE 1/2 TO 1 TABLET BY MOUTH EVERY DAY Patient taking differently: Take 20 mg by mouth daily.  12/11/14  Yes Barton Fanny, MD  hydroxychloroquine (PLAQUENIL) 200 MG tablet TAKE 1 TABLET (200 MG TOTAL) BY MOUTH DAILY. 12/11/14  Yes Barton Fanny, MD  ibuprofen (ADVIL,MOTRIN) 200 MG tablet Take 200 mg by mouth every 6 (six) hours as needed (for pain.).   Yes Historical Provider, MD  Multiple Vitamin (MULTIVITAMIN) tablet Take 1 tablet by mouth daily.   Yes Historical Provider, MD  oxyCODONE-acetaminophen (PERCOCET/ROXICET) 5-325 MG per tablet Take 1 tablet by mouth  every 8 (eight) hours as needed. Patient taking differently: Take 1 tablet by mouth every 8 (eight) hours as needed (for pain.).  12/11/14  Yes Barton Fanny, MD   BP 136/63 mmHg  Pulse 85  Temp(Src) 98.1 F (36.7 C) (Oral)  Resp 16  SpO2 99% Physical Exam  Constitutional: She is oriented to person, place, and time. She appears well-developed and well-nourished.  HENT:  Head: Normocephalic.  Neck: Normal range of motion. Neck supple.  Cardiovascular: Normal rate and regular rhythm.   Pulmonary/Chest: Effort normal and breath sounds normal. She has no wheezes. She has no rales.  Abdominal: Soft. Bowel sounds are normal. She exhibits no distension. There is no tenderness. There is no rebound and no guarding.  Abdomen nontender to light or deep palpation.  Musculoskeletal: Normal range of motion. She exhibits no edema.  FROM of LE's. Hips non-tender.   Neurological: She is alert and oriented to person, place, and time.  Skin: Skin is warm and dry. No rash noted.  Psychiatric: She has a normal mood and affect.    ED Course  Procedures (including critical care time) Labs Review Labs Reviewed  COMPREHENSIVE METABOLIC PANEL - Abnormal; Notable for the following:    Glucose, Bld 108 (*)    BUN 31 (*)    Creatinine, Ser 1.15 (*)    GFR calc non Af Amer 40 (*)    GFR calc Af Amer 47 (*)    All other components within normal limits  CBC WITH DIFFERENTIAL/PLATELET - Abnormal; Notable for the following:    RBC 3.65 (*)    Hemoglobin 10.4 (*)    HCT 31.8 (*)    All other components within normal limits  URINALYSIS, ROUTINE W REFLEX MICROSCOPIC (NOT AT [facility]) - Abnormal; Notable for the following:    APPearance CLOUDY (*)    Leukocytes, UA LARGE (*)    All other components within normal limits  URINE MICROSCOPIC-ADD ON - Abnormal; Notable for the following:    Squamous Epithelial / LPF FEW (*)    Bacteria, UA MANY (*)    All other components within normal limits  URINE CULTURE     Imaging Review No results found.   EKG Interpretation None      MDM   Final diagnoses:  None    Patient care transferred to [facility], PA-C, pending CT scan to evaluate abdomen. Plain film of pelvis pending. Labs unremarkable.     Charlann Lange, PA-C 02/13/15 [place], DO 02/13/15 [id number]

## 2015-02-13 NOTE — ED Notes (Signed)
Pt fell Friday in bathroom, laid in bathroom x 2.5 hours, called EMS out and EMS checked her out to be fine, pt was sore but per family all weekend was fine, tonight pt woke up complaining of R pelvic pain, pt did fall on that side.

## 2015-02-16 LAB — URINE CULTURE

## 2015-02-17 ENCOUNTER — Telehealth (HOSPITAL_BASED_OUTPATIENT_CLINIC_OR_DEPARTMENT_OTHER): Payer: Self-pay | Admitting: Emergency Medicine

## 2015-02-17 NOTE — Telephone Encounter (Signed)
Post ED Visit - Positive Culture Follow-up  Culture report reviewed by antimicrobial stewardship pharmacist: []  Wes Dulaney, Pharm.D., BCPS []  Heide Guile, Pharm.D., BCPS []  Alycia Rossetti, Pharm.D., BCPS []  [place], Pharm.D., BCPS, AAHIVP []  Legrand Como, Pharm.D., BCPS, AAHIVP []  Isac Sarna, Pharm.D., BCPS Salome Arnt PharmD  Positive urine culture E. coli Treated with cephalexin, organism sensitive to the same and no further patient follow-up is required at this time.  Hazle Nordmann 02/17/2015, 11:42 AM

## 2015-02-18 ENCOUNTER — Ambulatory Visit (HOSPITAL_BASED_OUTPATIENT_CLINIC_OR_DEPARTMENT_OTHER): Payer: PPO | Admitting: Oncology

## 2015-02-18 VITALS — BP 130/52 | HR 87 | Temp 98.5°F | Resp 18 | Ht 64.5 in | Wt 146.8 lb

## 2015-02-18 DIAGNOSIS — D649 Anemia, unspecified: Secondary | ICD-10-CM

## 2015-02-18 DIAGNOSIS — C779 Secondary and unspecified malignant neoplasm of lymph node, unspecified: Secondary | ICD-10-CM

## 2015-02-18 DIAGNOSIS — C439 Malignant melanoma of skin, unspecified: Secondary | ICD-10-CM | POA: Diagnosis not present

## 2015-02-18 DIAGNOSIS — K149 Disease of tongue, unspecified: Secondary | ICD-10-CM

## 2015-02-18 DIAGNOSIS — R591 Generalized enlarged lymph nodes: Secondary | ICD-10-CM | POA: Diagnosis not present

## 2015-02-18 DIAGNOSIS — Z853 Personal history of malignant neoplasm of breast: Secondary | ICD-10-CM

## 2015-02-18 DIAGNOSIS — Z808 Family history of malignant neoplasm of other organs or systems: Secondary | ICD-10-CM

## 2015-02-18 DIAGNOSIS — M069 Rheumatoid arthritis, unspecified: Secondary | ICD-10-CM

## 2015-02-18 NOTE — Progress Notes (Signed)
  [place] OFFICE PROGRESS NOTE   Diagnosis: Melanoma  INTERVAL HISTORY:   Patty Walters returns as scheduled. She saw Dr. Redmond Baseman and decided against resection of the right preauricular mass. She feels well at present. Last week she developed low abdominal pain and right hip pain and was evaluated in the emergency room. She had a fall several days prior to the emergency room visit. She was treated for urinary tract infection. X-rays showed no acute finding. She feels the lesion at the right preauricular area has enlarged. She has a similar lesion on the left side. Objective:  Vital signs in last 24 hours:  Blood pressure 130/52, pulse 87, temperature 98.5 F (36.9 C), resp. rate 18, height 5' 4.5" (1.638 m), weight 146 lb 12.8 oz (66.588 kg), SpO2 99 %.    HEENT: Neck without mass. There is a 2 cm cutaneous mass at the right preauricular area with a less than 1/2 cm lesion anterior to the dominant mass. There is a mobile 1 cm rounded lesion in the left preauricular region. Lymphatics: No cervical, supraclavicular, axillary, or inguinal nodes. Resp: Lungs with inspiratory rales at the lower posterior chest bilaterally, no respiratory distress Cardio: Regular rate and rhythm GI: No hepatomegaly, nontender Vascular: Trace-1+ edema at the right greater than left lower leg Skin: Right forehead defect almost completely healed. 3-4 mm cutaneous nodules surrounding the medial aspect of the surgical site    Medications: I have reviewed the patient's current medications.  Assessment/Plan: 1. Malignant melanoma-right for head lesion shave biopsy confirmed a malignant spindle cell neoplasm 10/04/2014, biopsy of a right preauricular mass 12/12/2014 confirmed metastatic melanoma involving a lymph node  Staging CT scans 01/03/2015 revealed a 1.5 cm right preauricular mass with an adjacent 4 mm subcutaneous nodule, no evidence of distant metastatic disease  2. Rheumatoid  arthritis  3. History of left-sided breast cancer, status post left mastectomy followed by hormonal therapy  4. Chronic anemia  5. Family history of melanoma  6. Raised purple lesion at the posterior left tongue  7.   Escherichia coli UTI 02/13/2015   Disposition:  Patty Walters has a history of a locally advanced melanoma. After discussion with Dr. Redmond Baseman the patient and her family are most comfortable with observation as opposed to resection of the palpable right facial tumor.  The right preauricular mass appears stable, but she has developed a lymph node or cutaneous lesion in the left preauricular area and nodules surrounding the right forehead wound. These changes most likely represent progression of melanoma.  She does not appear to have symptoms related to metastatic melanoma at present. Patty Walters and her daughter are comfortable with continued observation. We will consider immunotherapy if she develops symptoms or more significant evidence of disease progression. She will return for an office visit in 6 weeks. We will see her sooner as needed.  Betsy Coder, MD  02/18/2015  5:26 PM

## 2015-02-21 ENCOUNTER — Ambulatory Visit (INDEPENDENT_AMBULATORY_CARE_PROVIDER_SITE_OTHER): Payer: PPO | Admitting: Nurse Practitioner

## 2015-02-21 ENCOUNTER — Encounter: Payer: Self-pay | Admitting: Nurse Practitioner

## 2015-02-21 VITALS — BP 120/68 | HR 92 | Temp 97.7°F | Resp 20 | Ht 65.0 in | Wt 147.2 lb

## 2015-02-21 DIAGNOSIS — C439 Malignant melanoma of skin, unspecified: Secondary | ICD-10-CM | POA: Diagnosis not present

## 2015-02-21 DIAGNOSIS — Z299 Encounter for prophylactic measures, unspecified: Secondary | ICD-10-CM

## 2015-02-21 DIAGNOSIS — M069 Rheumatoid arthritis, unspecified: Secondary | ICD-10-CM

## 2015-02-21 DIAGNOSIS — Z418 Encounter for other procedures for purposes other than remedying health state: Secondary | ICD-10-CM

## 2015-02-21 DIAGNOSIS — K5901 Slow transit constipation: Secondary | ICD-10-CM

## 2015-02-21 DIAGNOSIS — K5900 Constipation, unspecified: Secondary | ICD-10-CM | POA: Insufficient documentation

## 2015-02-21 DIAGNOSIS — R609 Edema, unspecified: Secondary | ICD-10-CM | POA: Diagnosis not present

## 2015-02-21 MED ORDER — OXYCODONE-ACETAMINOPHEN 5-325 MG PO TABS: 0.5000 | ORAL_TABLET | Freq: Four times a day (QID) | ORAL | Status: DC | PRN

## 2015-02-21 NOTE — Patient Instructions (Signed)
Stop benadryl (sleep aid) due to side effects  May use OTC melatonin 3-6 mg   Stop Advil due to adverse effects  Decreased Lasix to half tablet for 1 week then stop Use compression hose and elevate legs.   Follow up in 4 weeks for physical

## 2015-02-21 NOTE — Progress Notes (Signed)
Patient ID: Patty Walters, female   DOB: [birth year], 81 y.o.   MRN: [id number]    PCP: Sharon Seller, NP  No Known Allergies  Chief Complaint  Patient presents with  . Establish Care  . Medical Management of Chronic Issues     HPI: Patient is a 81 y.o. female seen in the office today to establish care and medical management of chronic conditions. Here with daughter who she lives with. Pt previous provider retired and now she has decided to establish care her for ongoing primary care. Last annual exam was in June 2015.  Pt with a pmh of anemia of chronic disease, RA, depression, edema, constipation, bilateral hip replacement (after failure from hip repair), melanoma followed by oncology.  Also followed by Dr Elvin So for dermatology, Dr Jenne Pane [facility]  Lesion on right forehead with half basal cell carcoma half melanoma. Took a large amount of skin to cut out the melanoma. Found a lump on the side of her face. However due to age did not wish to do further test or procedures.  Recently dx with UTI went to ED, completed antibiotics.    Advanced Directive information Does patient have an advance directive?: Yes Review of Systems:  Review of Systems  Constitutional: Negative for chills, diaphoresis, activity change, appetite change, fatigue and unexpected weight change.  HENT: Negative for congestion and hearing loss.   Eyes: Negative.        Hx of blepharitis, over due for eye doctor Dry eyes    Respiratory: Negative for cough and shortness of breath.   Cardiovascular: Negative for chest pain, palpitations and leg swelling.  Gastrointestinal: Positive for abdominal pain (right lower quad that comes in goes for many many years) and constipation. Negative for diarrhea.  Genitourinary: Negative for dysuria and difficulty urinating.  Musculoskeletal: Positive for arthralgias. Negative for myalgias.       RA- pain in legs, right shoulder and back Uses walker at home.    Skin: Negative for color change and wound.       Hx of melanoma, skin discoloration on her face.  Neurological: Negative for dizziness and weakness.  Psychiatric/Behavioral: Positive for sleep disturbance. Negative for behavioral problems, confusion and agitation.    Past Medical History  Diagnosis Date  . Rheumatoid arthritis(714.0)   . Allergy   . Cancer   . Blood transfusion without reported diagnosis   . Anemia   . Cataract   . UTI (urinary tract infection)   . Melanoma    Past Surgical History  Procedure Laterality Date  . Joint replacement    . Mastectomy Left   . Hip replacement Bilateral   . Appendectomy    . Fracture surgery    . Breast surgery Left 2010    ? breast cancer   Social History:   reports that she has never smoked. She has never used smokeless tobacco. She reports that she drinks about 0.6 oz of alcohol per week. She reports that she does not use illicit drugs.  Family History  Problem Relation Age of Onset  . Emphysema Father     smoked and was exposed to mustard gas  . Cancer Sister     esophageal  . Cancer Brother     melanoma  . Hypertension Daughter   . Hypertension Son     Medications: Patient's Medications  New Prescriptions   No medications on file  Previous Medications   CALCIUM CARBONATE-VITAMIN D (CALTRATE 600+D) 600-400 MG-UNIT  PER TABLET    Take 0.5 tablets by mouth daily.    DIPHENHYDRAMINE ([employer] SLEEP AID) 25 MG TABLET    Take 25 mg by mouth at bedtime as needed for sleep.   DOCUSATE SODIUM (COLACE) 100 MG CAPSULE    Take 100 mg by mouth daily.   DULOXETINE (CYMBALTA) 60 MG CAPSULE    Take 1 capsule (60 mg total) by mouth daily.   FUROSEMIDE (LASIX) 20 MG TABLET    TAKE 1/2 TO 1 TABLET BY MOUTH EVERY DAY   HYDROXYCHLOROQUINE (PLAQUENIL) 200 MG TABLET    TAKE 1 TABLET (200 MG TOTAL) BY MOUTH DAILY.   IBUPROFEN (ADVIL,MOTRIN) 200 MG TABLET    Take 200 mg by mouth every 6 (six) hours as needed (for pain.).   MAGNESIUM HYDROXIDE  (MILK OF MAGNESIA) 800 MG/5ML SUSPENSION    Take 15 mLs by mouth daily as needed for constipation.   MULTIPLE VITAMIN (MULTIVITAMIN) TABLET    Take 1 tablet by mouth daily.   OXYCODONE-ACETAMINOPHEN (PERCOCET/ROXICET) 5-325 MG PER TABLET    Take 0.5-1 tablets by mouth every 6 (six) hours as needed.   POLYETHYLENE GLYCOL (MIRALAX / GLYCOLAX) PACKET    Take 17 g by mouth daily.   SENNA (SENOKOT) 8.6 MG TABLET    Take 1 tablet by mouth at bedtime.  Modified Medications   No medications on file  Discontinued Medications   CEPHALEXIN (KEFLEX) 500 MG CAPSULE    Take 1 capsule (500 mg total) by mouth 2 (two) times daily.     Physical Exam:  Filed Vitals:   02/21/15 1340  BP: 120/68  Pulse: 92  Temp: 97.7 F (36.5 C)  TempSrc: Oral  Resp: 20  Height: 5\' 5"  (1.651 m)  Weight: 147 lb 3.2 oz (66.769 kg)  SpO2: 96%    Physical Exam  Constitutional: She is oriented to person, place, and time. She appears well-developed and well-nourished. No distress.  HENT:  Head: Normocephalic and atraumatic.  Mouth/Throat: Oropharynx is clear and moist. No oropharyngeal exudate.  Eyes: Conjunctivae are normal. Pupils are equal, round, and reactive to light.  Neck: Normal range of motion. Neck supple.  Cardiovascular: Normal rate, regular rhythm and normal heart sounds.   Pulmonary/Chest: Effort normal and breath sounds normal.  Abdominal: Soft. Bowel sounds are normal.  Musculoskeletal: She exhibits edema (2+ bilaterally). She exhibits no tenderness.  Neurological: She is alert and oriented to person, place, and time.  Skin: Skin is warm and dry. She is not diaphoretic.  Discoloration to forehead from skin removal  Psychiatric: She has a normal mood and affect.    Labs reviewed: Basic Metabolic Panel:  Recent Labs  44/01/02 1653 02/13/15 0500  NA 139 141  K 4.5 4.6  CL 105 107  CO2 23 24  GLUCOSE 91 108*  BUN 24* 31*  CREATININE 0.95 1.15*  CALCIUM 9.0 9.0   Liver Function  Tests:  Recent Labs  12/11/14 1653 02/13/15 0500  AST 21 20  ALT 15 15  ALKPHOS 65 79  BILITOT 0.3 0.6  PROT 7.0 7.4  ALBUMIN 3.8 3.8   No results for input(s): LIPASE, AMYLASE in the last 8760 hours. No results for input(s): AMMONIA in the last 8760 hours. CBC:  Recent Labs  12/11/14 1653 02/13/15 0500  WBC 6.2 9.0  NEUTROABS 4.5 6.9  HGB 10.7* 10.4*  HCT 32.3* 31.8*  MCV 84.3 87.1  PLT 308 258   Lipid Panel: No results for input(s): CHOL, HDL, LDLCALC, TRIG, CHOLHDL,  LDLDIRECT in the last 8760 hours. TSH: No results for input(s): TSH in the last 8760 hours. A1C: No results found for: HGBA1C   Assessment/Plan 1. Melanoma of skin -new lump found, following with oncology and ENT, discussed removal of area but due to age and risk with procedure pt and family did not want to have such aggressive. Ongoing follow up with oncology.   2. Rheumatoid arthritis -discussed stopping advil (due to adverse effects) does not take that frequently so she will stop.  -cont oxycodone PRN pain - oxyCODONE-acetaminophen (PERCOCET/ROXICET) 5-325 MG per tablet; Take 0.5-1 tablets by mouth every 6 (six) hours as needed.  Dispense: 120 tablet; Refill: 0  3. Slow transit constipation -cont colace daily, increase water intake -may use miralax daily or every other day as needed for constipation   4. Edema -unchanged with increased lasix, to stop and use TED hose during the day, elevate LE as tolerate, decrease sodium  5. Preventive measure - DNR (Do Not Resuscitate)  6. Insomnia -discussed adverse effects of benadryl, to stop this and use melatonin 3-6 mg PO qhs as needed  Follow up in 4 weeks for EV with [facility]  Neli Fofana K. Biagio Borg  [facility] [phone] 8 am - 5 pm) [phone] (after hours)

## 2015-03-15 ENCOUNTER — Other Ambulatory Visit: Payer: Self-pay | Admitting: Oncology

## 2015-03-15 ENCOUNTER — Other Ambulatory Visit (HOSPITAL_COMMUNITY)
Admission: RE | Admit: 2015-03-15 | Discharge: 2015-03-15 | Disposition: A | Discharge status: Home / Self Care | Payer: PPO | Source: Ambulatory Visit | Attending: Oncology | Admitting: Oncology

## 2015-03-15 DIAGNOSIS — C439 Malignant melanoma of skin, unspecified: Secondary | ICD-10-CM | POA: Diagnosis present

## 2015-03-21 ENCOUNTER — Encounter (HOSPITAL_COMMUNITY): Payer: Self-pay

## 2015-03-22 ENCOUNTER — Other Ambulatory Visit: Payer: Self-pay | Admitting: *Deleted

## 2015-03-22 ENCOUNTER — Telehealth: Payer: Self-pay | Admitting: *Deleted

## 2015-03-22 NOTE — Telephone Encounter (Signed)
Appointment scheduled with Dr. Mariea Clonts on Monday. Called [name] and left message on her voicemail. Asked her to call back to confirm the appointment.

## 2015-03-22 NOTE — Telephone Encounter (Signed)
See if you can make a sooner appt for acute visit

## 2015-03-22 NOTE — Telephone Encounter (Signed)
Daughter, [name] called and stated that patient has a wound on her head and thinks it is getting infected. Has Melanoma. Sore from the wound down the side of her face and has a red mark coming down her cheek. Patient is continously touching the wound. No fever and no drainage. Has an appointment next Thursday but don't feel this should wait till then. Please Advise.

## 2015-03-25 ENCOUNTER — Telehealth: Payer: Self-pay | Admitting: Oncology

## 2015-03-25 ENCOUNTER — Ambulatory Visit (INDEPENDENT_AMBULATORY_CARE_PROVIDER_SITE_OTHER): Payer: PPO | Admitting: Internal Medicine

## 2015-03-25 ENCOUNTER — Encounter: Payer: Self-pay | Admitting: Internal Medicine

## 2015-03-25 VITALS — BP 120/68 | HR 83 | Temp 98.3°F | Ht 65.0 in | Wt 145.0 lb

## 2015-03-25 DIAGNOSIS — G629 Polyneuropathy, unspecified: Secondary | ICD-10-CM | POA: Diagnosis not present

## 2015-03-25 DIAGNOSIS — M069 Rheumatoid arthritis, unspecified: Secondary | ICD-10-CM

## 2015-03-25 DIAGNOSIS — C439 Malignant melanoma of skin, unspecified: Secondary | ICD-10-CM

## 2015-03-25 NOTE — Progress Notes (Signed)
Patient ID: Patty Walters, female   DOB: 25-Sep-1921, 81 y.o.   MRN: [id number]   Location:  [facility]  Code Status: DNR Goals of Care: Advanced Directive information Does patient have an advance directive?: Yes, Type of Advance Directive: Healthcare Power of [place];Living will;Out of facility DNR (pink MOST or yellow form), Pre-existing out of facility DNR order (yellow form or pink MOST form): Yellow form placed in chart (order not valid for inpatient use), Does patient want to make changes to advanced directive?: No - Patient declined   Chief Complaint  Patient presents with  . Acute Visit    Wound (melanoma) on head, looks infected. Patient is under the care of oncologist   . Orders    Request for chair lift and new wheel chair     HPI: Patient is a 81 y.o. white female pt of [name]'s seen in the office today for wound on her head that may be infected.   She had a lesion on her right forehead 1/2 basal cell and half melanoma.  A lump was found on the side of her face, as well, but she does not want this further investigated.  Pt's daughter, [name], called on 8/19 with concerns that the wound on her face has a red mark streaking down to her cheek.  The pt is always touching it.  She's had no fever or discharge from it.    Has bilateral lymphadenopathy preauricular areas.  Right side has enlarged.  There are other small areas now across her forehead.  They are wondering if the medication options where the side effects would not outweigh the benefits.    She got washed out by [facility] and that's why she is now living with her daughter in a townhouse.  She can't do the stairs.  Her daughter works from home in her office. This will help prevent social isolation.  She has arthritis.    She is not fasting today.  Her daughter was hoping to move everything to today, but the appt was meant to be a physical on Thursday so advised to either keep  that or reschedule it in the near future.   Has neuropathy and that's why she sees podiatry for nail trimming.    Review of Systems:  Review of Systems  Constitutional: Positive for malaise/fatigue. Negative for fever and chills.  HENT: Positive for hearing loss.   Eyes: Negative for blurred vision.  Respiratory: Negative for shortness of breath.   Cardiovascular: Positive for leg swelling. Negative for chest pain.  Gastrointestinal: Negative for abdominal pain.  Genitourinary: Negative for dysuria.  Musculoskeletal: Positive for joint pain. Negative for falls.  Skin:       Malignant melanoma progressing across forehead and right temple area  Neurological: Positive for tingling, sensory change and weakness.  Endo/Heme/Allergies:       Lymphadenopathy in preauricular areas bilaterally that is growing    Past Medical History  Diagnosis Date  . Rheumatoid arthritis(714.0)   . Allergy   . Cancer   . Blood transfusion without reported diagnosis   . Anemia   . Cataract   . UTI (urinary tract infection)   . Melanoma     Past Surgical History  Procedure Laterality Date  . Joint replacement    . Mastectomy Left   . Hip replacement Bilateral   . Appendectomy    . Fracture surgery    . Breast surgery Left 2010    ?  breast cancer    No Known Allergies Medications: Patient's Medications  New Prescriptions   No medications on file  Previous Medications   CALCIUM CARBONATE-VITAMIN D (CALTRATE 600+D) 600-400 MG-UNIT PER TABLET    Take 0.5 tablets by mouth daily.    DOCUSATE SODIUM (COLACE) 100 MG CAPSULE    Take 100 mg by mouth daily.   DULOXETINE (CYMBALTA) 60 MG CAPSULE    Take 1 capsule (60 mg total) by mouth daily.   HYDROXYCHLOROQUINE (PLAQUENIL) 200 MG TABLET    TAKE 1 TABLET (200 MG TOTAL) BY MOUTH DAILY.   MAGNESIUM HYDROXIDE (MILK OF MAGNESIA) 800 MG/5ML SUSPENSION    Take 15 mLs by mouth daily as needed for constipation.   MULTIPLE VITAMIN (MULTIVITAMIN) TABLET     Take 1 tablet by mouth daily.   OXYCODONE-ACETAMINOPHEN (PERCOCET/ROXICET) 5-325 MG PER TABLET    Take 0.5-1 tablets by mouth every 6 (six) hours as needed.   POLYETHYLENE GLYCOL (MIRALAX / GLYCOLAX) PACKET    Take 17 g by mouth daily as needed.    SENNA (SENOKOT) 8.6 MG TABLET    Take 2 tablets by mouth at bedtime as needed.   Modified Medications   No medications on file  Discontinued Medications   No medications on file    Physical Exam: Filed Vitals:   03/25/15 1218  BP: 120/68  Pulse: 83  Temp: 98.3 F (36.8 C)  TempSrc: Oral  Height: 5\' 5"  (1.651 m)  Weight: 145 lb (65.772 kg)  SpO2: 96%   Physical Exam  Constitutional: She appears well-developed and well-nourished. No distress.  Cardiovascular: Normal rate, regular rhythm, normal heart sounds and intact distal pulses.   Pulmonary/Chest: Effort normal and breath sounds normal. No respiratory distress.  Abdominal: Bowel sounds are normal.  Musculoskeletal:  Seating in wheelchair  Neurological: She is alert.  Skin:  Erythematous raised areas mostly now closed primarily of right temple and into hairline on right, but a few small raised erythematous areas irregular in shape are developing in the middle of her forehead    Labs reviewed: Basic Metabolic Panel:  Recent Labs  57/84/69 1653 02/13/15 0500  NA 139 141  K 4.5 4.6  CL 105 107  CO2 23 24  GLUCOSE 91 108*  BUN 24* 31*  CREATININE 0.95 1.15*  CALCIUM 9.0 9.0   Liver Function Tests:  Recent Labs  12/11/14 1653 02/13/15 0500  AST 21 20  ALT 15 15  ALKPHOS 65 79  BILITOT 0.3 0.6  PROT 7.0 7.4  ALBUMIN 3.8 3.8   No results for input(s): LIPASE, AMYLASE in the last 8760 hours. No results for input(s): AMMONIA in the last 8760 hours. CBC:  Recent Labs  12/11/14 1653 02/13/15 0500  WBC 6.2 9.0  NEUTROABS 4.5 6.9  HGB 10.7* 10.4*  HCT 32.3* 31.8*  MCV 84.3 87.1  PLT 308 258   Lipid Panel: No results for input(s): CHOL, HDL, LDLCALC, TRIG,  CHOLHDL, LDLDIRECT in the last 8760 hours. No results found for: HGBA1C   Assessment/Plan 1. Peripheral neuropathy - ongoing, nails are too thick and pt without sensation so referral to podiatry for trimming and evaluation was placed - Ambulatory referral to Podiatry  2. Melanoma of skin -only known to be locally metastatic with involvement of lymph node in parotid/perauricular areas on right, but now spreading locally across forehead and new perauricular area on left side -keep appt with oncology, Dr. Truett Perna to discuss treatment options, but she does not want anything that will affect  her function and qol -they also want to know what they can put over the area safely for appearance purposes (been using scarves and hats)--I would think any makeup would be ok if the lesions are all closed  3. Rheumatoid arthritis -has chronic joint pains related to this and her function is primarily limited by this -lift chair for stairs Rx was written for them  Labs/tests ordered: keep onc f/u Next appt: f/u with Shanda Bumps for physical  Kydan Shanholtzer L. Treylen Gibbs, D.O. Geriatrics [facility] [address] [place], [place] [address] Cell Phone (Mon-Fri 8am-5pm):  [phone] On Call:  [phone] & follow prompts after 5pm & weekends Office Phone:  [phone] Office Fax:  [phone]

## 2015-03-25 NOTE — Telephone Encounter (Signed)
Confirmed appointment appointment 09/02. Patient request a sooner appointment than 09/02 due to some changes. Patient is schedule to see PCP first and go from there.

## 2015-03-28 ENCOUNTER — Encounter: Payer: PPO | Admitting: Nurse Practitioner

## 2015-04-04 ENCOUNTER — Ambulatory Visit: Payer: PPO | Admitting: Nurse Practitioner

## 2015-04-05 ENCOUNTER — Telehealth: Payer: Self-pay | Admitting: Oncology

## 2015-04-05 ENCOUNTER — Ambulatory Visit (HOSPITAL_BASED_OUTPATIENT_CLINIC_OR_DEPARTMENT_OTHER): Payer: PPO | Admitting: Nurse Practitioner

## 2015-04-05 VITALS — BP 121/51 | HR 85 | Temp 98.5°F | Resp 18 | Ht 65.0 in | Wt 144.0 lb

## 2015-04-05 DIAGNOSIS — C779 Secondary and unspecified malignant neoplasm of lymph node, unspecified: Secondary | ICD-10-CM | POA: Diagnosis not present

## 2015-04-05 DIAGNOSIS — C439 Malignant melanoma of skin, unspecified: Secondary | ICD-10-CM

## 2015-04-05 NOTE — Progress Notes (Addendum)
[place] OFFICE PROGRESS NOTE   Diagnosis:  Melanoma  INTERVAL HISTORY:   Ms. Patty Walters returns prior to scheduled follow-up. About 2 weeks ago her daughter noted redness over the right side of Ms. Speil's face. The redness resolved. Since then she has noted a "migrating wound" across her forehead. She thinks the right preauricular mass is larger. Ms. Patty Walters has some pain over the forehead. She has medication to take as needed for pain. She reports a good appetite.  Objective:  Vital signs in last 24 hours:  Blood pressure 121/51, pulse 85, temperature 98.5 F (36.9 C), temperature source Oral, resp. rate 18, height $RemoveBe'5\' 5"'[id number]$  (1.651 m), weight 144 lb (65.318 kg), SpO2 100 %.    HEENT: No thrush or ulcers. No neck mass. 3 cm subcutaneous right preauricular mass. 1 cm subcutaneous mass superior to the larger mass. 1 cm left preauricular mass. Lymphatics: No palpable cervical, supra clavicular, axillary or inguinal nodes. Resp: Lungs clear bilaterally. Cardio: Regular rate and rhythm. GI: Abdomen soft and nontender. Hepatomegaly. Vascular: Pitting edema at the lower legs bilaterally right greater than left.  Skin: Multiple superficial ulcers over the right forehead. Multiple firm nodules scattered over the right forehead extending to the scalp.    Lab Results:  Lab Results  Component Value Date   WBC 9.0 02/13/2015   HGB 10.4* 02/13/2015   HCT 31.8* 02/13/2015   MCV 87.1 02/13/2015   PLT 258 02/13/2015   NEUTROABS 6.9 02/13/2015    Imaging:  No results found.  Medications: I have reviewed the patient's current medications.  Assessment/Plan: 1. Malignant melanoma-right for head lesion shave biopsy confirmed a malignant spindle cell neoplasm 10/04/2014, biopsy of a right preauricular mass 12/12/2014 confirmed metastatic melanoma involving a lymph node; BRAF mutation not detected.  Staging CT scans 01/03/2015 revealed a 1.5 cm right preauricular mass with an  adjacent 4 mm subcutaneous nodule, no evidence of distant metastatic disease  04/05/2015 progressive nodularity over the forehead, increased right and left preauricular masses.  2. Rheumatoid arthritis  3. History of left-sided breast cancer, status post left mastectomy followed by hormonal therapy  4. Chronic anemia  5. Family history of melanoma  6. Raised purple lesion at the posterior left tongue  7. Escherichia coli UTI 02/13/2015    Disposition: Ms. Patty Walters has evidence of progressive melanoma over the forehead/anterior scalp and involving preauricular masses. Dr. Benay Spice recommends initiation of systemic therapy with nivolumab. We reviewed potential toxicities associated with nivolumab including allergic reaction, diarrhea, skin rash, hepatotoxicity, thyroid disorder, hypophysitis. Her daughter is interested in a second opinion with Dr. Harriet Masson at [facility]. We made a referral to Dr. Harriet Masson. We decided to go ahead and schedule the first cycle of nivolumab on 04/23/2015 and will make changes accordingly pending recommendations from Dr. Harriet Masson. She will return for a follow-up visit on 04/23/2015. She will contact the office in the interim with any problems.  Dr. Benay Spice reviewed the above with Ms. Speils other daughter by telephone.  Patient seen with Dr. Benay Spice. 25 minutes were spent face-to-face at today's visit with the majority of that time involved in counseling/coordination of care.  Ned Card ANP/GNP-BC   04/05/2015  3:12 PM  This was a shared visit with Ned Card. Ms. Bouchie was examined. She has progressive metastatic melanoma involving multiple cutaneous nodules over the face. I discussed treatment options with Ms. Heather and her daughter. No BRAF mutation was detected in the tumor. I recommend treatment with nivolumab. We reviewed the potential toxicities  associated with this agent. She will seek a second opinion with Dr. Harriet Masson at [facility]. I do not  recommend further CT scans since there is palpable measurable disease over the face.  She will be scheduled for a chemotherapy teaching class and first cycle of treatment on 04/23/2015. We will see her for an office visit 04/23/2015.  Julieanne Manson, M.D.

## 2015-04-05 NOTE — Telephone Encounter (Signed)
Gave adn printed appt sched and avs for pt for SEpt  °

## 2015-04-05 NOTE — Patient Instructions (Signed)
Nivolumab injection What is this medicine? NIVOLUMAB (nye VOL ue mab) is used to treat certain types of melanoma and lung cancer. This medicine may be used for other purposes; ask your health care provider or pharmacist if you have questions. COMMON BRAND NAME(S): Opdivo What should I tell my health care provider before I take this medicine? They need to know if you have any of these conditions: -eye disease, vision problems -history of pancreatitis -immune system problems -inflammatory bowel disease -kidney disease -liver disease -lung disease -lupus -myasthenia gravis -multiple sclerosis -organ transplant -stomach or intestine problems -thyroid disease -tingling of the fingers or toes, or other nerve disorder -an unusual or allergic reaction to nivolumab, other medicines, foods, dyes, or preservatives -pregnant or trying to get pregnant -breast-feeding How should I use this medicine? This medicine is for infusion into a vein. It is given by a health care professional in a hospital or clinic setting. A special MedGuide will be given to you before each treatment. Be sure to read this information carefully each time. Talk to your pediatrician regarding the use of this medicine in children. Special care may be needed. Overdosage: If you think you've taken too much of this medicine contact a poison control center or emergency room at once. Overdosage: If you think you have taken too much of this medicine contact a poison control center or emergency room at once. NOTE: This medicine is only for you. Do not share this medicine with others. What if I miss a dose? It is important not to miss your dose. Call your doctor or health care professional if you are unable to keep an appointment. What may interact with this medicine? Interactions have not been studied. This list may not describe all possible interactions. Give your health care provider a list of all the medicines, herbs,  non-prescription drugs, or dietary supplements you use. Also tell them if you smoke, drink alcohol, or use illegal drugs. Some items may interact with your medicine. What should I watch for while using this medicine? Tell your doctor or healthcare professional if your symptoms do not start to get better or if they get worse. Your condition will be monitored carefully while you are receiving this medicine. You may need blood work done while you are taking this medicine. What side effects may I notice from receiving this medicine? Side effects that you should report to your doctor or health care professional as soon as possible: -allergic reactions like skin rash, itching or hives, swelling of the face, lips, or tongue -black, tarry stools -bloody or watery diarrhea -changes in vision -chills -cough -depressed mood -eye pain -feeling anxious -fever -general ill feeling or flu-like symptoms -hair loss -loss of appetite -low blood counts - this medicine may decrease the number of white blood cells, red blood cells and platelets. You may be at increased risk for infections and bleeding -pain, tingling, numbness in the hands or feet -redness, blistering, peeling or loosening of the skin, including inside the mouth -red pinpoint spots on skin -signs of decreased platelets or bleeding - bruising, pinpoint red spots on the skin, black, tarry stools, blood in the urine -signs of decreased red blood cells - unusually weak or tired, feeling faint or lightheaded, falls -signs of infection - fever or chills, cough, sore throat, pain or trouble passing urine -signs and symptoms of a dangerous change in heartbeat or heart rhythm like chest pain; dizziness; fast or irregular heartbeat; palpitations; feeling faint or lightheaded, falls; breathing problems -signs   and symptoms of high blood sugar such as dizziness; dry mouth; dry skin; fruity breath; nausea; stomach pain; increased hunger or thirst; increased  urination -signs and symptoms of kidney injury like trouble passing urine or change in the amount of urine -signs and symptoms of liver injury like dark yellow or brown urine; general ill feeling or flu-like symptoms; light-colored stools; loss of appetite; nausea; right upper belly pain; unusually weak or tired; yellowing of the eyes or skin -signs and symptoms of increased potassium like muscle weakness; chest pain; or fast, irregular heartbeat -signs and symptoms of low potassium like muscle cramps or muscle pain; chest pain; dizziness; feeling faint or lightheaded, falls; palpitations; breathing problems; or fast, irregular heartbeat -swelling of the ankles, feet, hands -weight gainSide effects that usually do not require medical attention (report to your doctor or health care professional if they continue or are bothersome): -constipation -general ill feeling or flu-like symptoms -hair loss -loss of appetite -nausea, vomiting This list may not describe all possible side effects. Call your doctor for medical advice about side effects. You may report side effects to FDA at 1-800-FDA-1088. Where should I keep my medicine? This drug is given in a hospital or clinic and will not be stored at home. NOTE: This sheet is a summary. It may not cover all possible information. If you have questions about this medicine, talk to your doctor, pharmacist, or health care provider.  2015, Elsevier/Gold Standard. (2013-10-09 13:18:19)  

## 2015-04-06 ENCOUNTER — Encounter: Payer: Self-pay | Admitting: Internal Medicine

## 2015-04-10 ENCOUNTER — Other Ambulatory Visit: Payer: Self-pay | Admitting: Nurse Practitioner

## 2015-04-10 ENCOUNTER — Telehealth: Payer: Self-pay | Admitting: Oncology

## 2015-04-10 NOTE — Telephone Encounter (Signed)
s.w. pt dtr and advised on added appts...ok and aware

## 2015-04-11 ENCOUNTER — Encounter: Payer: Self-pay | Admitting: Nurse Practitioner

## 2015-04-11 ENCOUNTER — Ambulatory Visit (INDEPENDENT_AMBULATORY_CARE_PROVIDER_SITE_OTHER): Payer: PPO | Admitting: Nurse Practitioner

## 2015-04-11 ENCOUNTER — Telehealth: Payer: Self-pay | Admitting: Oncology

## 2015-04-11 VITALS — BP 110/60 | HR 84 | Temp 98.0°F | Resp 14 | Ht 65.0 in | Wt 144.0 lb

## 2015-04-11 DIAGNOSIS — Z0000 Encounter for general adult medical examination without abnormal findings: Secondary | ICD-10-CM | POA: Diagnosis not present

## 2015-04-11 DIAGNOSIS — M069 Rheumatoid arthritis, unspecified: Secondary | ICD-10-CM

## 2015-04-11 DIAGNOSIS — H6121 Impacted cerumen, right ear: Secondary | ICD-10-CM

## 2015-04-11 DIAGNOSIS — B372 Candidiasis of skin and nail: Secondary | ICD-10-CM

## 2015-04-11 DIAGNOSIS — Z23 Encounter for immunization: Secondary | ICD-10-CM | POA: Diagnosis not present

## 2015-04-11 DIAGNOSIS — C439 Malignant melanoma of skin, unspecified: Secondary | ICD-10-CM

## 2015-04-11 MED ORDER — NYSTATIN 100000 UNIT/GM EX POWD: CUTANEOUS | Status: DC

## 2015-04-11 MED ORDER — TETANUS-DIPHTH-ACELL PERTUSSIS 5-2.5-18.5 LF-MCG/0.5 IM SUSP
0.5000 mL | Freq: Once | INTRAMUSCULAR | Status: DC
Start: 2015-04-11 — End: 2015-12-16

## 2015-04-11 NOTE — Progress Notes (Signed)
Failed clock drawing  

## 2015-04-11 NOTE — Telephone Encounter (Signed)
Pt appt with Dr. Harriet Masson is 04/18/15@1 :54. Called pt's dtr left vm in ref. To appt. Medical records faxed. Slides and scans fedex'ed

## 2015-04-11 NOTE — Progress Notes (Signed)
Patient ID: Patty Walters, female   DOB: 03/26/1922, 81 y.o.   MRN: [id number]    PCP: Patty Seller, NP  No Known Allergies  Chief Complaint  Patient presents with  . Annual Exam    Yearly check-up, MMSE 27/30, failed clock drawing, no recent labs   . Immunizations    Flu vaccine today      HPI: Patient is a 81 y.o. female seen in the office today for wellness exam.  Here with daughter who she lives with. Pt. Last annual exam was in June 2015.  Pt with a pmh of anemia of chronic disease, RA, depression, edema, constipation, bilateral hip replacement (after failure from hip repair), melanoma followed by oncology.  Also followed by Dr Patty Walters for dermatology, Dr Patty Walters ENT, [facility]     Screenings: Colon Cancer- last one in 2008 Breast Cancer- no further mammogram, hx of left breast cancer. Monitoring by oncology Cervical Cancer- aged out Osteoporosis- Dexa Scan- unknown, would not want treatment  Depression screening Depression screen Patty Walters 2/9 04/11/2015 02/21/2015 12/11/2014 01/04/2014 10/05/2013  Decreased Interest 0 0 0 0 0  Down, Depressed, Hopeless 0 0 0 1 0  PHQ - 2 Score 0 0 0 1 0   Falls Fall Risk  04/11/2015 03/25/2015 02/21/2015 12/11/2014 01/04/2014  Falls in the past year? Yes Yes Yes No No  Number falls in past yr: 1 1 1  - -  Injury with Fall? No No Yes - -  Risk for fall due to : - - - - -   MMSE MMSE - Mini Mental State Exam 04/11/2015  Orientation to time 5  Orientation to Place 3  Registration 3  Attention/ Calculation 5  Recall 2  Language- name 2 objects 2  Language- repeat 1  Language- follow 3 step command 3  Language- read & follow direction 1  Write a sentence 1  Copy design 1  Total score 27    No depression noted  Vaccines Up to date on: pneumococcal  Need:  Influenza, Tdap,   Smoking status:.never smoked Alcohol use: red wine at night, 1 glass  Dentist: not recently ~ 1 year ago Ophthalmologist: over a year ago, due to  visit  Exercise regimen: -none Diet: -liberalized diet, good eater, fresh fruits and vegetables   Functional Status of ADLs: Toileting-  independent  Bathing, dressing, preparing meals- needs assistance  Daughter does iADLs  Advanced Directive information Does patient have an advance directive?: Yes, Type of Advance Directive: Healthcare Power of [place];Living will;Out of facility DNR (pink MOST or yellow form) Review of Systems:  Review of Systems  Constitutional: Negative for chills, diaphoresis, activity change, appetite change, fatigue and unexpected weight change.  HENT: Negative for congestion and hearing loss.   Eyes: Negative.        Hx of blepharitis, over due for eye doctor Dry eyes    Respiratory: Negative for cough and shortness of breath.   Cardiovascular: Negative for chest pain, palpitations and leg swelling.  Gastrointestinal: Positive for abdominal pain (right lower quad that comes in goes for many many years) and constipation. Negative for diarrhea.  Genitourinary: Negative for dysuria and difficulty urinating.  Musculoskeletal: Positive for arthralgias. Negative for myalgias.       RA- pain in legs, right shoulder and back Uses walker at home.   Skin: Negative for color change and wound.       Hx of melanoma, skin discoloration on her face. Cancer has spread  Neurological: Negative for dizziness and weakness.  Psychiatric/Behavioral: Negative for behavioral problems, confusion, sleep disturbance and agitation.    Past Medical History  Diagnosis Date  . Rheumatoid arthritis(714.0)   . Allergy   . Cancer   . Blood transfusion without reported diagnosis   . Anemia   . Cataract   . UTI (urinary tract infection)   . Melanoma    Past Surgical History  Procedure Laterality Date  . Joint replacement    . Mastectomy Left   . Hip replacement Bilateral   . Appendectomy    . Fracture surgery    . Breast surgery Left 2010    ? breast cancer   Social  History:   reports that she has never smoked. She has never used smokeless tobacco. She reports that she drinks about 0.6 oz of alcohol per week. She reports that she does not use illicit drugs.  Family History  Problem Relation Age of Onset  . Emphysema Father     smoked and was exposed to mustard gas  . COPD Father   . Cancer Sister     esophageal  . Hypertension Daughter   . Hypertension Son   . Cancer Brother   . Cancer Sister     Medications: Patient's Medications  New Prescriptions   No medications on file  Previous Medications   DOCUSATE SODIUM (COLACE) 100 MG CAPSULE    Take 100 mg by mouth daily.   DULOXETINE (CYMBALTA) 60 MG CAPSULE    Take 1 capsule (60 mg total) by mouth daily.   HYDROXYCHLOROQUINE (PLAQUENIL) 200 MG TABLET    TAKE 1 TABLET (200 MG TOTAL) BY MOUTH DAILY.   MAGNESIUM HYDROXIDE (MILK OF MAGNESIA) 800 MG/5ML SUSPENSION    Take 15 mLs by mouth daily as needed for constipation.   MULTIPLE VITAMIN (MULTIVITAMIN) TABLET    Take 1 tablet by mouth daily.   OXYCODONE-ACETAMINOPHEN (PERCOCET/ROXICET) 5-325 MG PER TABLET    Take 0.5-1 tablets by mouth every 6 (six) hours as needed.   POLYETHYLENE GLYCOL (MIRALAX / GLYCOLAX) PACKET    Take 17 g by mouth daily as needed.    SENNA (SENOKOT) 8.6 MG TABLET    Take 2 tablets by mouth at bedtime as needed.   Modified Medications   No medications on file  Discontinued Medications   CALCIUM CARBONATE-VITAMIN D (CALTRATE 600+D) 600-400 MG-UNIT PER TABLET    Take 0.5 tablets by mouth daily.      Physical Exam:  Filed Vitals:   04/11/15 1343  BP: 110/60  Pulse: 84  Temp: 98 F (36.7 C)  TempSrc: Oral  Resp: 14  Height: 5\' 5"  (1.651 m)  Weight: 144 lb (65.318 kg)  SpO2: 96%    Physical Exam  Constitutional: She is oriented to person, place, and time. She appears well-developed and well-nourished. No distress.  HENT:  Head: Normocephalic and atraumatic.  Mouth/Throat: Oropharynx is clear and moist. No  oropharyngeal exudate.  Eyes: Conjunctivae are normal. Pupils are equal, round, and reactive to light.  Neck: Normal range of motion. Neck supple.  Cardiovascular: Normal rate, regular rhythm, normal heart sounds and intact distal pulses.   Pulmonary/Chest: Effort normal and breath sounds normal. No respiratory distress.  Abdominal: Soft. Bowel sounds are normal.  Musculoskeletal: She exhibits edema (2+ bilaterally). She exhibits no tenderness.  Limited ROM to bilateral hips.   Lymphadenopathy:    She has cervical adenopathy.  Neurological: She is alert and oriented to person, place, and time.  Skin: Skin  is warm and dry. She is not diaphoretic.  Discoloration to forehead from skin removal  Psychiatric: She has a normal mood and affect.    Labs reviewed: Basic Metabolic Panel:  Recent Labs  16/10/96 1653 02/13/15 0500  NA 139 141  K 4.5 4.6  CL 105 107  CO2 23 24  GLUCOSE 91 108*  BUN 24* 31*  CREATININE 0.95 1.15*  CALCIUM 9.0 9.0   Liver Function Tests:  Recent Labs  12/11/14 1653 02/13/15 0500  AST 21 20  ALT 15 15  ALKPHOS 65 79  BILITOT 0.3 0.6  PROT 7.0 7.4  ALBUMIN 3.8 3.8   No results for input(s): LIPASE, AMYLASE in the last 8760 hours. No results for input(s): AMMONIA in the last 8760 hours. CBC:  Recent Labs  12/11/14 1653 02/13/15 0500  WBC 6.2 9.0  NEUTROABS 4.5 6.9  HGB 10.7* 10.4*  HCT 32.3* 31.8*  MCV 84.3 87.1  PLT 308 258   Lipid Panel: No results for input(s): CHOL, HDL, LDLCALC, TRIG, CHOLHDL, LDLDIRECT in the last 8760 hours. TSH: No results for input(s): TSH in the last 8760 hours. A1C: No results found for: HGBA1C   Assessment/Plan  1. Medicare annual wellness visit, subsequent  The patient is doing well and no NEW problems were identified on exam. -no further preventive care.  -lives with daughter who helps with ADLs -no recent falls -no depression -discussed increase in activity and diet modifications.   2. Skin  yeast infection To groin area, educated to keep area clean and dry - nystatin (MYCOSTATIN/NYSTOP) 100000 UNIT/GM POWD; Twice daily as needed to affected area  Dispense: 15 g; Refill: 0  3. Melanoma of skin Following with oncology, plans to do infusion due to local metastatic with involvement of lymph node in parotid/perauricular areas on right, new recent left periauricular nodes.   4. Rheumatoid arthritis -pain well controlled, conts on cymbalta  5. Need for prophylactic vaccination and inoculation against influenza - Flu Vaccine QUAD 36+ mos PF IM (Fluarix & Fluzone Quad PF)  Follow up in 3 months, sooner if needed  Gerarda Conklin K. Biagio Borg  [facility] [phone] 8 am - 5 pm) [phone] (after hours)

## 2015-04-11 NOTE — Patient Instructions (Signed)
To try to get some exercise in daily- 30 mins 5 days a week- can do chair exercise   Follow up in 3 months

## 2015-04-12 ENCOUNTER — Ambulatory Visit (INDEPENDENT_AMBULATORY_CARE_PROVIDER_SITE_OTHER): Payer: PPO | Admitting: Podiatry

## 2015-04-12 ENCOUNTER — Encounter: Payer: Self-pay | Admitting: Podiatry

## 2015-04-12 DIAGNOSIS — B351 Tinea unguium: Secondary | ICD-10-CM

## 2015-04-12 DIAGNOSIS — M79673 Pain in unspecified foot: Secondary | ICD-10-CM | POA: Diagnosis not present

## 2015-04-12 DIAGNOSIS — G629 Polyneuropathy, unspecified: Secondary | ICD-10-CM

## 2015-04-12 DIAGNOSIS — M79609 Pain in unspecified limb: Principal | ICD-10-CM

## 2015-04-12 NOTE — Progress Notes (Signed)
   Subjective:    Patient ID: Patty Walters, female    DOB: 21-Nov-1921, 81 y.o.   MRN: [id number]  HPI  PT REQUESTING FOR TOENAILS DEBRIDEMENT. This patient presents to the office for nail care for both feet. She says her nails are painful walking and wearing her shoes.  She has significant swelling both feet. Review of Systems  HENT: Positive for hearing loss.   Cardiovascular: Positive for leg swelling.  Musculoskeletal: Positive for joint swelling and gait problem.       Objective:   Physical Exam GENERAL APPEARANCE: Alert, conversant. Appropriately groomed. No acute distress.  VASCULAR: Pedal pulses not  palpable at  [facility] and PT bilateral due to severity of swelling both feet.  Capillary refill time is immediate to all digits,  Cold feet noted.  NEUROLOGIC: sensation is normal to 5.07 monofilament at 5/5 sites bilateral.  Light touch is intact bilateral, Muscle strength normal.  MUSCULOSKELETAL: acceptable muscle strength, tone and stability bilateral.  Intrinsic muscluature intact bilateral.  Rectus appearance of foot and digits noted bilateral.   DERMATOLOGIC: skin color, texture, and turgor are within normal limits.  No preulcerative lesions or ulcers  are seen, no interdigital maceration noted.  No open lesions present.   No drainage noted. NAILS  Thick disfigured discolored nails both feet.        Assessment & Plan:  Onychomycosis  B/L  IE  Debridement of nails  RTC 3 months.

## 2015-04-16 ENCOUNTER — Telehealth: Payer: Self-pay | Admitting: *Deleted

## 2015-04-16 ENCOUNTER — Other Ambulatory Visit: Payer: PPO

## 2015-04-16 ENCOUNTER — Other Ambulatory Visit: Payer: Self-pay | Admitting: Nurse Practitioner

## 2015-04-16 DIAGNOSIS — M069 Rheumatoid arthritis, unspecified: Secondary | ICD-10-CM

## 2015-04-16 NOTE — Telephone Encounter (Signed)
No additional note

## 2015-04-17 MED ORDER — OXYCODONE-ACETAMINOPHEN 5-325 MG PO TABS: 0.5000 | ORAL_TABLET | Freq: Four times a day (QID) | ORAL | Status: DC | PRN

## 2015-04-18 ENCOUNTER — Ambulatory Visit: Payer: PPO

## 2015-04-21 ENCOUNTER — Other Ambulatory Visit: Payer: Self-pay | Admitting: Oncology

## 2015-04-22 ENCOUNTER — Other Ambulatory Visit: Payer: PPO

## 2015-04-22 ENCOUNTER — Ambulatory Visit: Payer: PPO

## 2015-04-22 ENCOUNTER — Ambulatory Visit: Payer: PPO | Admitting: Nurse Practitioner

## 2015-04-23 ENCOUNTER — Ambulatory Visit (INDEPENDENT_AMBULATORY_CARE_PROVIDER_SITE_OTHER): Payer: PPO | Admitting: *Deleted

## 2015-04-23 DIAGNOSIS — H6123 Impacted cerumen, bilateral: Secondary | ICD-10-CM

## 2015-04-23 NOTE — Patient Instructions (Addendum)
Ear lavage done on patient's ears, small chunk of wax was removed from the right ear.. Instructed patient and daughter to continue to add a couple drops of Debrox to her ears every other night until she goes to see oncologist on Thursday. The daughter stated that she would have that doctor evaluate her ears due to the tumor located near her ears.

## 2015-04-26 ENCOUNTER — Other Ambulatory Visit: Payer: Self-pay | Admitting: Nurse Practitioner

## 2015-04-26 ENCOUNTER — Telehealth: Payer: Self-pay | Admitting: Nurse Practitioner

## 2015-04-26 NOTE — Telephone Encounter (Signed)
Lft msg for pt confirming MD visit, mailed out schedule... KJ

## 2015-04-29 NOTE — Addendum Note (Signed)
Addended by: Logan Bores on: 04/29/2015 02:59 PM   Modules accepted: Orders

## 2015-05-01 ENCOUNTER — Telehealth: Payer: Self-pay

## 2015-05-01 NOTE — Telephone Encounter (Signed)
Confirmed with Nevin Bloodgood from [facility] that the nivolumab has been authorized by [employer].  Nevin Bloodgood reports the nivolumab is the backup plan in the event pt cellulitis does not clear up and thus injectable treatment will not be possible.

## 2015-05-13 ENCOUNTER — Telehealth: Payer: Self-pay | Admitting: Oncology

## 2015-05-13 NOTE — Telephone Encounter (Signed)
Returned dtr call re cancelling f/u 10/11 due to patient being txd at [facility]. Per dtr she will get w/[facility] re when to see BS again. Message forwarded to desk nurse.

## 2015-05-14 ENCOUNTER — Ambulatory Visit: Payer: PPO | Admitting: Nurse Practitioner

## 2015-05-21 ENCOUNTER — Other Ambulatory Visit: Payer: Self-pay | Admitting: Nurse Practitioner

## 2015-05-21 ENCOUNTER — Other Ambulatory Visit: Payer: Self-pay | Admitting: Internal Medicine

## 2015-05-21 DIAGNOSIS — B372 Candidiasis of skin and nail: Secondary | ICD-10-CM

## 2015-05-21 DIAGNOSIS — M069 Rheumatoid arthritis, unspecified: Secondary | ICD-10-CM

## 2015-05-21 MED ORDER — NYSTATIN 100000 UNIT/GM EX POWD: CUTANEOUS | Status: DC

## 2015-05-21 MED ORDER — OXYCODONE-ACETAMINOPHEN 5-325 MG PO TABS: 0.5000 | ORAL_TABLET | Freq: Four times a day (QID) | ORAL | Status: DC | PRN

## 2015-06-03 ENCOUNTER — Emergency Department (HOSPITAL_COMMUNITY): Payer: PPO

## 2015-06-03 ENCOUNTER — Encounter (HOSPITAL_COMMUNITY): Payer: Self-pay | Admitting: *Deleted

## 2015-06-03 ENCOUNTER — Telehealth: Payer: Self-pay | Admitting: *Deleted

## 2015-06-03 ENCOUNTER — Emergency Department (HOSPITAL_COMMUNITY)
Admission: EM | Admit: 2015-06-03 | Discharge: 2015-06-03 | Disposition: A | Discharge status: Home / Self Care | Payer: PPO | Attending: Emergency Medicine | Admitting: Emergency Medicine

## 2015-06-03 DIAGNOSIS — R0600 Dyspnea, unspecified: Secondary | ICD-10-CM | POA: Diagnosis not present

## 2015-06-03 DIAGNOSIS — Z85820 Personal history of malignant melanoma of skin: Secondary | ICD-10-CM | POA: Insufficient documentation

## 2015-06-03 DIAGNOSIS — Z79899 Other long term (current) drug therapy: Secondary | ICD-10-CM | POA: Diagnosis not present

## 2015-06-03 DIAGNOSIS — M069 Rheumatoid arthritis, unspecified: Secondary | ICD-10-CM | POA: Insufficient documentation

## 2015-06-03 DIAGNOSIS — H269 Unspecified cataract: Secondary | ICD-10-CM | POA: Diagnosis not present

## 2015-06-03 DIAGNOSIS — R600 Localized edema: Secondary | ICD-10-CM | POA: Diagnosis not present

## 2015-06-03 DIAGNOSIS — Z862 Personal history of diseases of the blood and blood-forming organs and certain disorders involving the immune mechanism: Secondary | ICD-10-CM | POA: Insufficient documentation

## 2015-06-03 DIAGNOSIS — R0602 Shortness of breath: Secondary | ICD-10-CM | POA: Diagnosis present

## 2015-06-03 DIAGNOSIS — Z87440 Personal history of urinary (tract) infections: Secondary | ICD-10-CM | POA: Diagnosis not present

## 2015-06-03 LAB — BASIC METABOLIC PANEL
Anion gap: 8 (ref 5–15)
BUN: 23 mg/dL — ABNORMAL HIGH (ref 6–20)
CO2: 22 mmol/L (ref 22–32)
Calcium: 9.1 mg/dL (ref 8.9–10.3)
Chloride: 111 mmol/L (ref 101–111)
Creatinine, Ser: 1.03 mg/dL — ABNORMAL HIGH (ref 0.44–1.00)
GFR calc Af Amer: 53 mL/min — ABNORMAL LOW (ref >60)
GFR calc non Af Amer: 46 mL/min — ABNORMAL LOW (ref >60)
Glucose, Bld: 113 mg/dL — ABNORMAL HIGH (ref 65–99)
Potassium: 4 mmol/L (ref 3.5–5.1)
Sodium: 141 mmol/L (ref 135–145)

## 2015-06-03 LAB — HEPATIC FUNCTION PANEL
ALBUMIN: 3.8 g/dL (ref 3.5–5.0)
ALK PHOS: 65 U/L (ref 38–126)
ALT: 23 U/L (ref 14–54)
AST: 30 U/L (ref 15–41)
Bilirubin, Direct: <0.1 mg/dL — ABNORMAL LOW (ref 0.1–0.5)
TOTAL PROTEIN: 7.5 g/dL (ref 6.5–8.1)
Total Bilirubin: 0.4 mg/dL (ref 0.3–1.2)

## 2015-06-03 LAB — CBC
HEMATOCRIT: 29.2 % — AB (ref 36.0–46.0)
Hemoglobin: 9.4 g/dL — ABNORMAL LOW (ref 12.0–15.0)
MCH: 27.3 pg (ref 26.0–34.0)
MCHC: 32.2 g/dL (ref 30.0–36.0)
MCV: 84.9 fL (ref 78.0–100.0)
PLATELETS: 272 10*3/uL (ref 150–400)
RBC: 3.44 MIL/uL — ABNORMAL LOW (ref 3.87–5.11)
RDW: 14.7 % (ref 11.5–15.5)
WBC: 7.8 10*3/uL (ref 4.0–10.5)

## 2015-06-03 LAB — I-STAT CG4 LACTIC ACID, ED
Lactic Acid, Venous: 0.86 mmol/L (ref 0.5–2.0)
Lactic Acid, Venous: 0.58 mmol/L (ref 0.5–2.0)

## 2015-06-03 LAB — I-STAT TROPONIN, ED: Troponin i, poc: 0.04 ng/mL (ref 0.00–0.08)

## 2015-06-03 LAB — BRAIN NATRIURETIC PEPTIDE: B Natriuretic Peptide: 291.1 pg/mL — ABNORMAL HIGH (ref 0.0–100.0)

## 2015-06-03 MED ORDER — IOHEXOL 350 MG/ML SOLN
75.0000 mL | Freq: Once | INTRAVENOUS | Status: AC | PRN
  Administered 2015-06-03: 75 mL via INTRAVENOUS

## 2015-06-03 NOTE — Telephone Encounter (Signed)
[name], daughter called and stated that patient has been getting treatments for melanoma cancer at [facility] and ever since she got one last Wednesday she has been complaining about SOB. I spoke with daughter and she stated that she is going to take patient to the Urgent Care to get chest x-ray done. Agreed.

## 2015-06-03 NOTE — ED Notes (Addendum)
Pt and family report pt is currently being treated for melanoma on her forehead, now melanoma is in lymph system. CT scan in June showed no further progression. Pt is currently under the treatment of Dr Collichio who is injecting forehead wounds with Imlygic, last injection on Wednesday 10/26, bandages must stay on for 1 week. Pt started becoming SOB on Saturday. Able to speak in full sentences. daugther called doctor today and SOB is not a side effect of Imlygic medicine.  Denies pain. Pt has yellow/ green liquid in wound on head, rn had PA look at wound and PA thinks wound is infected.  Pt has been unsteady due to Imlygic medicine, can get flu like symptoms. Chronic leg pain is normal.

## 2015-06-03 NOTE — Discharge Instructions (Signed)
Start back taking your fluid pill every other day and follow up with your md later this week.

## 2015-06-03 NOTE — ED Notes (Signed)
Nurse drawing labs. 

## 2015-06-03 NOTE — ED Provider Notes (Signed)
CSN: [id number]     Arrival date & time 06/03/15  1828 History   First MD Initiated Contact with Patient 06/03/15 1949     Chief Complaint  Patient presents with  . Shortness of Breath     (Consider location/radiation/quality/duration/timing/severity/associated sxs/prior Treatment) Patient is a 81 y.o. female presenting with shortness of breath. The history is provided by the patient (The patient complains of some shortness of breath when she lies down. Patient has had a history of swelling in her legs. She used to take Lasix but was stopped in August.).  Shortness of Breath Severity:  Moderate Onset quality:  Sudden Timing:  Intermittent Progression:  Improving Chronicity:  New Context: not animal exposure   Associated symptoms: no abdominal pain, no chest pain, no cough, no headaches and no rash     Past Medical History  Diagnosis Date  . Rheumatoid arthritis(714.0)   . Allergy   . Cancer ([place])   . Blood transfusion without reported diagnosis   . Anemia   . Cataract   . UTI (urinary tract infection)   . Melanoma [facility])    Past Surgical History  Procedure Laterality Date  . Joint replacement    . Mastectomy Left   . Hip replacement Bilateral   . Appendectomy    . Fracture surgery    . Breast surgery Left 2010    ? breast cancer   Family History  Problem Relation Age of Onset  . Emphysema Father     smoked and was exposed to mustard gas  . COPD Father   . Cancer Sister     esophageal  . Hypertension Daughter   . Hypertension Son   . Cancer Brother   . Cancer Sister    Social History  Substance Use Topics  . Smoking status: Never Smoker   . Smokeless tobacco: Never Used  . Alcohol Use: 0.6 oz/week    1 Glasses of wine per week     Comment: 1 glassred wine  at dinner each night   OB History    No data available     Review of Systems  Constitutional: Negative for appetite change and fatigue.  HENT: Negative for congestion, ear discharge and sinus  pressure.   Eyes: Negative for discharge.  Respiratory: Positive for shortness of breath. Negative for cough.   Cardiovascular: Negative for chest pain.  Gastrointestinal: Negative for abdominal pain and diarrhea.  Genitourinary: Negative for frequency and hematuria.  Musculoskeletal: Negative for back pain.  Skin: Negative for rash.  Neurological: Negative for seizures and headaches.  Psychiatric/Behavioral: Negative for hallucinations.      Allergies  Review of patient's allergies indicates no known allergies.  Home Medications   Prior to Admission medications   Medication Sig Start Date End Date Taking? Authorizing Provider  docusate sodium (COLACE) 100 MG capsule Take 100 mg by mouth daily.   Yes Historical Provider, MD  DULoxetine (CYMBALTA) 60 MG capsule Take 1 capsule (60 mg total) by mouth daily. 12/04/14  Yes Barton Fanny, MD  hydroxychloroquine (PLAQUENIL) 200 MG tablet TAKE 1 TABLET (200 MG TOTAL) BY MOUTH DAILY. 12/11/14  Yes Barton Fanny, MD  lidocaine-prilocaine (EMLA) cream Apply 1 application topically as directed. Apply to lesions undergoing injection 30 mins prior to treatment   Yes Historical Provider, MD  magnesium hydroxide (MILK OF MAGNESIA) 800 MG/5ML suspension Take 15 mLs by mouth daily as needed for constipation.   Yes Historical Provider, MD  Multiple Vitamin (MULTIVITAMIN) tablet Take 1  tablet by mouth daily.   Yes Historical Provider, MD  nystatin (MYCOSTATIN/NYSTOP) 100000 UNIT/GM POWD Twice daily as needed to affected area 05/21/15  Yes Estill Dooms, MD  oxyCODONE-acetaminophen (PERCOCET/ROXICET) 5-325 MG tablet Take 0.5-1 tablets by mouth every 6 (six) hours as needed. 05/21/15  Yes Estill Dooms, MD  prochlorperazine (COMPAZINE) 10 MG tablet Take 10 mg by mouth every 6 (six) hours as needed for nausea or vomiting.   Yes Historical Provider, MD  polyethylene glycol (MIRALAX / GLYCOLAX) packet Take 17 g by mouth daily as needed for mild  constipation or moderate constipation.     Historical Provider, MD  senna (SENOKOT) 8.6 MG tablet Take 2 tablets by mouth at bedtime as needed.     Historical Provider, MD  Tdap (BOOSTRIX) 5-2.5-18.5 LF-MCG/0.5 injection Inject 0.5 mLs into the muscle once. 04/11/15   [name], NP   BP 158/70 mmHg  Pulse 75  Temp(Src) 98.1 F (36.7 C) (Oral)  Resp 20  SpO2 98% Physical Exam  Constitutional: She is oriented to person, place, and time. She appears well-developed.  HENT:  Head: Normocephalic.  Eyes: Conjunctivae and EOM are normal. No scleral icterus.  Neck: Neck supple. No thyromegaly present.  Cardiovascular: Normal rate and regular rhythm.  Exam reveals no gallop and no friction rub.   No murmur heard. Pulmonary/Chest: No stridor. She has no wheezes. She has no rales. She exhibits no tenderness.  Abdominal: She exhibits no distension. There is no tenderness. There is no rebound.  Musculoskeletal: Normal range of motion. She exhibits edema.  3+ edema in both lower legs  Lymphadenopathy:    She has no cervical adenopathy.  Neurological: She is oriented to person, place, and time. She exhibits normal muscle tone. Coordination normal.  Skin: No rash noted. No erythema.  Psychiatric: She has a normal mood and affect. Her behavior is normal.    ED Course  Procedures (including critical care time) Labs Review Labs Reviewed  CBC - Abnormal; Notable for the following:    RBC 3.44 (*)    Hemoglobin 9.4 (*)    HCT 29.2 (*)    All other components within normal limits  BASIC METABOLIC PANEL - Abnormal; Notable for the following:    Glucose, Bld 113 (*)    BUN 23 (*)    Creatinine, Ser 1.03 (*)    GFR calc non Af Amer 46 (*)    GFR calc Af Amer 53 (*)    All other components within normal limits  HEPATIC FUNCTION PANEL - Abnormal; Notable for the following:    Bilirubin, Direct <0.1 (*)    All other components within normal limits  BRAIN NATRIURETIC PEPTIDE - Abnormal;  Notable for the following:    B Natriuretic Peptide 291.1 (*)    All other components within normal limits  CULTURE, BLOOD (ROUTINE X 2)  CULTURE, BLOOD (ROUTINE X 2)  I-STAT CG4 LACTIC ACID, ED  I-STAT TROPOININ, ED  I-STAT CG4 LACTIC ACID, ED    Imaging Review Dg Chest 2 View  06/03/2015  CLINICAL DATA:  Shortness of breath for 1 day. EXAM: CHEST - 2 VIEW COMPARISON:  06/13/2013 FINDINGS: The heart size and mediastinal contours are within normal limits. Lung volumes are low with bibasilar atelectasis present. The right hemidiaphragm is mildly elevated. There is no evidence of pulmonary edema, consolidation, pneumothorax, nodule or pleural fluid. Advanced degenerative changes are seen involving both shoulders. IMPRESSION: Low volumes with bibasilar atelectasis.  No active disease. Electronically Signed  By: Aletta Edouard M.D.   On: 06/03/2015 19:30   Ct Angio Chest Aorta W/cm &/or Wo/cm  06/03/2015  CLINICAL DATA:  81 year old who is currently undergoing treatment for a melanoma on her forehead, presenting with 2 day history of shortness of breath. EXAM: CT ANGIOGRAPHY CHEST WITHOUT AND WITH CONTRAST TECHNIQUE: Multidetector CT imaging of the chest was performed using the standard protocol before and during bolus administration of intravenous contrast. Multiplanar CT image reconstructions and MIPs were obtained to evaluate the vascular anatomy. CONTRAST:  74mL OMNIPAQUE IOHEXOL 350 MG/ML IV. COMPARISON:  CT chest 01/03/2015. FINDINGS: Unenhanced images demonstrate no evidence of mural hematoma involving the thoracic aorta. Mild mitral annular calcification is present. No visible coronary calcification. Enhanced images demonstrate no evidence of thoracic aortic aneurysm or dissection. Mild atherosclerosis is present involving the thoracic aorta and the proximal great vessels. Heart mildly enlarged.  No pericardial effusion. Biapical pleuroparenchymal scarring, right greater than left. Mild  atelectasis in the lower lobes related to low lung volumes. Lungs otherwise clear. No pulmonary parenchymal nodules to suggest metastatic disease. No confluent airspace consolidation. No pleural effusions. Central airways patent. Approximate 4.3 x 2.7 cm cystic mass involving the mediastinum adjacent to the main pulmonary trunk and the ascending thoracic aorta. Calcified lymph node adjacent to the esophagus in the superior mediastinum. No pathologic mediastinal, hilar or axillary lymphadenopathy. Esophagus normal in appearance. Colloid cyst arising from the lower pole of the otherwise normal-appearing thyroid gland. Visualized upper abdomen unremarkable for the early arterial phase of enhancement. Bone window images demonstrate osseous demineralization, exaggeration of the usual thoracic kyphosis, degenerative disc disease and spondylosis throughout the visualized cervical spine and to a lesser did carie throughout the thoracic spine. Review of the MIP images confirms the above findings. IMPRESSION: 1. No evidence of thoracic aortic dissection. 2. Mild cardiomegaly.  No visible coronary calcification. 3. Mild bilateral lower lobe atelectasis related to low lung volumes. No acute cardiopulmonary disease otherwise. 4. Approximate 4 cm cyst involving the mediastinum, likely a duplication cyst or pericardial cyst. 5. No pathologic lymphadenopathy. Electronically Signed   By: Evangeline Dakin M.D.   On: 06/03/2015 22:21   I have personally reviewed and evaluated these images and lab results as part of my medical decision-making.   EKG Interpretation   Date/Time:  Monday June 03 2015 19:31:26 EDT Ventricular Rate:  76 PR Interval:  187 QRS Duration: 145 QT Interval:  435 QTC Calculation: 489 R Axis:   15 Text Interpretation:  Sinus rhythm Right bundle branch block Baseline  wander in lead(s) V2 Confirmed by Delrae Hagey  MD, Aissa Lisowski [phone]) on 06/03/2015  9:19:54 PM      MDM   Final diagnoses:  Dyspnea       Labs unremarkable. Chest x-ray shows some atelectasis CT angiogram shows no PE or congestive heart failure. Patient's symptoms are improving she will be sent home with instructions to start back on her Lasix probably taken every other day and follow-up with her family doctor this week  Milton Ferguson, MD 06/03/15 2247

## 2015-06-08 LAB — CULTURE, BLOOD (ROUTINE X 2)
Culture: NO GROWTH
Culture: NO GROWTH

## 2015-06-19 ENCOUNTER — Other Ambulatory Visit: Payer: Self-pay | Admitting: Internal Medicine

## 2015-06-19 MED ORDER — OXYCODONE-ACETAMINOPHEN 5-325 MG PO TABS: 0.5000 | ORAL_TABLET | Freq: Four times a day (QID) | ORAL | Status: DC | PRN

## 2015-06-19 NOTE — Telephone Encounter (Signed)
Daughter, [name] requested and will be picking up.

## 2015-07-02 ENCOUNTER — Other Ambulatory Visit: Payer: Self-pay | Admitting: Nurse Practitioner

## 2015-07-04 ENCOUNTER — Ambulatory Visit: Payer: PPO | Admitting: Podiatry

## 2015-07-10 ENCOUNTER — Encounter: Payer: Self-pay | Admitting: Podiatry

## 2015-07-10 ENCOUNTER — Ambulatory Visit (INDEPENDENT_AMBULATORY_CARE_PROVIDER_SITE_OTHER): Payer: PPO | Admitting: Podiatry

## 2015-07-10 DIAGNOSIS — L84 Corns and callosities: Secondary | ICD-10-CM

## 2015-07-10 DIAGNOSIS — B351 Tinea unguium: Secondary | ICD-10-CM | POA: Diagnosis not present

## 2015-07-10 DIAGNOSIS — G629 Polyneuropathy, unspecified: Secondary | ICD-10-CM

## 2015-07-10 DIAGNOSIS — M79609 Pain in unspecified limb: Principal | ICD-10-CM

## 2015-07-10 NOTE — Progress Notes (Signed)
   Subjective:    Patient ID: Patty Walters, female    DOB: 06-20-1922, 81 y.o.   MRN: [id number]  HPI  PT REQUESTING FOR TOENAILS DEBRIDEMENT. This patient presents to the office for nail care for both feet. She says her nails are painful walking and wearing her shoes.  She has significant swelling both feet. She has developed painful areas on the tip of her fourth toe left and third toe right.  She presents for preventive foot care services. Review of Systems  HENT: Positive for hearing loss.   Cardiovascular: Positive for leg swelling.  Musculoskeletal: Positive for joint swelling and gait problem.       Objective:   Physical Exam GENERAL APPEARANCE: Alert, conversant. Appropriately groomed. No acute distress.  VASCULAR: Pedal pulses not  palpable at  [facility] and PT bilateral due to severity of swelling both feet.  Capillary refill time is immediate to all digits,  Cold feet noted.  NEUROLOGIC: sensation is normal to 5.07 monofilament at 5/5 sites bilateral.  Light touch is intact bilateral, Muscle strength normal.  MUSCULOSKELETAL: acceptable muscle strength, tone and stability bilateral.  Intrinsic muscluature intact bilateral.  Rectus appearance of foot and digits noted bilateral.   DERMATOLOGIC: skin color, texture, and turgor are within normal limits.  No preulcerative lesions or ulcers  are seen, no interdigital maceration noted.  No open lesions present.   No drainage noted. Distal clavi noted fourth toe left foot and third toe right foot. NAILS  Thick disfigured discolored nails both feet.        Assessment & Plan:  Onychomycosis  B/L  Distal clavi B/L IE  Debridement of nails    Debridement of clavi B/L.  Toe cap noted.  Gardiner Barefoot DPM

## 2015-07-11 ENCOUNTER — Ambulatory Visit: Payer: PPO | Admitting: Nurse Practitioner

## 2015-07-16 ENCOUNTER — Ambulatory Visit: Payer: PPO | Admitting: Nurse Practitioner

## 2015-07-23 ENCOUNTER — Ambulatory Visit (INDEPENDENT_AMBULATORY_CARE_PROVIDER_SITE_OTHER): Payer: PPO | Admitting: Nurse Practitioner

## 2015-07-23 ENCOUNTER — Encounter: Payer: Self-pay | Admitting: Nurse Practitioner

## 2015-07-23 VITALS — BP 104/58 | HR 64 | Temp 97.7°F | Resp 12 | Ht 65.0 in | Wt 143.4 lb

## 2015-07-23 DIAGNOSIS — L8991 Pressure ulcer of unspecified site, stage 1: Secondary | ICD-10-CM

## 2015-07-23 DIAGNOSIS — I499 Cardiac arrhythmia, unspecified: Secondary | ICD-10-CM

## 2015-07-23 DIAGNOSIS — C439 Malignant melanoma of skin, unspecified: Secondary | ICD-10-CM | POA: Diagnosis not present

## 2015-07-23 DIAGNOSIS — R600 Localized edema: Secondary | ICD-10-CM | POA: Diagnosis not present

## 2015-07-23 DIAGNOSIS — D649 Anemia, unspecified: Secondary | ICD-10-CM | POA: Diagnosis not present

## 2015-07-23 NOTE — Patient Instructions (Signed)
Follow up in 3 months with Dr Mariea Clonts

## 2015-07-23 NOTE — Progress Notes (Signed)
Patient ID: Patty Walters, female   DOB: 1922-04-24, 81 y.o.   MRN: [id number]    PCP: [name], NP  Advanced Directive information Does patient have an advance directive?: Yes, Type of Advance Directive: [place];Living will, Does patient want to make changes to advanced directive?: No - Patient declined  No Known Allergies  Chief Complaint  Patient presents with  . Medical Management of Chronic Issues    3 month follow-up, examine right eye, examine possible bed sore on right side. Here with her daughter [name]  . acute    Low B/P today      HPI: Patient is a 81 y.o. female seen in the office today for routine follow up. Pt with hx of anemia of chronic disease, RA, depression, edema, constipation, bilateral hip replacement (after failure from hip repair), melanoma followed by oncology at [place]. Currently getting [facility] treatment which has been very successful to reducing the size of lymphnodes. Has had 5/6 treatments. Minimal size effects of medications.  Had 1 episodes of shortness of breath which contributed to treatment.   Eating well. Moving bowels good. Feels well other than easily fatigue.  No shortness of breath or chest pains, no palpitations.   Review of Systems:  Review of Systems  Constitutional: Negative for chills, diaphoresis, activity change, appetite change, fatigue and unexpected weight change.  HENT: Negative for congestion and hearing loss.   Eyes: Negative.        Will get recurrent blepharitis, has dry eyes  Respiratory: Negative for cough and shortness of breath.   Cardiovascular: Negative for chest pain, palpitations and leg swelling.  Gastrointestinal: Negative for abdominal pain, diarrhea and constipation.  Genitourinary: Negative for dysuria and difficulty urinating.  Musculoskeletal: Negative for myalgias and arthralgias.  Skin: Positive for color change (redness to below left buttocks). Negative for wound.       Hx  of melanoma, skin discoloration on her face. Cancer improved, follow up every 2 weeks  Neurological: Negative for dizziness and weakness.  Psychiatric/Behavioral: Negative for behavioral problems, confusion, sleep disturbance and agitation.    Past Medical History  Diagnosis Date  . Rheumatoid arthritis(714.0)   . Allergy   . Cancer ([place])   . Blood transfusion without reported diagnosis   . Anemia   . Cataract   . UTI (urinary tract infection)   . Melanoma [facility])    Past Surgical History  Procedure Laterality Date  . Joint replacement    . Mastectomy Left   . Hip replacement Bilateral   . Appendectomy    . Fracture surgery    . Breast surgery Left 2010    ? breast cancer   Social History:   reports that she has never smoked. She has never used smokeless tobacco. She reports that she drinks about 0.6 oz of alcohol per week. She reports that she does not use illicit drugs.  Family History  Problem Relation Age of Onset  . Emphysema Father     smoked and was exposed to mustard gas  . COPD Father   . Cancer Sister     esophageal  . Hypertension Daughter   . Hypertension Son   . Cancer Brother   . Cancer Sister     Medications: Patient's Medications  New Prescriptions   No medications on file  Previous Medications   DOCUSATE SODIUM (COLACE) 100 MG CAPSULE    Take 100 mg by mouth daily.   DULOXETINE (CYMBALTA) 60 MG CAPSULE  TAKE 1 CAPSULE (60 MG TOTAL) BY MOUTH DAILY.   HYDROXYCHLOROQUINE (PLAQUENIL) 200 MG TABLET    TAKE 1 TABLET (200 MG TOTAL) BY MOUTH DAILY.   LIDOCAINE-PRILOCAINE (EMLA) CREAM    Apply 1 application topically as directed. Apply to lesions undergoing injection 30 mins prior to treatment   MAGNESIUM HYDROXIDE (MILK OF MAGNESIA) 800 MG/5ML SUSPENSION    Take 15 mLs by mouth daily as needed for constipation.   MULTIPLE VITAMIN (MULTIVITAMIN) TABLET    Take 1 tablet by mouth daily.   NYSTATIN (MYCOSTATIN/NYSTOP) 100000 UNIT/GM POWD    Twice daily as  needed to affected area   OXYCODONE-ACETAMINOPHEN (PERCOCET/ROXICET) 5-325 MG TABLET    Take 0.5-1 tablets by mouth every 6 (six) hours as needed.   POLYETHYLENE GLYCOL (MIRALAX / GLYCOLAX) PACKET    Take 17 g by mouth daily as needed for mild constipation or moderate constipation.    PROCHLORPERAZINE (COMPAZINE) 10 MG TABLET    Take 10 mg by mouth every 6 (six) hours as needed for nausea or vomiting.   SENNA (SENOKOT) 8.6 MG TABLET    Take 2 tablets by mouth at bedtime as needed.    SKIN PROTECTANTS, MISC. (CALAZIME SKIN PROTECTANT EX)    Apply topically daily. After shower to prevent bed sores   TALIMOGENE LAHERPAREPVEC (IMLYGIC) 1000000 UNIT/ML SUSP    by Intralesional route. 1 treatment every 2 week at [facility]   TDAP (BOOSTRIX) 5-2.5-18.5 LF-MCG/0.5 INJECTION    Inject 0.5 mLs into the muscle once.  Modified Medications   No medications on file  Discontinued Medications   No medications on file     Physical Exam:  Filed Vitals:   07/23/15 1428  BP: 104/58  Pulse: 64  Temp: 97.7 F (36.5 C)  TempSrc: Oral  Resp: 12  Height: 5\' 5"  (1.651 m)  Weight: 143 lb 6.4 oz (65.046 kg)   Body mass index is 23.86 kg/(m^2).  Physical Exam  Constitutional: She is oriented to person, place, and time. She appears well-developed and well-nourished. No distress.  HENT:  Head: Normocephalic and atraumatic.  Mouth/Throat: Oropharynx is clear and moist. No oropharyngeal exudate.  Eyes: Conjunctivae are normal. Pupils are equal, round, and reactive to light.  Neck: Normal range of motion. Neck supple.  Cardiovascular: Normal rate, regular rhythm and normal heart sounds.   Occasional irregular beat  Pulmonary/Chest: Effort normal and breath sounds normal. No respiratory distress.  Abdominal: Soft. Bowel sounds are normal.  Musculoskeletal: She exhibits edema (2+ bilaterally). She exhibits no tenderness.  Lymphadenopathy:    She has cervical adenopathy.  Neurological: She is alert and oriented to  person, place, and time.  Skin: Skin is warm and dry. She is not diaphoretic.  Discoloration to forehead from skin removal  Psychiatric: She has a normal mood and affect.     Labs reviewed: Basic Metabolic Panel:  Recent Labs  12/11/14 1653 02/13/15 0500 06/03/15 1904  NA 139 141 141  K 4.5 4.6 4.0  CL 105 107 111  CO2 23 24 22   GLUCOSE 91 108* 113*  BUN 24* 31* 23*  CREATININE 0.95 1.15* 1.03*  CALCIUM 9.0 9.0 9.1   Liver Function Tests:  Recent Labs  12/11/14 1653 02/13/15 0500 06/03/15 1904  AST 21 20 30   ALT 15 15 23   ALKPHOS 65 79 65  BILITOT 0.3 0.6 0.4  PROT 7.0 7.4 7.5  ALBUMIN 3.8 3.8 3.8   No results for input(s): LIPASE, AMYLASE in the last 8760 hours. No results  for input(s): AMMONIA in the last 8760 hours. CBC:  Recent Labs  12/11/14 1653 02/13/15 0500 06/03/15 1904  WBC 6.2 9.0 7.8  NEUTROABS 4.5 6.9  --   HGB 10.7* 10.4* 9.4*  HCT 32.3* 31.8* 29.2*  MCV 84.3 87.1 84.9  PLT 308 258 272   Lipid Panel: No results for input(s): CHOL, HDL, LDLCALC, TRIG, CHOLHDL, LDLDIRECT in the last 8760 hours. TSH: No results for input(s): TSH in the last 8760 hours. A1C: No results found for: HGBA1C   Assessment/Plan 1. Anemia, unspecified anemia type -chronic anemia, noted drop in hgb from labs in ED, will follow up at this time - CBC with Differential  2. Melanoma of skin ([place]) -stable, ongoing follow up with [facility] oncologist for melanoma treatment.   3. Irregular heart beat -PACs noted on EKG, pt without palpitations or chest pains - EKG 12-Lead -will monitor  4. Localized edema -stable at this time, conts with compression hose  5. Pressure ulcer, stage 1 -redness noted below left buttock -pressure reduction to area -to use barrier cream    Follow up in 3 months with Dr Mariea Clonts.   Carlos American. Harle Battiest  [facility] [phone] 8 am - 5 pm) [phone] (after hours)

## 2015-07-24 LAB — CBC WITH DIFFERENTIAL/PLATELET
Basophils Absolute: 0.1 10*3/uL (ref 0.0–0.2)
Basos: 1 %
EOS (ABSOLUTE): 0.1 10*3/uL (ref 0.0–0.4)
EOS: 1 %
HEMATOCRIT: 30.5 % — AB (ref 34.0–46.6)
HEMOGLOBIN: 10.1 g/dL — AB (ref 11.1–15.9)
Immature Grans (Abs): 0 10*3/uL (ref 0.0–0.1)
Immature Granulocytes: 0 %
LYMPHS ABS: 1.5 10*3/uL (ref 0.7–3.1)
Lymphs: 23 %
MCH: 27.2 pg (ref 26.6–33.0)
MCHC: 33.1 g/dL (ref 31.5–35.7)
MCV: 82 fL (ref 79–97)
MONOCYTES: 8 %
Monocytes Absolute: 0.5 10*3/uL (ref 0.1–0.9)
Neutrophils Absolute: 4.4 10*3/uL (ref 1.4–7.0)
Neutrophils: 67 %
Platelets: 306 10*3/uL (ref 150–379)
RBC: 3.72 x10E6/uL — AB (ref 3.77–5.28)
RDW: 14.8 % (ref 12.3–15.4)
WBC: 6.5 10*3/uL (ref 3.4–10.8)

## 2015-07-25 IMAGING — CT CT ABD-PELV W/ CM
2 of 5 series · 16 of 46 positions shown, 18 images · IV contrast (80 ML OMNI 300)
Comparison: 01/03/2015

CLINICAL DATA: Patient fell onto the right side on 02/08/2015. Pain
since then with increased pain tonight.

EXAM:
CT ABDOMEN AND PELVIS WITH CONTRAST
TECHNIQUE: Multidetector CT imaging of the abdomen and pelvis was performed
using the standard protocol following bolus administration of
intravenous contrast.
CONTRAST:  80mL OMNIPAQUE IOHEXOL 300 MG/ML  SOLN

[Series 2: abd/pel with · axial · 0.74mm/px · z∈[+1070,+1435]mm · 13 of 81 slices shown, 15 images]
[im 4/81  soft-tissue]
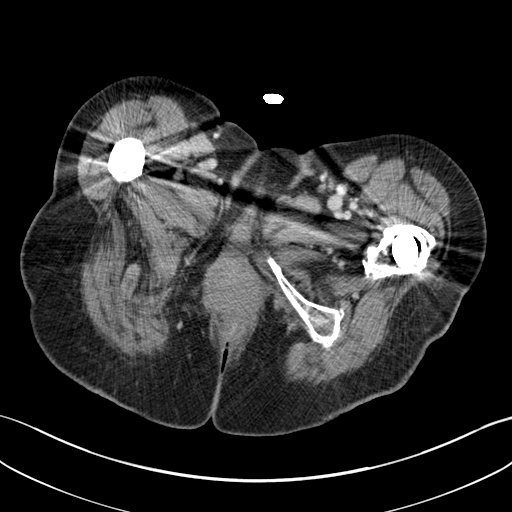
[im 4/81  bone]
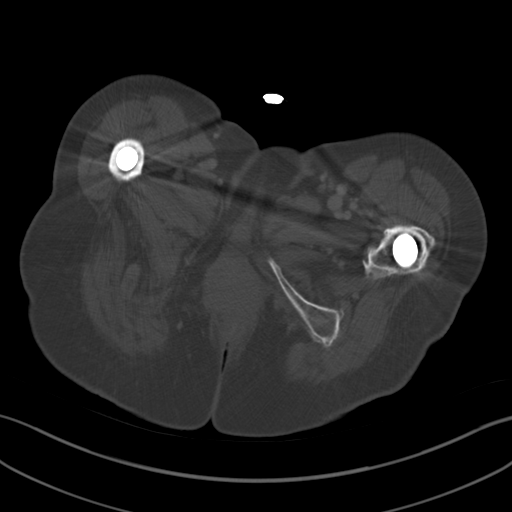
[im 11/81  soft-tissue]
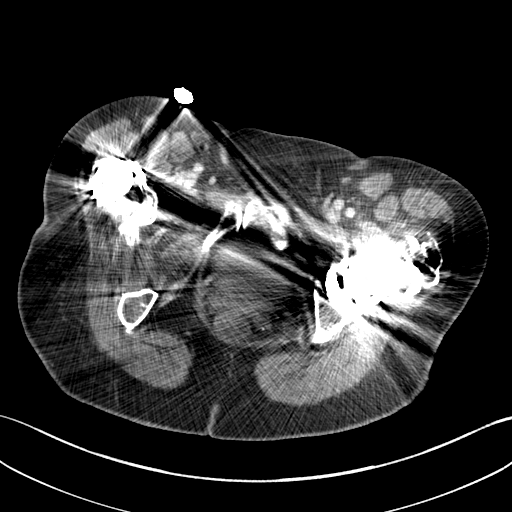
[im 19/81  soft-tissue]
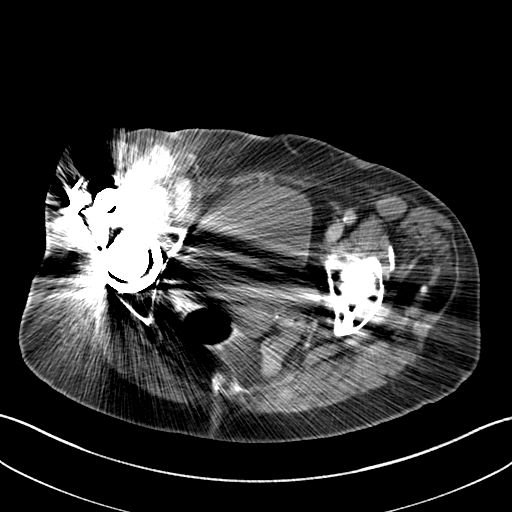
[im 22/81  soft-tissue]
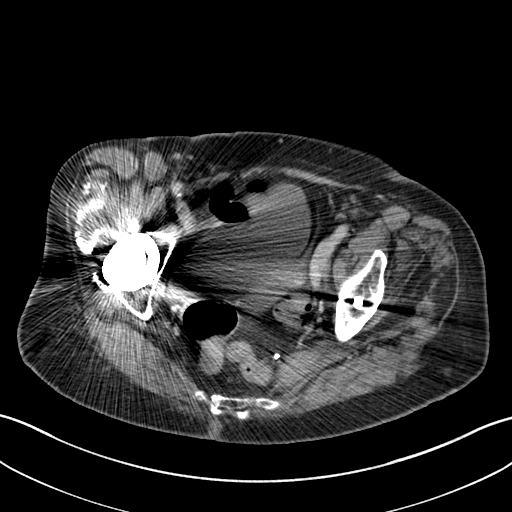
[im 30/81  soft-tissue]
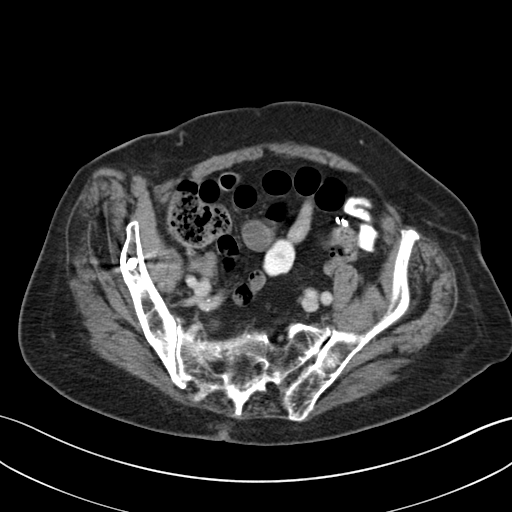
[im 33/81  soft-tissue]
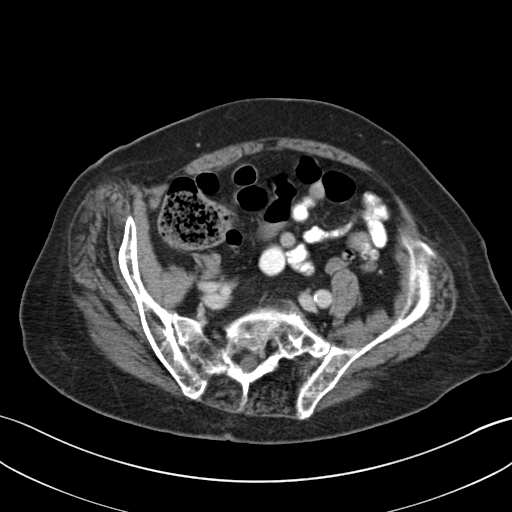
[im 41/81  soft-tissue]
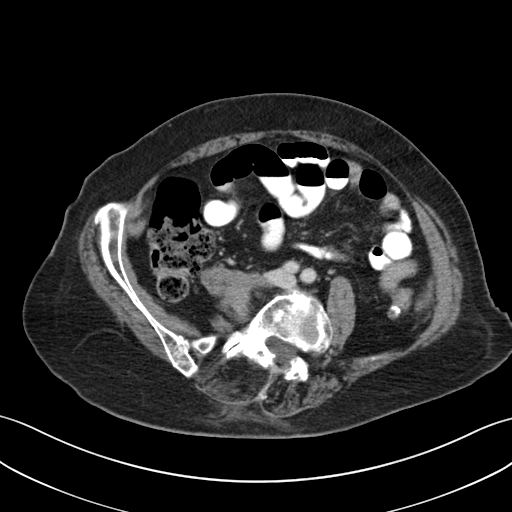
[im 48/81  soft-tissue]
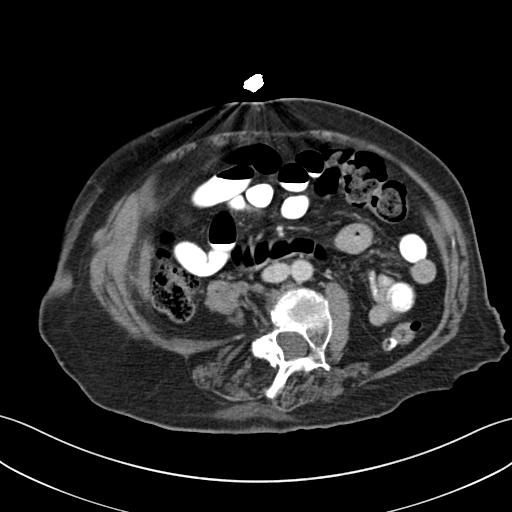
[im 51/81  soft-tissue]
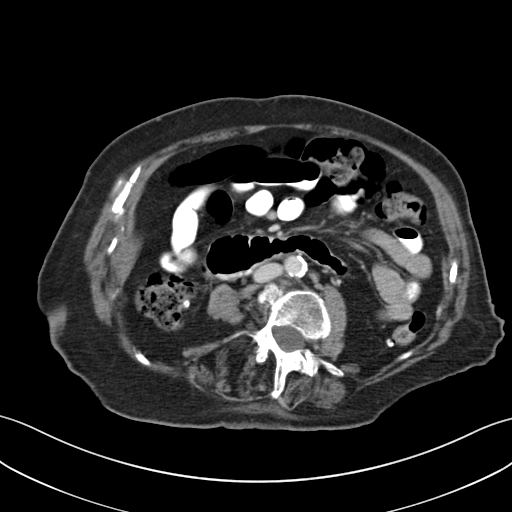
[im 51/81  bone]
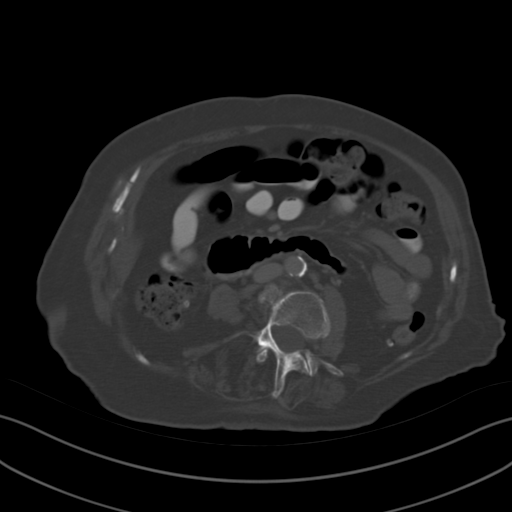
[im 59/81  soft-tissue]
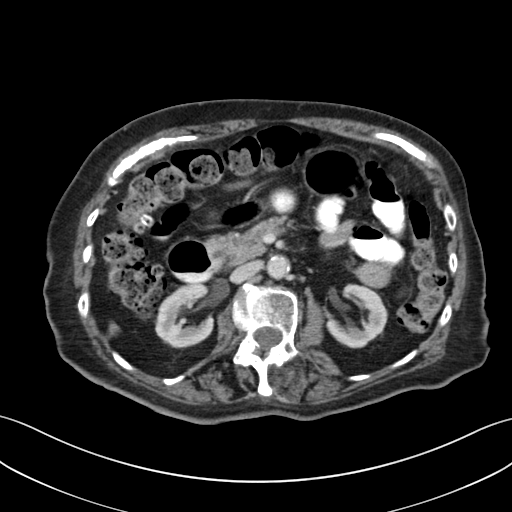
[im 62/81  soft-tissue]
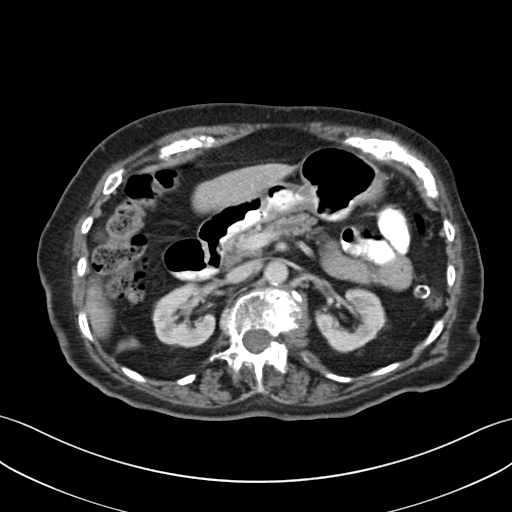
[im 70/81  soft-tissue]
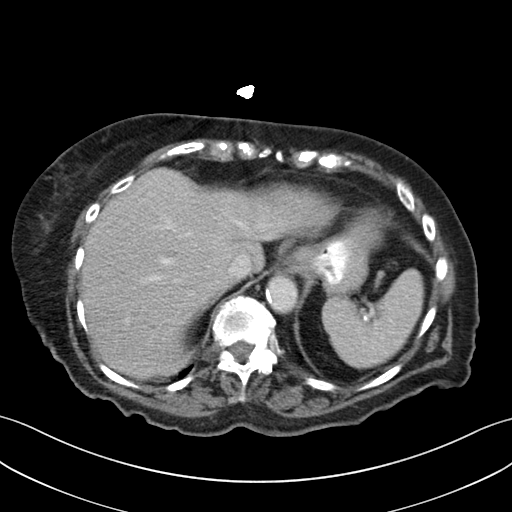
[im 77/81  soft-tissue]
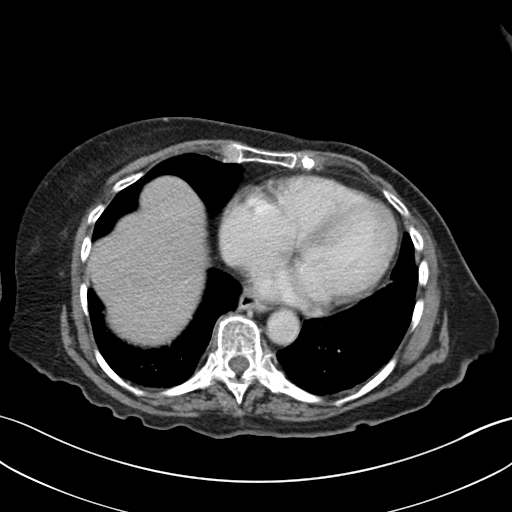

[Series 4: coronal a/|p · coronal · 0.63mm/px · 3 of 82 slices shown]
[im 28/82  soft-tissue]
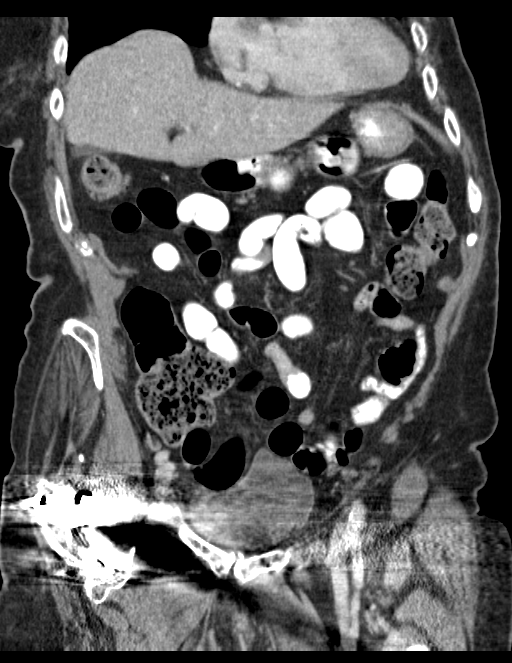
[im 37/82  soft-tissue]
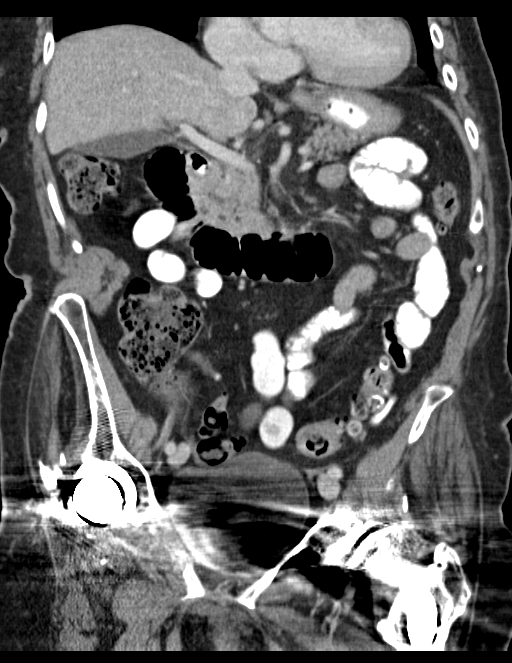
[im 46/82  soft-tissue]
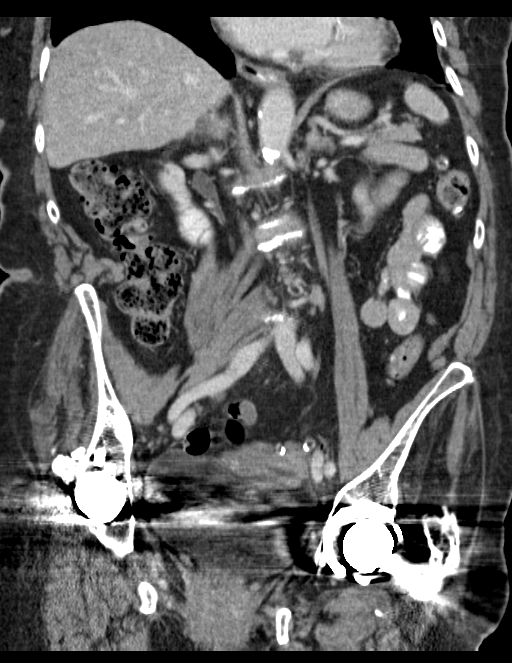

[16 of 46 positions shown; findings below may reference images not displayed]

FINDINGS: Dependent atelectasis in the lung bases. Mild cardiac enlargement.
Coronary artery calcifications.

Mild diffuse fatty infiltration of the liver. No focal liver
lesions. The gallbladder, pancreas, spleen, adrenal glands, inferior
vena cava, and retroperitoneal lymph nodes are unremarkable.
Subcentimeter cysts in the kidneys. No hydronephrosis. Calcification
of the aorta without aneurysm. Stomach, small bowel, and colon are
normal in caliber. No wall thickening is appreciated. No abnormal
mesenteric fluid collections. No free fluid or free air in the
abdomen. Abdominal wall musculature appears intact.

Pelvis: Bilateral hip arthroplasties. Streak artifact from the
hardware limits visualization of the lower pelvis. As visualize, the
pelvic organs appear normal in size. No abnormal pelvic mass or
lymphadenopathy. Diverticulosis of the sigmoid colon without
evidence of diverticulitis. The appendix is not identified.

Lumbar scoliosis convex towards the left. Degenerative changes in
the spine. Visualized ribs, sacrum, and pelvis appear intact. No
acute displaced fractures identified.
IMPRESSION: No acute posttraumatic changes demonstrated in the abdomen or
pelvis. No evidence of solid organ injury or bowel perforation.

## 2015-07-30 ENCOUNTER — Other Ambulatory Visit: Payer: Self-pay | Admitting: Internal Medicine

## 2015-07-30 MED ORDER — OXYCODONE-ACETAMINOPHEN 5-325 MG PO TABS: 0.5000 | ORAL_TABLET | Freq: Four times a day (QID) | ORAL | Status: DC | PRN

## 2015-08-07 DIAGNOSIS — E042 Nontoxic multinodular goiter: Secondary | ICD-10-CM | POA: Diagnosis not present

## 2015-08-07 DIAGNOSIS — C44309 Unspecified malignant neoplasm of skin of other parts of face: Secondary | ICD-10-CM | POA: Diagnosis not present

## 2015-08-07 DIAGNOSIS — Z79899 Other long term (current) drug therapy: Secondary | ICD-10-CM | POA: Diagnosis not present

## 2015-08-07 DIAGNOSIS — R590 Localized enlarged lymph nodes: Secondary | ICD-10-CM | POA: Diagnosis not present

## 2015-08-07 DIAGNOSIS — C439 Malignant melanoma of skin, unspecified: Secondary | ICD-10-CM | POA: Diagnosis not present

## 2015-08-07 DIAGNOSIS — D649 Anemia, unspecified: Secondary | ICD-10-CM | POA: Diagnosis not present

## 2015-08-07 DIAGNOSIS — I517 Cardiomegaly: Secondary | ICD-10-CM | POA: Diagnosis not present

## 2015-08-07 DIAGNOSIS — Z5111 Encounter for antineoplastic chemotherapy: Secondary | ICD-10-CM | POA: Diagnosis not present

## 2015-08-07 DIAGNOSIS — M069 Rheumatoid arthritis, unspecified: Secondary | ICD-10-CM | POA: Diagnosis not present

## 2015-08-07 DIAGNOSIS — K5900 Constipation, unspecified: Secondary | ICD-10-CM | POA: Diagnosis not present

## 2015-08-07 DIAGNOSIS — C799 Secondary malignant neoplasm of unspecified site: Secondary | ICD-10-CM | POA: Diagnosis not present

## 2015-08-07 DIAGNOSIS — L989 Disorder of the skin and subcutaneous tissue, unspecified: Secondary | ICD-10-CM | POA: Diagnosis not present

## 2015-08-29 DIAGNOSIS — M069 Rheumatoid arthritis, unspecified: Secondary | ICD-10-CM | POA: Diagnosis not present

## 2015-08-29 DIAGNOSIS — K5900 Constipation, unspecified: Secondary | ICD-10-CM | POA: Diagnosis not present

## 2015-08-29 DIAGNOSIS — D630 Anemia in neoplastic disease: Secondary | ICD-10-CM | POA: Diagnosis not present

## 2015-08-29 DIAGNOSIS — C779 Secondary and unspecified malignant neoplasm of lymph node, unspecified: Secondary | ICD-10-CM | POA: Diagnosis not present

## 2015-08-29 DIAGNOSIS — I517 Cardiomegaly: Secondary | ICD-10-CM | POA: Diagnosis not present

## 2015-08-29 DIAGNOSIS — C4330 Malignant melanoma of unspecified part of face: Secondary | ICD-10-CM | POA: Diagnosis not present

## 2015-08-29 DIAGNOSIS — R220 Localized swelling, mass and lump, head: Secondary | ICD-10-CM | POA: Diagnosis not present

## 2015-08-29 DIAGNOSIS — Z808 Family history of malignant neoplasm of other organs or systems: Secondary | ICD-10-CM | POA: Diagnosis not present

## 2015-08-29 DIAGNOSIS — Z79899 Other long term (current) drug therapy: Secondary | ICD-10-CM | POA: Diagnosis not present

## 2015-08-29 DIAGNOSIS — C439 Malignant melanoma of skin, unspecified: Secondary | ICD-10-CM | POA: Diagnosis not present

## 2015-08-29 DIAGNOSIS — E042 Nontoxic multinodular goiter: Secondary | ICD-10-CM | POA: Diagnosis not present

## 2015-09-02 ENCOUNTER — Other Ambulatory Visit: Payer: Self-pay | Admitting: Nurse Practitioner

## 2015-09-02 ENCOUNTER — Other Ambulatory Visit: Payer: Self-pay | Admitting: Internal Medicine

## 2015-09-02 DIAGNOSIS — B372 Candidiasis of skin and nail: Secondary | ICD-10-CM

## 2015-09-03 ENCOUNTER — Other Ambulatory Visit: Payer: Self-pay | Admitting: *Deleted

## 2015-09-03 DIAGNOSIS — B372 Candidiasis of skin and nail: Secondary | ICD-10-CM

## 2015-09-03 MED ORDER — OXYCODONE-ACETAMINOPHEN 5-325 MG PO TABS: 0.5000 | ORAL_TABLET | Freq: Four times a day (QID) | ORAL | Status: DC | PRN

## 2015-09-03 MED ORDER — NYSTATIN 100000 UNIT/GM EX POWD: CUTANEOUS | Status: DC

## 2015-09-03 MED ORDER — NYSTATIN 100000 UNIT/GM EX POWD: CUTANEOUS | Status: AC

## 2015-09-03 NOTE — Telephone Encounter (Signed)
Patient requested and will pick up 

## 2015-09-03 NOTE — Telephone Encounter (Signed)
Left message on daughter([name]) phone that her medication (nystatin) could be picked up at the pharmacy and that her oxycodone would have to picked up her at the office.

## 2015-10-07 ENCOUNTER — Other Ambulatory Visit: Payer: Self-pay | Admitting: Nurse Practitioner

## 2015-10-07 MED ORDER — OXYCODONE-ACETAMINOPHEN 5-325 MG PO TABS: 0.5000 | ORAL_TABLET | Freq: Four times a day (QID) | ORAL | Status: DC | PRN

## 2015-10-08 ENCOUNTER — Other Ambulatory Visit: Payer: Self-pay | Admitting: *Deleted

## 2015-10-08 MED ORDER — OXYCODONE-ACETAMINOPHEN 5-325 MG PO TABS: 0.5000 | ORAL_TABLET | Freq: Four times a day (QID) | ORAL | Status: DC | PRN

## 2015-10-08 NOTE — Telephone Encounter (Signed)
Patients caregiver showed up to the office to pick up Rx.

## 2015-10-09 ENCOUNTER — Encounter: Payer: Self-pay | Admitting: Podiatry

## 2015-10-09 ENCOUNTER — Ambulatory Visit (INDEPENDENT_AMBULATORY_CARE_PROVIDER_SITE_OTHER): Payer: PPO | Admitting: Podiatry

## 2015-10-09 DIAGNOSIS — M79609 Pain in unspecified limb: Principal | ICD-10-CM

## 2015-10-09 DIAGNOSIS — G629 Polyneuropathy, unspecified: Secondary | ICD-10-CM

## 2015-10-09 DIAGNOSIS — B351 Tinea unguium: Secondary | ICD-10-CM | POA: Diagnosis not present

## 2015-10-09 DIAGNOSIS — M79673 Pain in unspecified foot: Secondary | ICD-10-CM | POA: Diagnosis not present

## 2015-10-09 DIAGNOSIS — L84 Corns and callosities: Secondary | ICD-10-CM

## 2015-10-09 NOTE — Progress Notes (Signed)
   Subjective:    Patient ID: Patty Walters, female    DOB: Mar 03, 1922, 82 y.o.   MRN: [id number]  HPI  PT REQUESTING FOR TOENAILS DEBRIDEMENT. This patient presents to the office for nail care for both feet. She says her nails are painful walking and wearing her shoes.  She has significant swelling both feet. She has developed painful areas on the tip of her fourth toe left and third toe right.  She presents for preventive foot care services. Review of Systems  HENT: Positive for hearing loss.   Cardiovascular: Positive for leg swelling.  Musculoskeletal: Positive for joint swelling and gait problem.       Objective:   Physical Exam GENERAL APPEARANCE: Alert, conversant. Appropriately groomed. No acute distress.  VASCULAR: Pedal pulses not  palpable at  [facility] and PT bilateral due to severity of swelling both feet.  Capillary refill time is immediate to all digits,  Cold feet noted.  NEUROLOGIC: sensation is normal to 5.07 monofilament at 5/5 sites bilateral.  Light touch is intact bilateral, Muscle strength normal.  MUSCULOSKELETAL: acceptable muscle strength, tone and stability bilateral.  Intrinsic muscluature intact bilateral.  Rectus appearance of foot and digits noted bilateral.   DERMATOLOGIC: skin color, texture, and turgor are within normal limits.  No preulcerative lesions or ulcers  are seen, no interdigital maceration noted.  No open lesions present.   No drainage noted. Distal clavi noted fourth toe left foot and third toe right foot. NAILS  Thick disfigured discolored nails both feet.        Assessment & Plan:  Onychomycosis  B/L  Distal clavi B/L IE  Debridement of nails    Debridement of clavi B/L.  Toe cap noted.  Gardiner Barefoot DPM

## 2015-10-10 DIAGNOSIS — C439 Malignant melanoma of skin, unspecified: Secondary | ICD-10-CM | POA: Diagnosis not present

## 2015-10-10 DIAGNOSIS — M069 Rheumatoid arthritis, unspecified: Secondary | ICD-10-CM | POA: Diagnosis not present

## 2015-10-31 DIAGNOSIS — J9859 Other diseases of mediastinum, not elsewhere classified: Secondary | ICD-10-CM | POA: Diagnosis not present

## 2015-10-31 DIAGNOSIS — D649 Anemia, unspecified: Secondary | ICD-10-CM | POA: Diagnosis not present

## 2015-10-31 DIAGNOSIS — R609 Edema, unspecified: Secondary | ICD-10-CM | POA: Diagnosis not present

## 2015-10-31 DIAGNOSIS — C439 Malignant melanoma of skin, unspecified: Secondary | ICD-10-CM | POA: Diagnosis not present

## 2015-10-31 DIAGNOSIS — Z5111 Encounter for antineoplastic chemotherapy: Secondary | ICD-10-CM | POA: Diagnosis not present

## 2015-10-31 DIAGNOSIS — Z79899 Other long term (current) drug therapy: Secondary | ICD-10-CM | POA: Diagnosis not present

## 2015-10-31 DIAGNOSIS — I517 Cardiomegaly: Secondary | ICD-10-CM | POA: Diagnosis not present

## 2015-10-31 DIAGNOSIS — Z808 Family history of malignant neoplasm of other organs or systems: Secondary | ICD-10-CM | POA: Diagnosis not present

## 2015-10-31 DIAGNOSIS — L539 Erythematous condition, unspecified: Secondary | ICD-10-CM | POA: Diagnosis not present

## 2015-10-31 DIAGNOSIS — J9811 Atelectasis: Secondary | ICD-10-CM | POA: Diagnosis not present

## 2015-10-31 DIAGNOSIS — Z9181 History of falling: Secondary | ICD-10-CM | POA: Diagnosis not present

## 2015-10-31 DIAGNOSIS — K5900 Constipation, unspecified: Secondary | ICD-10-CM | POA: Diagnosis not present

## 2015-10-31 DIAGNOSIS — M069 Rheumatoid arthritis, unspecified: Secondary | ICD-10-CM | POA: Diagnosis not present

## 2015-10-31 DIAGNOSIS — C4330 Malignant melanoma of unspecified part of face: Secondary | ICD-10-CM | POA: Diagnosis not present

## 2015-11-10 ENCOUNTER — Other Ambulatory Visit: Payer: Self-pay | Admitting: Nurse Practitioner

## 2015-11-11 ENCOUNTER — Other Ambulatory Visit: Payer: Self-pay

## 2015-11-11 MED ORDER — OXYCODONE-ACETAMINOPHEN 5-325 MG PO TABS: 0.5000 | ORAL_TABLET | Freq: Four times a day (QID) | ORAL | Status: DC | PRN

## 2015-11-14 ENCOUNTER — Telehealth: Payer: Self-pay | Admitting: *Deleted

## 2015-11-14 NOTE — Telephone Encounter (Signed)
Daughter Benjamine Mola Corvinns requesting Handicap placard to be filled out for patient. To call her when ready for pick up [phone] Filled out and given to Dr. Mariea Clonts to review and sign.

## 2015-11-21 DIAGNOSIS — Z79899 Other long term (current) drug therapy: Secondary | ICD-10-CM | POA: Diagnosis not present

## 2015-11-21 DIAGNOSIS — C439 Malignant melanoma of skin, unspecified: Secondary | ICD-10-CM | POA: Diagnosis not present

## 2015-11-21 DIAGNOSIS — M069 Rheumatoid arthritis, unspecified: Secondary | ICD-10-CM | POA: Diagnosis not present

## 2015-11-28 ENCOUNTER — Ambulatory Visit: Payer: PPO | Admitting: Internal Medicine

## 2015-12-05 DIAGNOSIS — C779 Secondary and unspecified malignant neoplasm of lymph node, unspecified: Secondary | ICD-10-CM | POA: Diagnosis not present

## 2015-12-05 DIAGNOSIS — L989 Disorder of the skin and subcutaneous tissue, unspecified: Secondary | ICD-10-CM | POA: Diagnosis not present

## 2015-12-05 DIAGNOSIS — Z79899 Other long term (current) drug therapy: Secondary | ICD-10-CM | POA: Diagnosis not present

## 2015-12-05 DIAGNOSIS — Z5111 Encounter for antineoplastic chemotherapy: Secondary | ICD-10-CM | POA: Diagnosis not present

## 2015-12-05 DIAGNOSIS — C439 Malignant melanoma of skin, unspecified: Secondary | ICD-10-CM | POA: Diagnosis not present

## 2015-12-11 ENCOUNTER — Ambulatory Visit: Payer: PPO | Admitting: Podiatry

## 2015-12-16 ENCOUNTER — Ambulatory Visit
Admission: RE | Admit: 2015-12-16 | Discharge: 2015-12-16 | Disposition: A | Discharge status: Home / Self Care | Payer: PPO | Source: Ambulatory Visit | Attending: Internal Medicine | Admitting: Internal Medicine

## 2015-12-16 ENCOUNTER — Encounter: Payer: Self-pay | Admitting: Internal Medicine

## 2015-12-16 ENCOUNTER — Ambulatory Visit (INDEPENDENT_AMBULATORY_CARE_PROVIDER_SITE_OTHER): Payer: PPO | Admitting: Internal Medicine

## 2015-12-16 VITALS — BP 138/60 | HR 86 | Temp 97.9°F | Ht 65.0 in | Wt 142.0 lb

## 2015-12-16 DIAGNOSIS — C439 Malignant melanoma of skin, unspecified: Secondary | ICD-10-CM | POA: Diagnosis not present

## 2015-12-16 DIAGNOSIS — M79601 Pain in right arm: Secondary | ICD-10-CM

## 2015-12-16 DIAGNOSIS — M19011 Primary osteoarthritis, right shoulder: Secondary | ICD-10-CM | POA: Diagnosis not present

## 2015-12-16 DIAGNOSIS — H6123 Impacted cerumen, bilateral: Secondary | ICD-10-CM

## 2015-12-16 DIAGNOSIS — R600 Localized edema: Secondary | ICD-10-CM

## 2015-12-16 DIAGNOSIS — M25521 Pain in right elbow: Secondary | ICD-10-CM | POA: Diagnosis not present

## 2015-12-16 DIAGNOSIS — T148XXA Other injury of unspecified body region, initial encounter: Secondary | ICD-10-CM

## 2015-12-16 DIAGNOSIS — T148 Other injury of unspecified body region: Secondary | ICD-10-CM

## 2015-12-16 MED ORDER — FUROSEMIDE 20 MG PO TABS: 20.0000 mg | ORAL_TABLET | Freq: Every day | ORAL | Status: DC | PRN

## 2015-12-16 NOTE — Progress Notes (Signed)
Location:  [facility] clinic Provider:  Mireille Lacombe L. Mariea Clonts, D.O., C.M.D.  Code Status: DNR Goals of Care:  Advanced Directives 12/16/2015  Does patient have an advance directive? Yes  Type of Paramedic of [place];Out of facility DNR (pink MOST or yellow form)  Does patient want to make changes to advanced directive? -  Copy of advanced directive(s) in chart? Yes  Pre-existing out of facility DNR order (yellow form or pink MOST form) Yellow form placed in chart (order not valid for inpatient use);Pink MOST form placed in chart (order not valid for inpatient use)     Chief Complaint  Patient presents with  . Medical Management of Chronic Issues    3 mth follow-up, daughter [name]    HPI: Patient is a 82 y.o. female seen today for medical management of chronic diseases.    She sees Coral Ceo nurse navigator at [facility] cancer care for her melanoma.  Finished her round of injections 1.5wks ago (new med) and does not need to go back for three months.  She had had lymphadenopathy in her neck and perauricular areas.  They've requested cbc, cmp.  She went to visit her younger sister who turned 63 and she's had short term memory loss.    At one time, she'd taken generic lasix for edema. Was stopped a while back.  She is sitting more then.  Worse in right than left foot.  Now has electric chair that can elevate feet so that should help.  She can move more with that.  Sits a lot.  They are asking about the lasix--risks reviewed of dehydration, hypokalemia, incontinence spells.    Was having difficulty with earwax that staff could not get out before.  Tried the otc drops and never reassessed.    Right arm hurts her.  Already discussed with oncology, but they did not think it was related to her cancer.  Is righthanded and uses her walker.  Had a fall last year in the bathroom, she forgot about her life alert, was sitting on bathroom floor when her daughter got home and she told EMS  she was fine.  Now her daughter wonders if she fractured or injured her arm.  She did fall against that arm in the bathroom.  Reports it hurts to bear weight on that arm when using her walker.  There is a lot of crepitus in that elbow.  Pain is mostly in her upper arm.    Past Medical History  Diagnosis Date  . Rheumatoid arthritis(714.0)   . Allergy   . Cancer ([place])   . Blood transfusion without reported diagnosis   . Anemia   . Cataract   . UTI (urinary tract infection)   . Melanoma [facility])     Past Surgical History  Procedure Laterality Date  . Joint replacement    . Mastectomy Left   . Hip replacement Bilateral   . Appendectomy    . Fracture surgery    . Breast surgery Left 2010    ? breast cancer    No Known Allergies    Medication List       This list is accurate as of: 12/16/15  1:35 PM.  Always use your most recent med list.               CALAZIME SKIN PROTECTANT EX  Apply topically daily. After shower to prevent bed sores     docusate sodium 100 MG capsule  Commonly known as:  COLACE  Take 100 mg by mouth daily.     DULoxetine 60 MG capsule  Commonly known as:  CYMBALTA  TAKE 1 CAPSULE (60 MG TOTAL) BY MOUTH DAILY.     hydroxychloroquine 200 MG tablet  Commonly known as:  PLAQUENIL  TAKE 1 TABLET (200 MG TOTAL) BY MOUTH DAILY.     IMLYGIC 1000000 UNIT/ML Susp  Generic drug:  Talimogene Laherparepvec  by Intralesional route. 1 treatment every 2 week at [facility]     lidocaine-prilocaine cream  Commonly known as:  EMLA  Apply 1 application topically as directed. Apply to lesions undergoing injection 30 mins prior to treatment     magnesium hydroxide 800 MG/5ML suspension  Commonly known as:  MILK OF MAGNESIA  Take 15 mLs by mouth daily as needed for constipation.     multivitamin tablet  Take 1 tablet by mouth daily.     nystatin powder  Generic drug:  nystatin  Twice daily as needed to affected area     oxyCODONE-acetaminophen 5-325 MG tablet    Commonly known as:  PERCOCET/ROXICET  Take 0.5-1 tablets by mouth every 6 (six) hours as needed.     polyethylene glycol packet  Commonly known as:  MIRALAX / GLYCOLAX  Take 17 g by mouth daily as needed for mild constipation or moderate constipation.     prochlorperazine 10 MG tablet  Commonly known as:  COMPAZINE  Take 10 mg by mouth every 6 (six) hours as needed for nausea or vomiting.     senna 8.6 MG tablet  Commonly known as:  SENOKOT  Take 2 tablets by mouth at bedtime as needed.        Review of Systems:  Review of Systems  Constitutional: Negative for fever and chills.  HENT: Positive for hearing loss. Negative for congestion.   Eyes: Negative for pain.  Respiratory: Negative for cough, shortness of breath and wheezing.   Cardiovascular: Positive for leg swelling. Negative for chest pain, palpitations, orthopnea and PND.  Gastrointestinal: Negative for abdominal pain, constipation, blood in stool and melena.  Genitourinary: Negative for dysuria.  Musculoskeletal: Negative for falls.  Skin:       Only small visible area of melanoma remains on right temple at present  Neurological: Positive for weakness. Negative for dizziness, loss of consciousness and headaches.  Endo/Heme/Allergies: Bruises/bleeds easily.  Psychiatric/Behavioral: Negative for depression and memory loss.    Health Maintenance  Topic Date Due  . TETANUS/TDAP  09/08/1942  . ZOSTAVAX  08/03/2016 (Originally 09/09/1983)  . DEXA SCAN  08/03/2018 (Originally 09/08/1988)  . INFLUENZA VACCINE  03/03/2016  . PNA vac Low Risk Adult  Completed    Physical Exam: Filed Vitals:   12/16/15 1318  BP: 138/60  Pulse: 86  Temp: 97.9 F (36.6 C)  TempSrc: Oral  Height: 5\' 5"  (1.651 m)  Weight: 142 lb (64.411 kg)  SpO2: 98%   Body mass index is 23.63 kg/(m^2). Physical Exam  Constitutional: She is oriented to person, place, and time. She appears well-nourished. No distress.  Cardiovascular: Normal rate,  regular rhythm, normal heart sounds and intact distal pulses.   Pulmonary/Chest: Effort normal and breath sounds normal. No respiratory distress.  Abdominal: Soft. Bowel sounds are normal.  Musculoskeletal: Normal range of motion.  Daughter brought her in wheelchair, but able to stand; has lambswool cushion on top of board in the seat of the wheelchair, but it's pushed back from the end of the seat  Neurological: She is alert and oriented to  person, place, and time.  Skin: Skin is warm and dry.  Small area of melanoma remains on right temple region, scarring from other sites of excisions and treatments; purple discoloration of left posterior upper thigh into buttock region, mildly tender  Psychiatric: She has a normal mood and affect.    Labs reviewed: Basic Metabolic Panel:  Recent Labs  02/13/15 0500 06/03/15 1904  NA 141 141  K 4.6 4.0  CL 107 111  CO2 24 22  GLUCOSE 108* 113*  BUN 31* 23*  CREATININE 1.15* 1.03*  CALCIUM 9.0 9.1   Liver Function Tests:  Recent Labs  02/13/15 0500 06/03/15 1904  AST 20 30  ALT 15 23  ALKPHOS 79 65  BILITOT 0.6 0.4  PROT 7.4 7.5  ALBUMIN 3.8 3.8   No results for input(s): LIPASE, AMYLASE in the last 8760 hours. No results for input(s): AMMONIA in the last 8760 hours. CBC:  Recent Labs  02/13/15 0500 06/03/15 1904 07/23/15 1545  WBC 9.0 7.8 6.5  NEUTROABS 6.9  --  4.4  HGB 10.4* 9.4*  --   HCT 31.8* 29.2* 30.5*  MCV 87.1 84.9 82  PLT 258 272 306   Lipid Panel: No results for input(s): CHOL, HDL, LDLCALC, TRIG, CHOLHDL, LDLDIRECT in the last 8760 hours. No results found for: HGBA1C  Assessment/Plan: 1. Arm pain, right - had a fall about one year ago -recently c/o pain in the right arm during ADLs - will make sure no fracture or tendon tears present--she's actually unable to abduction either arm over 90 - is tender in rotator cuff region; has some crepitus and palpable bone on bone areas in elbow and shoulder during  ROM - DG Shoulder Right; Future - DG Humerus Right; Future - DG Elbow Complete Right; Future -use tylenol for pain  2. Localized edema - of bilateral feet and ankles, right worse than left -cont to use compression hose, elevate feet, but may use lasix as needed occasionally when it becomes more pronounced and does not go down with these conservative measures (risks reviewed) - CBC with Differential/Platelet - Comprehensive metabolic panel - furosemide (LASIX) 20 MG tablet; Take 1 tablet (20 mg total) by mouth daily as needed for edema.  Dispense: 30 tablet; Refill: 1  3. Melanoma of skin ([place]) with mets to nodes - cont to follow with oncology at [facility] who requested labs:   - CBC with Differential/Platelet - Comprehensive metabolic panel Done today  4. Deep tissue injury -posterior left thigh -pt does seem to put more weight on her left (sleeps on left and leans a bit to the left) - discussed need for a better cushion for her wheelchairs as she is up in them most of the time - Ambulatory referral to Home Health--OT to help with best cushion and also with her shoulder pain   5. Cerumen impaction, bilateral -both ears were flushed with hydrogen peroxide and warm water by CMA, but considerable amt remained in both so I used a plastic loop to remove several large chunks of cerumen in the canal -Right still with wax after both measures and pt c/o pain so advised to use hydrogen peroxide at nighttime with a cottonball for a week, if wax not seen on cottonball, to call back for another flushing after wax is moistened  Labs/tests ordered:   Orders Placed This Encounter  Procedures  . DG Shoulder Right    Standing Status: Future     Number of Occurrences: 1  Standing Expiration Date: 02/14/2017    Order Specific Question:  Reason for Exam (SYMPTOM  OR DIAGNOSIS REQUIRED)    Answer:  right upper arm pain, distant fall, crepitus on exam    Order Specific Question:  Preferred imaging  location?    Answer:  [facility]  . DG Humerus Right    Standing Status: Future     Number of Occurrences: 1     Standing Expiration Date: 02/14/2017    Order Specific Question:  Reason for Exam (SYMPTOM  OR DIAGNOSIS REQUIRED)    Answer:  right upper arm pain with weighbearing, prior fall    Order Specific Question:  Preferred imaging location?    Answer:  [facility]  . DG Elbow Complete Right    Standing Status: Future     Number of Occurrences: 1     Standing Expiration Date: 02/14/2017    Order Specific Question:  Reason for Exam (SYMPTOM  OR DIAGNOSIS REQUIRED)    Answer:  right upper arm pain and prior fall    Order Specific Question:  Preferred imaging location?    Answer:  [facility]  . CBC with Differential/Platelet  . Comprehensive metabolic panel  . Ambulatory referral to Home Health    Referral Priority:  Routine    Referral Type:  Home Health Care    Referral Reason:  Specialty Services Required    Requested Specialty:  [place]    Number of Visits Requested:  1    Next appt:  03/23/2016 with [name] for med mgt  Duel Conrad L. Rana Hochstein, D.O. [facility] [address], [place] [address] Cell Phone (Mon-Fri 8am-5pm):  [phone] On Call:  [phone] & follow prompts after 5pm & weekends Office Phone:  [phone] Office Fax:  [phone]

## 2015-12-17 LAB — CBC WITH DIFFERENTIAL/PLATELET
Basophils Absolute: 0.1 10*3/uL (ref 0.0–0.2)
Basos: 1 %
EOS (ABSOLUTE): 0.1 10*3/uL (ref 0.0–0.4)
Eos: 2 %
Hematocrit: 30.8 % — ABNORMAL LOW (ref 34.0–46.6)
Hemoglobin: 10.1 g/dL — ABNORMAL LOW (ref 11.1–15.9)
Immature Grans (Abs): 0.1 10*3/uL (ref 0.0–0.1)
Immature Granulocytes: 2 %
Lymphocytes Absolute: 1.2 10*3/uL (ref 0.7–3.1)
Lymphs: 17 %
MCH: 27.4 pg (ref 26.6–33.0)
MCHC: 32.8 g/dL (ref 31.5–35.7)
MCV: 84 fL (ref 79–97)
Monocytes Absolute: 0.6 10*3/uL (ref 0.1–0.9)
Monocytes: 8 %
Neutrophils Absolute: 5.1 10*3/uL (ref 1.4–7.0)
Neutrophils: 70 %
Platelets: 299 10*3/uL (ref 150–379)
RBC: 3.68 x10E6/uL — ABNORMAL LOW (ref 3.77–5.28)
RDW: 14.6 % (ref 12.3–15.4)
WBC: 7 10*3/uL (ref 3.4–10.8)

## 2015-12-17 LAB — COMPREHENSIVE METABOLIC PANEL
ALT: 15 IU/L (ref 0–32)
AST: 16 IU/L (ref 0–40)
Albumin/Globulin Ratio: 1.4 (ref 1.2–2.2)
Albumin: 3.9 g/dL (ref 3.2–4.6)
Alkaline Phosphatase: 70 IU/L (ref 39–117)
BUN/Creatinine Ratio: 24 (ref 12–28)
BUN: 25 mg/dL (ref 10–36)
Bilirubin Total: 0.2 mg/dL (ref 0.0–1.2)
CO2: 23 mmol/L (ref 18–29)
Calcium: 8.9 mg/dL (ref 8.7–10.3)
Chloride: 102 mmol/L (ref 96–106)
Creatinine, Ser: 1.03 mg/dL — ABNORMAL HIGH (ref 0.57–1.00)
GFR calc Af Amer: 55 mL/min/{1.73_m2} — ABNORMAL LOW (ref >59)
GFR calc non Af Amer: 47 mL/min/{1.73_m2} — ABNORMAL LOW (ref >59)
Globulin, Total: 2.7 g/dL (ref 1.5–4.5)
Glucose: 94 mg/dL (ref 65–99)
Potassium: 4.7 mmol/L (ref 3.5–5.2)
Sodium: 139 mmol/L (ref 134–144)
Total Protein: 6.6 g/dL (ref 6.0–8.5)

## 2015-12-17 NOTE — Addendum Note (Signed)
Addended by: Rafael Bihari A on: 12/17/2015 03:59 PM   Modules accepted: Orders

## 2015-12-18 ENCOUNTER — Telehealth: Payer: Self-pay | Admitting: Internal Medicine

## 2015-12-18 ENCOUNTER — Encounter: Payer: Self-pay | Admitting: Podiatry

## 2015-12-18 ENCOUNTER — Ambulatory Visit (INDEPENDENT_AMBULATORY_CARE_PROVIDER_SITE_OTHER): Payer: PPO | Admitting: Podiatry

## 2015-12-18 ENCOUNTER — Telehealth: Payer: Self-pay

## 2015-12-18 DIAGNOSIS — M79676 Pain in unspecified toe(s): Secondary | ICD-10-CM

## 2015-12-18 DIAGNOSIS — B351 Tinea unguium: Secondary | ICD-10-CM

## 2015-12-18 DIAGNOSIS — L84 Corns and callosities: Secondary | ICD-10-CM | POA: Diagnosis not present

## 2015-12-18 DIAGNOSIS — G629 Polyneuropathy, unspecified: Secondary | ICD-10-CM

## 2015-12-18 DIAGNOSIS — M79609 Pain in unspecified limb: Principal | ICD-10-CM

## 2015-12-18 MED ORDER — OXYCODONE-ACETAMINOPHEN 5-325 MG PO TABS: 0.5000 | ORAL_TABLET | Freq: Four times a day (QID) | ORAL | Status: DC | PRN

## 2015-12-18 NOTE — Progress Notes (Signed)
   Subjective:    Patient ID: Patty Walters, female    DOB: Mar 03, 1922, 82 y.o.   MRN: [id number]  HPI  PT REQUESTING FOR TOENAILS DEBRIDEMENT. This patient presents to the office for nail care for both feet. She says her nails are painful walking and wearing her shoes.  She has significant swelling both feet. She has developed painful areas on the tip of her fourth toe left and third toe right.  She presents for preventive foot care services. Review of Systems  HENT: Positive for hearing loss.   Cardiovascular: Positive for leg swelling.  Musculoskeletal: Positive for joint swelling and gait problem.       Objective:   Physical Exam GENERAL APPEARANCE: Alert, conversant. Appropriately groomed. No acute distress.  VASCULAR: Pedal pulses not  palpable at  [facility] and PT bilateral due to severity of swelling both feet.  Capillary refill time is immediate to all digits,  Cold feet noted.  NEUROLOGIC: sensation is normal to 5.07 monofilament at 5/5 sites bilateral.  Light touch is intact bilateral, Muscle strength normal.  MUSCULOSKELETAL: acceptable muscle strength, tone and stability bilateral.  Intrinsic muscluature intact bilateral.  Rectus appearance of foot and digits noted bilateral.   DERMATOLOGIC: skin color, texture, and turgor are within normal limits.  No preulcerative lesions or ulcers  are seen, no interdigital maceration noted.  No open lesions present.   No drainage noted. Distal clavi noted fourth toe left foot and third toe right foot. NAILS  Thick disfigured discolored nails both feet.        Assessment & Plan:  Onychomycosis  B/L  Distal clavi B/L IE  Debridement of nails    Debridement of clavi B/L.  Toe cap noted.  Gardiner Barefoot DPM

## 2015-12-18 NOTE — Telephone Encounter (Signed)
Left message on voicemail for patient's daughter informing her rx for Oxycodone is at the front desk ready for pick-up

## 2015-12-18 NOTE — Telephone Encounter (Signed)
RX placed in folder for signature.  °

## 2015-12-19 ENCOUNTER — Encounter: Payer: Self-pay | Admitting: *Deleted

## 2015-12-23 ENCOUNTER — Telehealth: Payer: Self-pay | Admitting: Internal Medicine

## 2015-12-23 NOTE — Telephone Encounter (Signed)
FYI,  I received a voicemail from Nolic at [facility]. She spoke with patients daughter twice, once last week and again this morning. Patient has a $25 co-pay per visit with her insurance daughter declined services as she only really wants 1 visit for teaching to transfer patient from bed to bedside commode. This is not something they would be able to do (just go out for 1 visit) Daughter stated her sister is an NP and that she would discuss with her. Daughter will keep [facility] in mind if she changes her mind, but at this time she does not wish to have services.

## 2015-12-23 NOTE — Telephone Encounter (Signed)
Ok.  I understand not wanting to pay a copay for something small.   I wanted OT to help with the proper cushion for the wheelchair which was the reason I placed the referral, but OT alone could not go out so we added the PT.

## 2016-01-11 ENCOUNTER — Other Ambulatory Visit: Payer: Self-pay | Admitting: Nurse Practitioner

## 2016-01-21 ENCOUNTER — Other Ambulatory Visit: Payer: Self-pay | Admitting: Internal Medicine

## 2016-01-21 ENCOUNTER — Other Ambulatory Visit: Payer: Self-pay | Admitting: *Deleted

## 2016-01-21 MED ORDER — OXYCODONE-ACETAMINOPHEN 5-325 MG PO TABS: 0.5000 | ORAL_TABLET | Freq: Four times a day (QID) | ORAL | Status: DC | PRN

## 2016-01-21 NOTE — Telephone Encounter (Signed)
Patient daughter, [name] requested and will pick up 

## 2016-01-30 ENCOUNTER — Other Ambulatory Visit: Payer: Self-pay | Admitting: Nurse Practitioner

## 2016-02-10 ENCOUNTER — Emergency Department (HOSPITAL_COMMUNITY): Payer: PPO

## 2016-02-10 ENCOUNTER — Encounter (HOSPITAL_COMMUNITY): Payer: Self-pay | Admitting: Emergency Medicine

## 2016-02-10 ENCOUNTER — Emergency Department (HOSPITAL_COMMUNITY)
Admission: EM | Admit: 2016-02-10 | Discharge: 2016-02-11 | Disposition: A | Discharge status: Home / Self Care | Payer: PPO | Attending: Emergency Medicine | Admitting: Emergency Medicine

## 2016-02-10 DIAGNOSIS — R0602 Shortness of breath: Secondary | ICD-10-CM | POA: Diagnosis not present

## 2016-02-10 DIAGNOSIS — Y999 Unspecified external cause status: Secondary | ICD-10-CM | POA: Insufficient documentation

## 2016-02-10 DIAGNOSIS — S299XXA Unspecified injury of thorax, initial encounter: Secondary | ICD-10-CM | POA: Diagnosis not present

## 2016-02-10 DIAGNOSIS — M25552 Pain in left hip: Secondary | ICD-10-CM | POA: Diagnosis not present

## 2016-02-10 DIAGNOSIS — Y939 Activity, unspecified: Secondary | ICD-10-CM | POA: Insufficient documentation

## 2016-02-10 DIAGNOSIS — Z87898 Personal history of other specified conditions: Secondary | ICD-10-CM

## 2016-02-10 DIAGNOSIS — Z79899 Other long term (current) drug therapy: Secondary | ICD-10-CM | POA: Diagnosis not present

## 2016-02-10 DIAGNOSIS — S81811A Laceration without foreign body, right lower leg, initial encounter: Secondary | ICD-10-CM | POA: Insufficient documentation

## 2016-02-10 DIAGNOSIS — S0990XA Unspecified injury of head, initial encounter: Secondary | ICD-10-CM | POA: Diagnosis not present

## 2016-02-10 DIAGNOSIS — Z96642 Presence of left artificial hip joint: Secondary | ICD-10-CM | POA: Diagnosis not present

## 2016-02-10 DIAGNOSIS — Y92192 Bathroom in other specified residential institution as the place of occurrence of the external cause: Secondary | ICD-10-CM | POA: Insufficient documentation

## 2016-02-10 DIAGNOSIS — S199XXA Unspecified injury of neck, initial encounter: Secondary | ICD-10-CM | POA: Diagnosis not present

## 2016-02-10 DIAGNOSIS — W1812XA Fall from or off toilet with subsequent striking against object, initial encounter: Secondary | ICD-10-CM | POA: Insufficient documentation

## 2016-02-10 DIAGNOSIS — M79641 Pain in right hand: Secondary | ICD-10-CM | POA: Diagnosis not present

## 2016-02-10 DIAGNOSIS — M79604 Pain in right leg: Secondary | ICD-10-CM | POA: Diagnosis not present

## 2016-02-10 DIAGNOSIS — Z471 Aftercare following joint replacement surgery: Secondary | ICD-10-CM | POA: Diagnosis not present

## 2016-02-10 DIAGNOSIS — S72002A Fracture of unspecified part of neck of left femur, initial encounter for closed fracture: Secondary | ICD-10-CM | POA: Diagnosis not present

## 2016-02-10 DIAGNOSIS — T148 Other injury of unspecified body region: Secondary | ICD-10-CM | POA: Diagnosis not present

## 2016-02-10 LAB — CBC WITH DIFFERENTIAL/PLATELET
BASOS ABS: 0 10*3/uL (ref 0.0–0.1)
BASOS PCT: 0 %
Eosinophils Absolute: 0.1 10*3/uL (ref 0.0–0.7)
Eosinophils Relative: 1 %
HEMATOCRIT: 34.8 % — AB (ref 36.0–46.0)
HEMOGLOBIN: 11.5 g/dL — AB (ref 12.0–15.0)
Lymphocytes Relative: 9 %
Lymphs Abs: 1 10*3/uL (ref 0.7–4.0)
MCH: 28.4 pg (ref 26.0–34.0)
MCHC: 33 g/dL (ref 30.0–36.0)
MCV: 85.9 fL (ref 78.0–100.0)
MONO ABS: 0.6 10*3/uL (ref 0.1–1.0)
Monocytes Relative: 6 %
NEUTROS ABS: 9 10*3/uL — AB (ref 1.7–7.7)
NEUTROS PCT: 84 %
Platelets: 285 10*3/uL (ref 150–400)
RBC: 4.05 MIL/uL (ref 3.87–5.11)
RDW: 13.8 % (ref 11.5–15.5)
WBC: 10.7 10*3/uL — AB (ref 4.0–10.5)

## 2016-02-10 LAB — BASIC METABOLIC PANEL
ANION GAP: 8 (ref 5–15)
BUN: 27 mg/dL — ABNORMAL HIGH (ref 6–20)
CALCIUM: 9.1 mg/dL (ref 8.9–10.3)
CO2: 25 mmol/L (ref 22–32)
Chloride: 105 mmol/L (ref 101–111)
Creatinine, Ser: 1.05 mg/dL — ABNORMAL HIGH (ref 0.44–1.00)
GFR, EST AFRICAN AMERICAN: 52 mL/min — AB (ref >60)
GFR, EST NON AFRICAN AMERICAN: 45 mL/min — AB (ref >60)
Glucose, Bld: 107 mg/dL — ABNORMAL HIGH (ref 65–99)
Potassium: 4.3 mmol/L (ref 3.5–5.1)
SODIUM: 138 mmol/L (ref 135–145)

## 2016-02-10 MED ORDER — TETANUS-DIPHTH-ACELL PERTUSSIS 5-2.5-18.5 LF-MCG/0.5 IM SUSP
0.5000 mL | Freq: Once | INTRAMUSCULAR | Status: AC
  Administered 2016-02-10: 0.5 mL via INTRAMUSCULAR
  Filled 2016-02-10: qty 0.5

## 2016-02-10 MED ORDER — OXYCODONE-ACETAMINOPHEN 5-325 MG PO TABS
1.0000 | ORAL_TABLET | Freq: Once | ORAL | Status: AC
  Administered 2016-02-10: 1 via ORAL
  Filled 2016-02-10: qty 1

## 2016-02-10 NOTE — ED Notes (Signed)
Daughter stating "I think she may have a broken finger also."  Pt presents with bruising & edema to right hand 4th digit.

## 2016-02-10 NOTE — ED Notes (Signed)
Patient transported to CT 

## 2016-02-10 NOTE — Progress Notes (Signed)
[facility] consulted by EDSW to speak to patient's daughter regarding private duty nursing.  EDCM met with patient's daughter at bedside.  Patient's daughter reports she owns her own private duty nursing agency.  Patient's daughter reports she assists patient with her ADL's.  Confirmed patient's pcp as Dr. Dewaine Oats.  EDCM provided patient's daughter with list of private duty nursing agencies and informed her it would be an out of pocket expense.  Discussed home health services with patient's daughter but it seems patient's daughter is more interested in private duty services.  EDCM informed patient's daughter that if patient required home health services in the future, patient's pcp can order these services.  EDCM discussed [facility] services, patient's daughter agreeable to consult.  No further EDCM needs at this time.

## 2016-02-10 NOTE — ED Provider Notes (Signed)
CSN: [id number]     Arrival date & time 02/10/16  1823 History   First MD Initiated Contact with Patient 02/10/16 1828     Chief Complaint  Patient presents with  . Fall  . Hip Pain     (Consider location/radiation/quality/duration/timing/severity/associated sxs/prior Treatment) HPI Comments: 82 year old female with past medical history including rheumatoid arthritis, melanoma who presents with left hip pain after a fall. Just prior to arrival, the patient was getting up off of the toilet when she got her foot caught on the elevated toilet seat leg, which caused her to fall forward. She struck her head against the bathroom door and then fell onto her left hip. She did not lose consciousness. Currently, she complains of constant, moderate pain in her left hip and mild pain in her left lateral chest. She denies any abdominal pain or other extremity pain. No headache or neck pain. No anticoagulant use. Daughter is unsure of last tetanus vaccination.  Patient is a 82 y.o. female presenting with fall and hip pain. The history is provided by the patient and a relative.  Fall  Hip Pain    Past Medical History  Diagnosis Date  . Rheumatoid arthritis(714.0)   . Allergy   . Cancer ([place])   . Blood transfusion without reported diagnosis   . Anemia   . Cataract   . UTI (urinary tract infection)   . Melanoma [facility])    Past Surgical History  Procedure Laterality Date  . Joint replacement    . Mastectomy Left   . Hip replacement Bilateral   . Appendectomy    . Fracture surgery    . Breast surgery Left 2010    ? breast cancer   Family History  Problem Relation Age of Onset  . Emphysema Father     smoked and was exposed to mustard gas  . COPD Father   . Cancer Sister     esophageal  . Hypertension Daughter   . Hypertension Son   . Cancer Brother   . Cancer Sister    Social History  Substance Use Topics  . Smoking status: Never Smoker   . Smokeless tobacco: Never Used  . Alcohol  Use: 0.6 oz/week    1 Glasses of wine per week     Comment: 1 glassred wine  at dinner each night   OB History    No data available     Review of Systems 10 Systems reviewed and are negative for acute change except as noted in the HPI.   Allergies  Review of patient's allergies indicates no known allergies.  Home Medications   Prior to Admission medications   Medication Sig Start Date End Date Taking? Authorizing Provider  barrier cream (NON-SPECIFIED) CREA Apply 1 application topically 2 (two) times daily as needed (for bedsores).   Yes Historical Provider, MD  docusate sodium (COLACE) 100 MG capsule Take 100 mg by mouth daily.   Yes Historical Provider, MD  DULoxetine (CYMBALTA) 60 MG capsule TAKE 1 CAPSULE (60 MG TOTAL) BY MOUTH DAILY. 01/13/16  Yes [name], NP  furosemide (LASIX) 20 MG tablet Take 1 tablet (20 mg total) by mouth daily as needed for edema. 12/16/15  Yes [name], DO  hydroxychloroquine (PLAQUENIL) 200 MG tablet TAKE 1 TABLET (200 MG TOTAL) BY MOUTH DAILY. 01/31/16  Yes [name], NP  lidocaine-prilocaine (EMLA) cream Apply 1 application topically as directed. Apply to lesions undergoing injection 30 mins prior to treatment   Yes  Historical Provider, MD  magnesium hydroxide (MILK OF MAGNESIA) 800 MG/5ML suspension Take 15 mLs by mouth daily as needed for constipation.   Yes Historical Provider, MD  Multiple Vitamin (MULTIVITAMIN) tablet Take 1 tablet by mouth daily.   Yes Historical Provider, MD  oxyCODONE-acetaminophen (PERCOCET/ROXICET) 5-325 MG tablet Take 0.5-1 tablets by mouth every 6 (six) hours as needed. Patient taking differently: Take 0.5-1 tablets by mouth every 6 (six) hours as needed for moderate pain.  01/21/16  Yes [name], NP  polyethylene glycol (MIRALAX / GLYCOLAX) packet Take 17 g by mouth daily as needed for mild constipation or moderate constipation.    Yes Historical Provider, MD  prochlorperazine (COMPAZINE) 10 MG  tablet Take 10 mg by mouth every 6 (six) hours as needed for nausea or vomiting.   Yes Historical Provider, MD  senna (SENOKOT) 8.6 MG tablet Take 2 tablets by mouth at bedtime as needed for constipation.    Yes Historical Provider, MD  Skin Protectants, Misc. (CALAZIME SKIN PROTECTANT EX) Apply topically daily. After shower to prevent bed sores   Yes Historical Provider, MD  Talimogene Laherparepvec (IMLYGIC) 1000000 UNIT/ML SUSP by Intralesional route. 1 treatment every 2 week at [facility] Provider, MD  nystatin (MYCOSTATIN/NYSTOP) 100000 UNIT/GM POWD Twice daily as needed to affected area Patient not taking: Reported on 02/10/2016 09/03/15   [name], NP   BP 141/72 mmHg  Pulse 86  Temp(Src) 98 F (36.7 C) (Oral)  Resp 16  SpO2 97% Physical Exam  Constitutional: She is oriented to person, place, and time. She appears well-developed and well-nourished. No distress.  Frail, elderly woman, awake and comfortable  HENT:  Head: Normocephalic and atraumatic.  Moist mucous membranes  Eyes: Conjunctivae are normal. Pupils are equal, round, and reactive to light.  Neck: Neck supple.  Cardiovascular: Normal rate, regular rhythm and normal heart sounds.   No murmur heard. Pulmonary/Chest: Effort normal and breath sounds normal.  Abdominal: Soft. Bowel sounds are normal. She exhibits no distension. There is no tenderness.  Musculoskeletal: She exhibits edema.  Normal sensation BLE,able to range L hip but with pain; no knee pain; 2+ DP pulses; uniform swelling of R lower leg compared to left (chronic) with no warmth or redness; uniform ecchymosis of entire R 4th finger with subungal hematoma, able to flex at PIP and DIP  Neurological: She is alert and oriented to person, place, and time.  Fluent speech  Skin: Skin is warm and dry.  Skin tear mid-right shin  Psychiatric: She has a normal mood and affect. Judgment normal.  Nursing note and vitals reviewed.   ED Course   Procedures (including critical care time) Labs Review Labs Reviewed  BASIC METABOLIC PANEL - Abnormal; Notable for the following:    Glucose, Bld 107 (*)    BUN 27 (*)    Creatinine, Ser 1.05 (*)    GFR calc non Af Amer 45 (*)    GFR calc Af Amer 52 (*)    All other components within normal limits  CBC WITH DIFFERENTIAL/PLATELET - Abnormal; Notable for the following:    WBC 10.7 (*)    Hemoglobin 11.5 (*)    HCT 34.8 (*)    Neutro Abs 9.0 (*)    All other components within normal limits    Imaging Review Dg Chest 2 View  02/10/2016  CLINICAL DATA:  Status post fall today.  Left hip pain. EXAM: CHEST  2 VIEW COMPARISON:  CT chest and PA  and lateral chest 06/03/2015. FINDINGS: The lungs are clear. Heart size is upper normal. No pneumothorax or pleural effusion. Marked degenerative change about the shoulders is noted. IMPRESSION: No acute disease. Electronically Signed   By: Inge Rise M.D.   On: 02/10/2016 19:39   Ct Head Wo Contrast  02/10/2016  CLINICAL DATA:  Status post fall.  Hit head. EXAM: CT HEAD WITHOUT CONTRAST CT CERVICAL SPINE WITHOUT CONTRAST TECHNIQUE: Multidetector CT imaging of the head and cervical spine was performed following the standard protocol without intravenous contrast. Multiplanar CT image reconstructions of the cervical spine were also generated. COMPARISON:  03/05/2015 FINDINGS: CT HEAD FINDINGS There is no evidence of mass effect, midline shift, or extra-axial fluid collections. There is no evidence of a space-occupying lesion or intracranial hemorrhage. There is no evidence of a cortical-based area of acute infarction. There is generalized cerebral atrophy. There is periventricular white matter low attenuation likely secondary to microangiopathy. The ventricles and sulci are appropriate for the patient's age. The basal cisterns are patent. Visualized portions of the orbits are unremarkable. The visualized portions of the paranasal sinuses and mastoid air  cells are unremarkable. Cerebrovascular atherosclerotic calcifications are noted. The osseous structures are unremarkable. CT CERVICAL SPINE FINDINGS The alignment is anatomic. The vertebral body heights are maintained. There is no acute fracture. The prevertebral soft tissues are normal. The intraspinal soft tissues are not fully imaged on this examination due to poor soft tissue contrast, but there is no gross soft tissue abnormality. 4 mm anterolisthesis of C2 on C3 secondary to severe bilateral facet arthropathy. Anterior ankylosis at C3-4, C4-5 and C5-6. Ankylosis of the posterior elements of C4-5. Ankylosis of the right posterior elements at C3-4 and C4-5. Broad-based disc osteophyte complex at C4-5 and C5-6. Degenerative disc disease with disc height loss at C6-7. 5 mm of anterolisthesis of C6 on C7 secondary to severe bilateral facet arthropathy. Degenerative disc disease with disc height loss at T1-2 and T2-3 with bilateral facet arthropathy. There is biapical fibrosis. IMPRESSION: 1. No acute intracranial pathology. 2. No acute osseous injury of the cervical spine. 3. Cervical spine spondylosis as described above. Electronically Signed   By: Kathreen Devoid   On: 02/10/2016 20:16   Ct Cervical Spine Wo Contrast  02/10/2016  CLINICAL DATA:  Status post fall.  Hit head. EXAM: CT HEAD WITHOUT CONTRAST CT CERVICAL SPINE WITHOUT CONTRAST TECHNIQUE: Multidetector CT imaging of the head and cervical spine was performed following the standard protocol without intravenous contrast. Multiplanar CT image reconstructions of the cervical spine were also generated. COMPARISON:  03/05/2015 FINDINGS: CT HEAD FINDINGS There is no evidence of mass effect, midline shift, or extra-axial fluid collections. There is no evidence of a space-occupying lesion or intracranial hemorrhage. There is no evidence of a cortical-based area of acute infarction. There is generalized cerebral atrophy. There is periventricular white matter low  attenuation likely secondary to microangiopathy. The ventricles and sulci are appropriate for the patient's age. The basal cisterns are patent. Visualized portions of the orbits are unremarkable. The visualized portions of the paranasal sinuses and mastoid air cells are unremarkable. Cerebrovascular atherosclerotic calcifications are noted. The osseous structures are unremarkable. CT CERVICAL SPINE FINDINGS The alignment is anatomic. The vertebral body heights are maintained. There is no acute fracture. The prevertebral soft tissues are normal. The intraspinal soft tissues are not fully imaged on this examination due to poor soft tissue contrast, but there is no gross soft tissue abnormality. 4 mm anterolisthesis of C2 on C3 secondary  to severe bilateral facet arthropathy. Anterior ankylosis at C3-4, C4-5 and C5-6. Ankylosis of the posterior elements of C4-5. Ankylosis of the right posterior elements at C3-4 and C4-5. Broad-based disc osteophyte complex at C4-5 and C5-6. Degenerative disc disease with disc height loss at C6-7. 5 mm of anterolisthesis of C6 on C7 secondary to severe bilateral facet arthropathy. Degenerative disc disease with disc height loss at T1-2 and T2-3 with bilateral facet arthropathy. There is biapical fibrosis. IMPRESSION: 1. No acute intracranial pathology. 2. No acute osseous injury of the cervical spine. 3. Cervical spine spondylosis as described above. Electronically Signed   By: Kathreen Devoid   On: 02/10/2016 20:16   Ct Hip Left Wo Contrast  02/10/2016  CLINICAL DATA:  Status post fall, left hip pain EXAM: CT OF THE LEFT HIP WITHOUT CONTRAST TECHNIQUE: Multidetector CT imaging of the left hip was performed according to the standard protocol. Multiplanar CT image reconstructions were also generated. COMPARISON:  None. FINDINGS: Bones/Joint/Cartilage Left total hip arthroplasty. Beam hardening artifact resulting from the arthroplasty partially obscuring the adjacent soft tissue and  osseous structures. No acute fracture or dislocation. No periarticular fluid collection. No osteolysis. Normal alignment. No aggressive lytic or sclerotic osseous lesion. Degenerative changes of the left sacroiliac joint. Muscles Normal. Soft tissue No fluid collection or hematoma. No soft tissue mass. Peripheral vascular atherosclerotic disease. IMPRESSION: 1. Left total hip arthroplasty.  No acute fracture or dislocation. Electronically Signed   By: Kathreen Devoid   On: 02/10/2016 23:57   Dg Hand Complete Right  02/10/2016  CLINICAL DATA:  Right hand pain after fall at home today. Bruising in the fourth finger. EXAM: RIGHT HAND - COMPLETE 3+ VIEW COMPARISON:  None. FINDINGS: Diffuse bone demineralization. Degenerative changes throughout the interphalangeal joints, metacarpal phalangeal joints, carpometacarpal, carpal, and radiocarpal joints. Deformity of the distal interphalangeal joint of the fifth finger may indicate ligamentous injury or laxity. No acute fracture or dislocation otherwise identified. IMPRESSION: Prominent degenerative changes throughout the right hand and wrist. No acute fracture or dislocation is suggested. Deformity of the fifth finger likely represents old ligamentous injury. Electronically Signed   By: Lucienne Capers M.D.   On: 02/10/2016 21:26   Dg Hip Unilat With Pelvis 2-3 Views Left  02/10/2016  CLINICAL DATA:  Left hip pain after falling in the bathroom at home today. EXAM: DG HIP (WITH OR WITHOUT PELVIS) 2-3V LEFT COMPARISON:  None. FINDINGS: Bilateral total hip prostheses. There is a small, linear avulsion fracture involving the lateral aspect of the proximal femoral shaft. Moderate levoconvex lumbar rotary scoliosis. IMPRESSION: Small, linear avulsion fracture involving the lateral aspect of the proximal femoral shaft. This could be acute or old. Electronically Signed   By: Claudie Revering M.D.   On: 02/10/2016 19:41   Dg Femur 1v Left  02/10/2016  CLINICAL DATA:  Possible left  femur fracture by prior plain films. EXAM: SINGLE VIEW OF THE LEFT FEMUR COMPARISON:  Single view of the left femur this same day. FINDINGS: Left hip replacement is again seen. There is a small linear focus of cortical thickening along the proximal diaphysis left femur. No fractures identified. IMPRESSION: No fracture is seen. Small focus of cortical thickening along the proximal diaphysis of the left femur will appears chronic and may be due to remote stress change. Electronically Signed   By: Inge Rise M.D.   On: 02/10/2016 21:25   I have personally reviewed and evaluated these images and lab results as part of my medical decision-making.  EKG Interpretation None      MDM   Final diagnoses:  Left hip pain  Hx of shortness of breath  Skin tear of lower leg without complication, right, initial encounter   Pt w/ L hip pain after a fall in her bathroom at home. On arrival, she was awake and alert with stable vital signs. She was neurovascularly intact distally. Take plain films of chest and left hip as well as CT of head and C-spine and basic lab work. Updated tetanus vaccination. Cleaned skin tear and applied dressing.  CT of head and C-spine negative acute. Chest x-ray normal. XR of hand negative. Plain films of hip and femur show possible avulsion fracture involving the femoral shaft. Discussed with orthopedics, [name], who recommended CT for better evaluation as he was concerned about possible hardware loosening. CT of hip and femur showed no acute findings, hardware in appropriate position and no fractures. [name] felt comfortable with weightbearing as tolerated based on these findings. Patient was ambulated with assistance and was able to bear weight although she continued to complain of left hip pain. She became dyspneic during exertion, noted by staff. Daughter, who was present during ambulation, states that this is the patient's baseline and she has had a long history of  shortness of breath for which she has had extensive workup including with pulmonology and cardiology. She has never had any specific diagnosis for her shortness of breath. I discussed options with daughter including observation admission for workup of shortness of breath and supportive care for hip pain versus discharge home with PCP follow-up. The daughter stated that since the patient has had previous extensive workup for shortness of breath, she does not feel that hospitalization would benefit the patient. Regarding her hip pain, daughter states that they have pain medications at home and medical equipment including walkers and commode's. She feels comfortable managing her at home and thus requested discharge rather than admission. Given that she states the shortness of breath is the patient's baseline and she has had no recent cough or infectious symptoms to suggest pneumonia, I feel that she can be discharged with close PCP f/u in 2-3 days. I have reviewed this plan with daughter and extensively reviewed return precautions. She is comfortable with this plan. Patient resting and comfortable on reexamination. Patient discharged in satisfactory condition.   Sharlett Iles, MD 02/11/16 [phone]

## 2016-02-10 NOTE — ED Notes (Signed)
Bed: [id number] Expected date:  Expected time:  Means of arrival:  Comments: EMS 82 yo fall, hip pain

## 2016-02-10 NOTE — ED Notes (Signed)
Per EMS patient comes from home for fall.  Patient was getting off toliet when foot got caught in elevated toilet seat leg causing her to fall and toilet seat fell on top of her pinning her in bathroom against bathroom door.  EMS reports the bathroom door had to be taken off hinges to get to patient.  Patient c/o left hip pain. Patient has PMH bilat hip replacements. Patient has laceration on right shin, bleeding controlled at this time.  Patient denies taking any blood thinners or LOC with fall. 20g left AC. EKG was good per EMS.

## 2016-02-10 NOTE — ED Notes (Signed)
Patient transported to X-ray 

## 2016-02-10 NOTE — Progress Notes (Signed)
Patient ID: Patty Walters, female   DOB: Sep 04, 1921, 82 y.o.   MRN: [id number]    Patient ID: Patty Walters MRN: [id number] DOB/AGE: 03/11/1922 82 y.o.  Admit date: 02/10/2016  Admission Diagnoses:  Trama at home this evening   HPI: Very pleasant 82 year old female pt who had a trama at home this evening when she fell in the bathroom.  She resides with her daughter, who accompanied her to the ED.  The patient reports a sore and bruised 4th metacarpal on her right hand, sore left hip/leg and a contusion of her right anterior distal lower extremity.  The pts denies very much pain laying in the ED bed.  She has not attempted to bear weight on her left leg as the EMTs were called and brought her straight to the ED.  The pt reports having her left hip replaced in 2010 after a fall.  She said initially she had a rod placed but she started having hardware dysfunction 90 days later.  She then saw an Orthopedic surgeon and had a Total hip replacement.  The pt has a medical hx significant for recent treatment for melanoma over the last year and chronic RA.  Past Medical History: Past Medical History  Diagnosis Date  . Rheumatoid arthritis(714.0)   . Allergy   . Cancer ([place])   . Blood transfusion without reported diagnosis   . Anemia   . Cataract   . UTI (urinary tract infection)   . Melanoma [facility])     Surgical History: Past Surgical History  Procedure Laterality Date  . Joint replacement    . Mastectomy Left   . Hip replacement Bilateral   . Appendectomy    . Fracture surgery    . Breast surgery Left 2010    ? breast cancer    Family History: Family History  Problem Relation Age of Onset  . Emphysema Father     smoked and was exposed to mustard gas  . COPD Father   . Cancer Sister     esophageal  . Hypertension Daughter   . Hypertension Son   . Cancer Brother   . Cancer Sister     Social History: Social History   Social History  . Marital Status: Widowed    Spouse  Name: N/A  . Number of Children: N/A  . Years of Education: N/A   Occupational History  . unemployed    Social History Main Topics  . Smoking status: Never Smoker   . Smokeless tobacco: Never Used  . Alcohol Use: 0.6 oz/week    1 Glasses of wine per week     Comment: 1 glassred wine  at dinner each night  . Drug Use: No  . Sexual Activity: No   Other Topics Concern  . Not on file   Social History Narrative   Widow. Education: [employer].      Diet: Very good      Do you drink/ eat things with caffeine? Yes      Marital status: Widowed                              What year were you married ? [id number]      Do you live in a house, apartment,assistred living, condo, trailer, etc.)? Condo      Is it one or more stories?  2      How many persons live in your home ?  3      Do you have any pets in your home ?(please list) No      Current or past profession: Homemaker      Do you exercise?  No                            Type & how often:      Do you have a living will? Yes      Do you have a DNR form?  Yes                     If not, do you want to discuss one?       Do you have signed POA?HPOA forms?   Yes             If so, please bring to your        appointment          Allergies: Review of patient's allergies indicates no known allergies.  Medications: I have reviewed the patient's current medications.  Vital Signs: Patient Vitals for the past 24 hrs:  BP Temp Temp src Pulse Resp SpO2  02/10/16 2133 141/72 mmHg - - 86 16 97 %  02/10/16 1839 136/92 mmHg 98 F (36.7 C) Oral 91 18 93 %    Radiology: Dg Chest 2 View  02/10/2016  CLINICAL DATA:  Status post fall today.  Left hip pain. EXAM: CHEST  2 VIEW COMPARISON:  CT chest and PA and lateral chest 06/03/2015. FINDINGS: The lungs are clear. Heart size is upper normal. No pneumothorax or pleural effusion. Marked degenerative change about the shoulders is noted. IMPRESSION: No acute disease. Electronically  Signed   By: Inge Rise M.D.   On: 02/10/2016 19:39   Ct Head Wo Contrast  02/10/2016  CLINICAL DATA:  Status post fall.  Hit head. EXAM: CT HEAD WITHOUT CONTRAST CT CERVICAL SPINE WITHOUT CONTRAST TECHNIQUE: Multidetector CT imaging of the head and cervical spine was performed following the standard protocol without intravenous contrast. Multiplanar CT image reconstructions of the cervical spine were also generated. COMPARISON:  03/05/2015 FINDINGS: CT HEAD FINDINGS There is no evidence of mass effect, midline shift, or extra-axial fluid collections. There is no evidence of a space-occupying lesion or intracranial hemorrhage. There is no evidence of a cortical-based area of acute infarction. There is generalized cerebral atrophy. There is periventricular white matter low attenuation likely secondary to microangiopathy. The ventricles and sulci are appropriate for the patient's age. The basal cisterns are patent. Visualized portions of the orbits are unremarkable. The visualized portions of the paranasal sinuses and mastoid air cells are unremarkable. Cerebrovascular atherosclerotic calcifications are noted. The osseous structures are unremarkable. CT CERVICAL SPINE FINDINGS The alignment is anatomic. The vertebral body heights are maintained. There is no acute fracture. The prevertebral soft tissues are normal. The intraspinal soft tissues are not fully imaged on this examination due to poor soft tissue contrast, but there is no gross soft tissue abnormality. 4 mm anterolisthesis of C2 on C3 secondary to severe bilateral facet arthropathy. Anterior ankylosis at C3-4, C4-5 and C5-6. Ankylosis of the posterior elements of C4-5. Ankylosis of the right posterior elements at C3-4 and C4-5. Broad-based disc osteophyte complex at C4-5 and C5-6. Degenerative disc disease with disc height loss at C6-7. 5 mm of anterolisthesis of C6 on C7 secondary to severe bilateral facet arthropathy. Degenerative disc disease  with disc height loss at T1-2  and T2-3 with bilateral facet arthropathy. There is biapical fibrosis. IMPRESSION: 1. No acute intracranial pathology. 2. No acute osseous injury of the cervical spine. 3. Cervical spine spondylosis as described above. Electronically Signed   By: Kathreen Devoid   On: 02/10/2016 20:16   Ct Cervical Spine Wo Contrast  02/10/2016  CLINICAL DATA:  Status post fall.  Hit head. EXAM: CT HEAD WITHOUT CONTRAST CT CERVICAL SPINE WITHOUT CONTRAST TECHNIQUE: Multidetector CT imaging of the head and cervical spine was performed following the standard protocol without intravenous contrast. Multiplanar CT image reconstructions of the cervical spine were also generated. COMPARISON:  03/05/2015 FINDINGS: CT HEAD FINDINGS There is no evidence of mass effect, midline shift, or extra-axial fluid collections. There is no evidence of a space-occupying lesion or intracranial hemorrhage. There is no evidence of a cortical-based area of acute infarction. There is generalized cerebral atrophy. There is periventricular white matter low attenuation likely secondary to microangiopathy. The ventricles and sulci are appropriate for the patient's age. The basal cisterns are patent. Visualized portions of the orbits are unremarkable. The visualized portions of the paranasal sinuses and mastoid air cells are unremarkable. Cerebrovascular atherosclerotic calcifications are noted. The osseous structures are unremarkable. CT CERVICAL SPINE FINDINGS The alignment is anatomic. The vertebral body heights are maintained. There is no acute fracture. The prevertebral soft tissues are normal. The intraspinal soft tissues are not fully imaged on this examination due to poor soft tissue contrast, but there is no gross soft tissue abnormality. 4 mm anterolisthesis of C2 on C3 secondary to severe bilateral facet arthropathy. Anterior ankylosis at C3-4, C4-5 and C5-6. Ankylosis of the posterior elements of C4-5. Ankylosis of the  right posterior elements at C3-4 and C4-5. Broad-based disc osteophyte complex at C4-5 and C5-6. Degenerative disc disease with disc height loss at C6-7. 5 mm of anterolisthesis of C6 on C7 secondary to severe bilateral facet arthropathy. Degenerative disc disease with disc height loss at T1-2 and T2-3 with bilateral facet arthropathy. There is biapical fibrosis. IMPRESSION: 1. No acute intracranial pathology. 2. No acute osseous injury of the cervical spine. 3. Cervical spine spondylosis as described above. Electronically Signed   By: Kathreen Devoid   On: 02/10/2016 20:16   Dg Hand Complete Right  02/10/2016  CLINICAL DATA:  Right hand pain after fall at home today. Bruising in the fourth finger. EXAM: RIGHT HAND - COMPLETE 3+ VIEW COMPARISON:  None. FINDINGS: Diffuse bone demineralization. Degenerative changes throughout the interphalangeal joints, metacarpal phalangeal joints, carpometacarpal, carpal, and radiocarpal joints. Deformity of the distal interphalangeal joint of the fifth finger may indicate ligamentous injury or laxity. No acute fracture or dislocation otherwise identified. IMPRESSION: Prominent degenerative changes throughout the right hand and wrist. No acute fracture or dislocation is suggested. Deformity of the fifth finger likely represents old ligamentous injury. Electronically Signed   By: Lucienne Capers M.D.   On: 02/10/2016 21:26   Dg Hip Unilat With Pelvis 2-3 Views Left  02/10/2016  CLINICAL DATA:  Left hip pain after falling in the bathroom at home today. EXAM: DG HIP (WITH OR WITHOUT PELVIS) 2-3V LEFT COMPARISON:  None. FINDINGS: Bilateral total hip prostheses. There is a small, linear avulsion fracture involving the lateral aspect of the proximal femoral shaft. Moderate levoconvex lumbar rotary scoliosis. IMPRESSION: Small, linear avulsion fracture involving the lateral aspect of the proximal femoral shaft. This could be acute or old. Electronically Signed   By: Claudie Revering M.D.    On: 02/10/2016 19:41   Dg  Femur 1v Left  02/10/2016  CLINICAL DATA:  Possible left femur fracture by prior plain films. EXAM: SINGLE VIEW OF THE LEFT FEMUR COMPARISON:  Single view of the left femur this same day. FINDINGS: Left hip replacement is again seen. There is a small linear focus of cortical thickening along the proximal diaphysis left femur. No fractures identified. IMPRESSION: No fracture is seen. Small focus of cortical thickening along the proximal diaphysis of the left femur will appears chronic and may be due to remote stress change. Electronically Signed   By: Inge Rise M.D.   On: 02/10/2016 21:25    Labs:  Recent Labs  02/10/16 1947  WBC 10.7*  RBC 4.05  HCT 34.8*  PLT 285    Recent Labs  02/10/16 1947  NA 138  K 4.3  CL 105  CO2 25  BUN 27*  CREATININE 1.05*  GLUCOSE 107*  CALCIUM 9.1   No results for input(s): LABPT, INR in the last 72 hours.  Review of Systems: ROS  Physical Exam: Neurologically intact ABD soft Sensation intact distally Intact pulses distally Dorsiflexion/Plantar flexion intact Compartment soft  Assessment and Plan: CT has been ordered to to r/o femoral fracture Pt will be admitted by medicine to the floor Pt will be NPO after midnight [name] (on call) consulted Dr. Lyla Glassing in case surgical intervention is needed.  If there is no fracture - consider PT consult in the am   Ronette Deter, PA-C for [employer], [place] [phone]

## 2016-02-10 NOTE — Progress Notes (Signed)
CSW met with patient at bedside. Patient was alert and oriented, but at the time was hard of hearing. Daughter was present.  Patient confirms that she presents to [facility] due to fall. Patient reports that she lives home with her daughter in [place]. Daughter states that she assist pt with completing her ADL's. Daughter informed CSW that she is interested in private duty nursing.  Daughter informed CSW that pt ambulates with a walker at all times, and if not a walker she uses a wheel chair.   CSW provided pt with a list of SNF. Also, CSW messaged Nurse CM to speak with daughter and pt about private duty nursing.  Willette Brace [phone] ED CSW 02/10/2016 8:18 PM

## 2016-02-11 MED ORDER — OXYCODONE-ACETAMINOPHEN 5-325 MG PO TABS: 1.0000 | ORAL_TABLET | Freq: Four times a day (QID) | ORAL | Status: DC | PRN

## 2016-02-11 NOTE — ED Notes (Signed)
Pt ambulated with 2 person assist, pt took 4 steps, pt could not put much pressure on her left leg, pt very short of breath after. Oxygen was 85% when pt returned to bed. Dr. Rex Kras notified of pt's condition.

## 2016-02-11 NOTE — ED Notes (Signed)
20 g sl removed from LAC, catheter intact, gauze dressing applied.

## 2016-02-13 ENCOUNTER — Telehealth: Payer: Self-pay

## 2016-02-13 ENCOUNTER — Encounter: Payer: Self-pay | Admitting: Nurse Practitioner

## 2016-02-13 ENCOUNTER — Ambulatory Visit
Admission: RE | Admit: 2016-02-13 | Discharge: 2016-02-13 | Disposition: A | Discharge status: Home / Self Care | Payer: PPO | Source: Ambulatory Visit | Attending: Nurse Practitioner | Admitting: Nurse Practitioner

## 2016-02-13 ENCOUNTER — Other Ambulatory Visit: Payer: Self-pay | Admitting: *Deleted

## 2016-02-13 ENCOUNTER — Ambulatory Visit (INDEPENDENT_AMBULATORY_CARE_PROVIDER_SITE_OTHER): Payer: PPO | Admitting: Nurse Practitioner

## 2016-02-13 ENCOUNTER — Other Ambulatory Visit: Payer: Self-pay

## 2016-02-13 VITALS — BP 118/64 | HR 77 | Temp 98.0°F | Resp 17

## 2016-02-13 DIAGNOSIS — R5381 Other malaise: Secondary | ICD-10-CM | POA: Diagnosis not present

## 2016-02-13 DIAGNOSIS — M25551 Pain in right hip: Secondary | ICD-10-CM

## 2016-02-13 DIAGNOSIS — W19XXXD Unspecified fall, subsequent encounter: Secondary | ICD-10-CM | POA: Diagnosis not present

## 2016-02-13 DIAGNOSIS — K5900 Constipation, unspecified: Secondary | ICD-10-CM | POA: Diagnosis not present

## 2016-02-13 DIAGNOSIS — S79911A Unspecified injury of right hip, initial encounter: Secondary | ICD-10-CM | POA: Diagnosis not present

## 2016-02-13 MED ORDER — OXYCODONE-ACETAMINOPHEN 5-325 MG PO TABS: 1.0000 | ORAL_TABLET | Freq: Four times a day (QID) | ORAL | Status: DC | PRN

## 2016-02-13 NOTE — Telephone Encounter (Signed)
Patient's daughter called to ask for a refill on patient's pain medication. Patient was seen in office today for fall. [name] stated to give a Rx for #240 No RF. Take 1-2 tablets by mouth every 6 hours as needed for severe pain.   Rx printed but then I had to reprint it on blue paper. [name] signed it and it was placed in front filing cabinet.

## 2016-02-13 NOTE — Patient Outreach (Signed)
Referral received from hospital liaison due to member presenting to ED with fall on Monday, 7/10.  According to chart she has history of cancer with mets and rheumatoid arthritis.  Call placed to member/daughter to introduce [facility] care management services, daughter states that they are in the process of leaving the house to attend PCP visit.  Requests call back later this afternoon or tomorrow.  Will make another attempt to introduce services tomorrow.  Valente David, [place], MSN [place] [phone]

## 2016-02-13 NOTE — Telephone Encounter (Signed)
Message left on triage voicemail. Patient in extreme pain. Patient's daughter would like xray results and pain medication  Please advise

## 2016-02-13 NOTE — Progress Notes (Signed)
Patient ID: Patty Walters, female   DOB: 1921/10/01, 82 y.o.   MRN: [id number]    PCP: [name], NP  Advanced Directive information Does patient have an advance directive?: Yes, Type of Advance Directive: [place], Does patient want to make changes to advanced directive?: No - Patient declined  No Known Allergies  Chief Complaint  Patient presents with  . Medical Management of Chronic Issues    Pt fell on Monday and was transported to ED by EMS. Intitally had left hip pain (scans done) now right hip is hurting alot. Daughter wants xray of right hip.  . OTHER    Daughter in room.     HPI: Patient is a 82 y.o. female seen in the office today to follow up fall. Pt went to ED after fall. The patient reported a sore and bruised 4th metacarpal on her right hand, sore left hip/leg and a contusion of her right anterior distal lower extremity.pt was worked up for left hip pain which was negative. Went home. Walking with walker doing okay. Got up and started complaining of severe pain on the right side several days after fall.   Did not get right hip/leg imagined  Also worried about constipation Had a small BM Tuesday but none since, has hemorrhoid as well. Taking colace daily  Has miralax PRN and MOM PRN  Daughter reports in the morning she does not want to move, very tearful and complaining of pain. Since she has been in the office reports she is not having pain.   Review of Systems:  Review of Systems  Constitutional: Negative for fever and chills.  HENT: Positive for hearing loss. Negative for congestion.   Eyes: Negative for pain.  Respiratory: Negative for cough, shortness of breath and wheezing.   Cardiovascular: Positive for leg swelling (right leg, chronic). Negative for chest pain and palpitations.  Gastrointestinal: Negative for abdominal pain, constipation and blood in stool.  Genitourinary: Negative for dysuria.  Musculoskeletal: Positive for  back pain, arthralgias and gait problem.  Neurological: Positive for weakness. Negative for dizziness and headaches.  Hematological: Bruises/bleeds easily.    Past Medical History  Diagnosis Date  . Rheumatoid arthritis(714.0)   . Allergy   . Cancer ([place])   . Blood transfusion without reported diagnosis   . Anemia   . Cataract   . UTI (urinary tract infection)   . Melanoma [facility])    Past Surgical History  Procedure Laterality Date  . Joint replacement    . Mastectomy Left   . Hip replacement Bilateral   . Appendectomy    . Fracture surgery    . Breast surgery Left 2010    ? breast cancer   Social History:   reports that she has never smoked. She has never used smokeless tobacco. She reports that she drinks about 0.6 oz of alcohol per week. She reports that she does not use illicit drugs.  Family History  Problem Relation Age of Onset  . Emphysema Father     smoked and was exposed to mustard gas  . COPD Father   . Cancer Sister     esophageal  . Hypertension Daughter   . Hypertension Son   . Cancer Brother   . Cancer Sister     Medications: Patient's Medications  New Prescriptions   No medications on file  Previous Medications   BARRIER CREAM (NON-SPECIFIED) CREA    Apply 1 application topically 2 (two) times daily as needed (for  bedsores).   [employer] PAIN RELIEF EXTRA STRENGTH 500 MG TABLET    Take 1,000 mg by mouth every 6 (six) hours as needed. for pain   DOCUSATE SODIUM (COLACE) 100 MG CAPSULE    Take 100 mg by mouth daily.   DULOXETINE (CYMBALTA) 60 MG CAPSULE    TAKE 1 CAPSULE (60 MG TOTAL) BY MOUTH DAILY.   FUROSEMIDE (LASIX) 20 MG TABLET    Take 1 tablet (20 mg total) by mouth daily as needed for edema.   HYDROXYCHLOROQUINE (PLAQUENIL) 200 MG TABLET    TAKE 1 TABLET (200 MG TOTAL) BY MOUTH DAILY.   LIDOCAINE-PRILOCAINE (EMLA) CREAM    Apply 1 application topically as directed. Apply to lesions undergoing injection 30 mins prior to treatment   MAGNESIUM HYDROXIDE  (MILK OF MAGNESIA) 800 MG/5ML SUSPENSION    Take 15 mLs by mouth daily as needed for constipation.   MULTIPLE VITAMIN (MULTIVITAMIN) TABLET    Take 1 tablet by mouth daily.   NYSTATIN (MYCOSTATIN/NYSTOP) 100000 UNIT/GM POWD    Twice daily as needed to affected area   OXYCODONE-ACETAMINOPHEN (PERCOCET) 5-325 MG TABLET    Take 1-2 tablets by mouth every 6 (six) hours as needed for severe pain.   POLYETHYLENE GLYCOL (MIRALAX / GLYCOLAX) PACKET    Take 17 g by mouth daily as needed for mild constipation or moderate constipation.    PROCHLORPERAZINE (COMPAZINE) 10 MG TABLET    Take 10 mg by mouth every 6 (six) hours as needed for nausea or vomiting.   SENNA (SENOKOT) 8.6 MG TABLET    Take 2 tablets by mouth at bedtime as needed for constipation.    SKIN PROTECTANTS, MISC. (CALAZIME SKIN PROTECTANT EX)    Apply topically daily. After shower to prevent bed sores   TALIMOGENE LAHERPAREPVEC (IMLYGIC) 1000000 UNIT/ML SUSP    by Intralesional route. 1 treatment every 2 week at [facility] Medications   No medications on file  Discontinued Medications   No medications on file     Physical Exam:  Filed Vitals:   02/13/16 1046  BP: 118/64  Pulse: 77  Temp: 98 F (36.7 C)  TempSrc: Oral  Resp: 17  SpO2: 96%   There is no weight on file to calculate BMI.  Physical Exam  Constitutional: She is oriented to person, place, and time. She appears well-developed and well-nourished. No distress.  HENT:  Head: Normocephalic and atraumatic.  Mouth/Throat: Oropharynx is clear and moist. No oropharyngeal exudate.  Eyes: Conjunctivae are normal. Pupils are equal, round, and reactive to light.  Neck: Normal range of motion. Neck supple.  Cardiovascular: Normal rate, regular rhythm and normal heart sounds.   Occasional irregular beat  Pulmonary/Chest: Effort normal and breath sounds normal. No respiratory distress.  Abdominal: Soft. Bowel sounds are normal.  Musculoskeletal: She exhibits edema (2+  chronic to right lower leg) and tenderness.       Right hip: She exhibits decreased range of motion and decreased strength.       Left hip: She exhibits decreased range of motion and decreased strength.  Neurological: She is alert and oriented to person, place, and time.  Skin: Skin is warm and dry. She is not diaphoretic.  Discoloration to forehead from skin removal, lesion healed   Psychiatric: She has a normal mood and affect.    Labs reviewed: Basic Metabolic Panel:  Recent Labs  06/03/15 1904 12/16/15 1452 02/10/16 1947  NA 141 139 138  K 4.0 4.7 4.3  CL 111 102  105  CO2 22 23 25   GLUCOSE 113* 94 107*  BUN 23* 25 27*  CREATININE 1.03* 1.03* 1.05*  CALCIUM 9.1 8.9 9.1   Liver Function Tests:  Recent Labs  06/03/15 1904 12/16/15 1452  AST 30 16  ALT 23 15  ALKPHOS 65 70  BILITOT 0.4 0.2  PROT 7.5 6.6  ALBUMIN 3.8 3.9   No results for input(s): LIPASE, AMYLASE in the last 8760 hours. No results for input(s): AMMONIA in the last 8760 hours. CBC:  Recent Labs  06/03/15 1904 07/23/15 1545 12/16/15 1452 02/10/16 1947  WBC 7.8 6.5 7.0 10.7*  NEUTROABS  --  4.4 5.1 9.0*  HGB 9.4*  --   --  11.5*  HCT 29.2* 30.5* 30.8* 34.8*  MCV 84.9 82 84 85.9  PLT 272 306 299 285   Lipid Panel: No results for input(s): CHOL, HDL, LDLCALC, TRIG, CHOLHDL, LDLDIRECT in the last 8760 hours. TSH: No results for input(s): TSH in the last 8760 hours. A1C: No results found for: HGBA1C   Assessment/Plan 1. Right hip pain after Fall, subsequent encounter -now with increased pain to right hip, hx of RA and daughter questions exacerbation of pain with this vs fracture from fall. Will get xray of right hip -also due to fall very guarded, will get [facility] PT/OT for evaluation and treatment to help with pain and gait training  - DG HIP UNILAT WITH PELVIS 2-3 VIEWS RIGHT; Future - Ambulatory referral to Home Health  2. Constipation, unspecified constipation type -to increase  hydration, to use miralax daily if diarrhea occurs decrease to twice daily   3.  Debility - Ambulatory referral to [facility] to help with gait and strength training.   Carlos American. Harle Battiest  [facility] [phone] 8 am - 5 pm) [phone] (after hours)

## 2016-02-13 NOTE — Patient Instructions (Signed)
Order placed for xray of right hip Continue pain medication as needed 1-2 tablets every 6 hours Start miralax daily due to constipation Increase water intake  Order placed for home health PT

## 2016-02-14 ENCOUNTER — Telehealth: Payer: Self-pay | Admitting: General Practice

## 2016-02-14 ENCOUNTER — Telehealth: Payer: Self-pay | Admitting: *Deleted

## 2016-02-14 ENCOUNTER — Other Ambulatory Visit: Payer: Self-pay | Admitting: *Deleted

## 2016-02-14 DIAGNOSIS — Z9181 History of falling: Secondary | ICD-10-CM | POA: Diagnosis not present

## 2016-02-14 DIAGNOSIS — M6281 Muscle weakness (generalized): Secondary | ICD-10-CM | POA: Diagnosis not present

## 2016-02-14 DIAGNOSIS — Z96643 Presence of artificial hip joint, bilateral: Secondary | ICD-10-CM | POA: Diagnosis not present

## 2016-02-14 DIAGNOSIS — Z9012 Acquired absence of left breast and nipple: Secondary | ICD-10-CM | POA: Diagnosis not present

## 2016-02-14 DIAGNOSIS — M25551 Pain in right hip: Secondary | ICD-10-CM | POA: Diagnosis not present

## 2016-02-14 DIAGNOSIS — M069 Rheumatoid arthritis, unspecified: Secondary | ICD-10-CM | POA: Diagnosis not present

## 2016-02-14 DIAGNOSIS — C439 Malignant melanoma of skin, unspecified: Secondary | ICD-10-CM | POA: Diagnosis not present

## 2016-02-14 NOTE — Telephone Encounter (Signed)
Left message on voicemail for Patty Walters with results below. I instructed Patty Walters to call back if she has questions or concerns.

## 2016-02-14 NOTE — Patient Outreach (Addendum)
Call placed to [facility] daughter, [name], as requested.  She state that the member is doing "okay" but state that this has been a rough week since the member's fall.  [name] state that the member will have an evaluation from physical therapy today, who the daughter is hoping will offer recommendations for additional equipment that is needed for the member.  She state that she has already received multiple resources from the Education officer, museum and case manager in the hospital for personal care assistants/sitters.  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    [name] reports that her sister is a Designer, jewellery in [place] and is available to provide support over the phone whenever needed.  This care manager discussed the option of day services for the member.  Daughter state that this is something that she has thought about, but have not mentioned it to member.  She will consider this and other options and discuss with member.   Offered to place referral to Education officer, museum for additional resources.  Daughter feels that she has enough resources to think about at this time.  She is advised that this care manager will provide follow up call within the next 2 weeks.  If no additional needs are expressed, will close case at that time.  Member was not admitted, only stayed over night in the Emergency Department.  Will not open transition of care program at this time.  Valente David, [place], MSN [place] [phone]

## 2016-02-14 NOTE — Telephone Encounter (Signed)
Right hip xray neg for fx. She was given percocet Rx yesterday which she may take for pain. She was also referred to [facility] PT. No further recommendations at this time. If pain becomes too severe, she may go to the ED

## 2016-02-14 NOTE — Telephone Encounter (Signed)
Will forward to office provider to address, message not addressed yesterday

## 2016-02-14 NOTE — Telephone Encounter (Signed)
Pete with [facility] called and stated that he saw patient today and she could benefit from a Rx for a Wheelchair and Hospital Bed due to pain. Laurey Arrow will fax over an order for it.

## 2016-02-17 ENCOUNTER — Telehealth: Payer: Self-pay | Admitting: *Deleted

## 2016-02-17 DIAGNOSIS — M25551 Pain in right hip: Secondary | ICD-10-CM | POA: Diagnosis not present

## 2016-02-17 DIAGNOSIS — M069 Rheumatoid arthritis, unspecified: Secondary | ICD-10-CM | POA: Diagnosis not present

## 2016-02-17 DIAGNOSIS — Z96643 Presence of artificial hip joint, bilateral: Secondary | ICD-10-CM | POA: Diagnosis not present

## 2016-02-17 DIAGNOSIS — M6281 Muscle weakness (generalized): Secondary | ICD-10-CM | POA: Diagnosis not present

## 2016-02-17 DIAGNOSIS — C439 Malignant melanoma of skin, unspecified: Secondary | ICD-10-CM | POA: Diagnosis not present

## 2016-02-17 DIAGNOSIS — Z9012 Acquired absence of left breast and nipple: Secondary | ICD-10-CM | POA: Diagnosis not present

## 2016-02-17 DIAGNOSIS — Z9181 History of falling: Secondary | ICD-10-CM | POA: Diagnosis not present

## 2016-02-17 NOTE — Telephone Encounter (Signed)
Okay to start OxyContin 10 mg every 12 hours then she can use the percocet for breakthrough pain

## 2016-02-17 NOTE — Telephone Encounter (Signed)
Pete with [facility] called and stated that patient was in a lot of pain. Stated that her pain is 7-8/10. Has been icing on and off every couple of hours. Has had 6 tablets already today. Can something be prescribed for breakthrough pain. Has been going every 4-4 1/2 hours needing pain medication including the icing. Thinking its bad bruised, x-ray showed no fractures. Hard to get down to be seen due to living on 2nd floor and the pain. Please Advise.

## 2016-02-18 ENCOUNTER — Telehealth: Payer: Self-pay | Admitting: Adult Health

## 2016-02-18 ENCOUNTER — Encounter: Payer: Self-pay | Admitting: Adult Health

## 2016-02-18 ENCOUNTER — Telehealth: Payer: Self-pay | Admitting: *Deleted

## 2016-02-18 MED ORDER — OXYCODONE HCL ER 10 MG PO T12A: EXTENDED_RELEASE_TABLET | ORAL | Status: DC

## 2016-02-18 NOTE — Telephone Encounter (Signed)
Patient daughter, [name] notified and agreed. Rx printed.

## 2016-02-18 NOTE — Telephone Encounter (Signed)
Patient daughter called and stated that the Oxycontin is non formulary and the pharmacy will not fill.  I called the pharmacy, [facility] and obtained the # to Blase Mess [phone] and Member ID # [id number] I called and spoke with Marya Amsler at [facility] and he is to fax me a prior authorization form to be completed and faxed back.

## 2016-02-18 NOTE — Telephone Encounter (Signed)
I received a call from the pt's daughter while on call for the practice. She stated she went to pick up the oxycontin prescribed but it was not ready due to the fact that the prior auth had not been filled out yet as it came at the end of the day.  She called me around 510pm.  She is concerned about her mother's pain and states its an 8/10 to the right hip.  I let her know that the oxycontin does not work as quickly and would certainly help but not in rapid fashion as the percocet does.  The pain has been present for several days and her xray showed no fracture after fall with subsequent bruising of the right hip. I called the pharmacy and they stated the cost would be $240 out of pocket unless prior auth form is returned, which will be done tomorrow. In the mean time she can pay out of pocket if possible.  I let her know that if the pain is not relieved or worsens she will need to go to the ER.  I told her Patty Walters can take up to 9 percocet a day to meet the 3000 mg limit on the apap in the percocet but not to exceed this amount. Her daughter verbalized an understanding of this.

## 2016-02-19 NOTE — Telephone Encounter (Signed)
Received fax from [facility] [phone] and Oxycontin 10mg  was APPROVED from 02/19/2016-08/02/2016 Daughter notified.

## 2016-02-20 ENCOUNTER — Emergency Department (HOSPITAL_COMMUNITY): Payer: PPO

## 2016-02-20 ENCOUNTER — Observation Stay (HOSPITAL_COMMUNITY)
Admission: EM | Admit: 2016-02-20 | Discharge: 2016-02-23 | Disposition: A | Discharge status: Skilled Nursing Facility | Payer: PPO | Attending: Internal Medicine | Admitting: Internal Medicine

## 2016-02-20 ENCOUNTER — Encounter (HOSPITAL_COMMUNITY): Payer: Self-pay | Admitting: Emergency Medicine

## 2016-02-20 ENCOUNTER — Observation Stay (HOSPITAL_COMMUNITY): Payer: PPO

## 2016-02-20 DIAGNOSIS — S32049D Unspecified fracture of fourth lumbar vertebra, subsequent encounter for fracture with routine healing: Secondary | ICD-10-CM

## 2016-02-20 DIAGNOSIS — W19XXXA Unspecified fall, initial encounter: Secondary | ICD-10-CM | POA: Diagnosis not present

## 2016-02-20 DIAGNOSIS — S32049A Unspecified fracture of fourth lumbar vertebra, initial encounter for closed fracture: Secondary | ICD-10-CM | POA: Diagnosis not present

## 2016-02-20 DIAGNOSIS — Z87440 Personal history of urinary (tract) infections: Secondary | ICD-10-CM | POA: Diagnosis not present

## 2016-02-20 DIAGNOSIS — S80811A Abrasion, right lower leg, initial encounter: Secondary | ICD-10-CM | POA: Insufficient documentation

## 2016-02-20 DIAGNOSIS — K5900 Constipation, unspecified: Secondary | ICD-10-CM | POA: Diagnosis not present

## 2016-02-20 DIAGNOSIS — C50912 Malignant neoplasm of unspecified site of left female breast: Secondary | ICD-10-CM | POA: Diagnosis not present

## 2016-02-20 DIAGNOSIS — R0902 Hypoxemia: Secondary | ICD-10-CM | POA: Diagnosis not present

## 2016-02-20 DIAGNOSIS — N183 Chronic kidney disease, stage 3 (moderate): Secondary | ICD-10-CM | POA: Insufficient documentation

## 2016-02-20 DIAGNOSIS — S32048A Other fracture of fourth lumbar vertebra, initial encounter for closed fracture: Secondary | ICD-10-CM

## 2016-02-20 DIAGNOSIS — M069 Rheumatoid arthritis, unspecified: Secondary | ICD-10-CM | POA: Diagnosis present

## 2016-02-20 DIAGNOSIS — Z96643 Presence of artificial hip joint, bilateral: Secondary | ICD-10-CM | POA: Diagnosis not present

## 2016-02-20 DIAGNOSIS — R609 Edema, unspecified: Secondary | ICD-10-CM | POA: Diagnosis present

## 2016-02-20 DIAGNOSIS — R600 Localized edema: Secondary | ICD-10-CM | POA: Diagnosis not present

## 2016-02-20 DIAGNOSIS — R918 Other nonspecific abnormal finding of lung field: Secondary | ICD-10-CM | POA: Diagnosis not present

## 2016-02-20 DIAGNOSIS — Z79899 Other long term (current) drug therapy: Secondary | ICD-10-CM | POA: Diagnosis not present

## 2016-02-20 DIAGNOSIS — D649 Anemia, unspecified: Secondary | ICD-10-CM | POA: Diagnosis not present

## 2016-02-20 DIAGNOSIS — R9389 Abnormal findings on diagnostic imaging of other specified body structures: Secondary | ICD-10-CM

## 2016-02-20 DIAGNOSIS — Z9012 Acquired absence of left breast and nipple: Secondary | ICD-10-CM | POA: Diagnosis not present

## 2016-02-20 DIAGNOSIS — Z85820 Personal history of malignant melanoma of skin: Secondary | ICD-10-CM | POA: Insufficient documentation

## 2016-02-20 DIAGNOSIS — M25551 Pain in right hip: Secondary | ICD-10-CM | POA: Diagnosis not present

## 2016-02-20 DIAGNOSIS — I129 Hypertensive chronic kidney disease with stage 1 through stage 4 chronic kidney disease, or unspecified chronic kidney disease: Secondary | ICD-10-CM | POA: Diagnosis not present

## 2016-02-20 DIAGNOSIS — Z66 Do not resuscitate: Secondary | ICD-10-CM | POA: Insufficient documentation

## 2016-02-20 DIAGNOSIS — J40 Bronchitis, not specified as acute or chronic: Secondary | ICD-10-CM | POA: Diagnosis not present

## 2016-02-20 DIAGNOSIS — S32009A Unspecified fracture of unspecified lumbar vertebra, initial encounter for closed fracture: Secondary | ICD-10-CM | POA: Diagnosis present

## 2016-02-20 LAB — FOLATE: FOLATE: 45.2 ng/mL (ref >5.9)

## 2016-02-20 LAB — CBC WITH DIFFERENTIAL/PLATELET
BASOS ABS: 0.1 10*3/uL (ref 0.0–0.1)
Basophils Relative: 1 %
Eosinophils Absolute: 0.3 10*3/uL (ref 0.0–0.7)
Eosinophils Relative: 3 %
HEMATOCRIT: 24.4 % — AB (ref 36.0–46.0)
Hemoglobin: 7.7 g/dL — ABNORMAL LOW (ref 12.0–15.0)
LYMPHS ABS: 1.5 10*3/uL (ref 0.7–4.0)
LYMPHS PCT: 18 %
MCH: 28.2 pg (ref 26.0–34.0)
MCHC: 31.6 g/dL (ref 30.0–36.0)
MCV: 89.4 fL (ref 78.0–100.0)
MONO ABS: 0.7 10*3/uL (ref 0.1–1.0)
MONOS PCT: 8 %
NEUTROS ABS: 6.1 10*3/uL (ref 1.7–7.7)
Neutrophils Relative %: 70 %
Platelets: 400 10*3/uL (ref 150–400)
RBC: 2.73 MIL/uL — ABNORMAL LOW (ref 3.87–5.11)
RDW: 14.3 % (ref 11.5–15.5)
WBC: 8.7 10*3/uL (ref 4.0–10.5)

## 2016-02-20 LAB — BASIC METABOLIC PANEL
ANION GAP: 8 (ref 5–15)
BUN: 26 mg/dL — ABNORMAL HIGH (ref 6–20)
CHLORIDE: 101 mmol/L (ref 101–111)
CO2: 26 mmol/L (ref 22–32)
Calcium: 8.4 mg/dL — ABNORMAL LOW (ref 8.9–10.3)
Creatinine, Ser: 1.2 mg/dL — ABNORMAL HIGH (ref 0.44–1.00)
GFR calc Af Amer: 44 mL/min — ABNORMAL LOW (ref >60)
GFR calc non Af Amer: 38 mL/min — ABNORMAL LOW (ref >60)
GLUCOSE: 92 mg/dL (ref 65–99)
POTASSIUM: 5 mmol/L (ref 3.5–5.1)
Sodium: 135 mmol/L (ref 135–145)

## 2016-02-20 LAB — URINALYSIS, ROUTINE W REFLEX MICROSCOPIC
BILIRUBIN URINE: NEGATIVE
Glucose, UA: NEGATIVE mg/dL
Ketones, ur: NEGATIVE mg/dL
NITRITE: NEGATIVE
PH: 7 (ref 5.0–8.0)
Protein, ur: NEGATIVE mg/dL
SPECIFIC GRAVITY, URINE: 1.01 (ref 1.005–1.030)

## 2016-02-20 LAB — PROTIME-INR
INR: 1.06 (ref 0.00–1.49)
Prothrombin Time: 14 seconds (ref 11.6–15.2)

## 2016-02-20 LAB — URINE MICROSCOPIC-ADD ON

## 2016-02-20 LAB — FERRITIN: Ferritin: 57 ng/mL (ref 11–307)

## 2016-02-20 LAB — IRON AND TIBC
IRON: 29 ug/dL (ref 28–170)
Saturation Ratios: 9 % — ABNORMAL LOW (ref 10.4–31.8)
TIBC: 323 ug/dL (ref 250–450)
UIBC: 294 ug/dL

## 2016-02-20 LAB — POC OCCULT BLOOD, ED: Fecal Occult Bld: POSITIVE — AB

## 2016-02-20 LAB — RETICULOCYTES
RBC.: 3.56 MIL/uL — ABNORMAL LOW (ref 3.87–5.11)
Retic Count, Absolute: 46.3 10*3/uL (ref 19.0–186.0)
Retic Ct Pct: 1.3 % (ref 0.4–3.1)

## 2016-02-20 LAB — TSH: TSH: 1.511 u[IU]/mL (ref 0.350–4.500)

## 2016-02-20 LAB — BRAIN NATRIURETIC PEPTIDE: B NATRIURETIC PEPTIDE 5: 153.9 pg/mL — AB (ref 0.0–100.0)

## 2016-02-20 LAB — VITAMIN B12: VITAMIN B 12: 1066 pg/mL — AB (ref 180–914)

## 2016-02-20 MED ORDER — ACETAMINOPHEN 325 MG PO TABS
650.0000 mg | ORAL_TABLET | Freq: Four times a day (QID) | ORAL | Status: DC | PRN
  Filled 2016-02-20: qty 2

## 2016-02-20 MED ORDER — HYDROXYCHLOROQUINE SULFATE 200 MG PO TABS
200.0000 mg | ORAL_TABLET | Freq: Every day | ORAL | Status: DC
  Administered 2016-02-20 – 2016-02-23 (×4): 200 mg via ORAL
  Filled 2016-02-20 (×4): qty 1

## 2016-02-20 MED ORDER — SENNA 8.6 MG PO TABS
2.0000 | ORAL_TABLET | Freq: Every day | ORAL | Status: DC
  Administered 2016-02-20 – 2016-02-23 (×3): 17.2 mg via ORAL
  Filled 2016-02-20 (×3): qty 2

## 2016-02-20 MED ORDER — ADULT MULTIVITAMIN W/MINERALS CH
1.0000 | ORAL_TABLET | Freq: Every day | ORAL | Status: DC
  Administered 2016-02-20 – 2016-02-23 (×4): 1 via ORAL
  Filled 2016-02-20 (×4): qty 1

## 2016-02-20 MED ORDER — ONDANSETRON HCL 4 MG PO TABS: 4.0000 mg | ORAL_TABLET | Freq: Four times a day (QID) | ORAL | Status: DC | PRN

## 2016-02-20 MED ORDER — OXYCODONE-ACETAMINOPHEN 5-325 MG PO TABS
1.0000 | ORAL_TABLET | Freq: Four times a day (QID) | ORAL | Status: DC | PRN
  Administered 2016-02-20: 2 via ORAL
  Administered 2016-02-20 (×2): 1 via ORAL
  Administered 2016-02-21 – 2016-02-23 (×7): 2 via ORAL
  Filled 2016-02-20 (×5): qty 2
  Filled 2016-02-20: qty 1
  Filled 2016-02-20 (×2): qty 2
  Filled 2016-02-20: qty 1
  Filled 2016-02-20: qty 2

## 2016-02-20 MED ORDER — POLYETHYLENE GLYCOL 3350 17 G PO PACK
17.0000 g | PACK | Freq: Every day | ORAL | Status: DC
  Administered 2016-02-20 – 2016-02-23 (×2): 17 g via ORAL
  Filled 2016-02-20 (×4): qty 1

## 2016-02-20 MED ORDER — PROCHLORPERAZINE MALEATE 10 MG PO TABS
10.0000 mg | ORAL_TABLET | Freq: Four times a day (QID) | ORAL | Status: DC | PRN
  Filled 2016-02-20: qty 1

## 2016-02-20 MED ORDER — FUROSEMIDE 20 MG PO TABS: 20.0000 mg | ORAL_TABLET | Freq: Every day | ORAL | Status: DC | PRN

## 2016-02-20 MED ORDER — MORPHINE SULFATE (PF) 2 MG/ML IV SOLN
1.0000 mg | INTRAVENOUS | Status: DC | PRN
  Administered 2016-02-20 – 2016-02-22 (×4): 1 mg via INTRAVENOUS
  Filled 2016-02-20 (×5): qty 1

## 2016-02-20 MED ORDER — MORPHINE SULFATE (PF) 4 MG/ML IV SOLN
3.0000 mg | Freq: Once | INTRAVENOUS | Status: AC
  Administered 2016-02-20: 3 mg via INTRAVENOUS
  Filled 2016-02-20: qty 1

## 2016-02-20 MED ORDER — IOPAMIDOL (ISOVUE-370) INJECTION 76%
100.0000 mL | Freq: Once | INTRAVENOUS | Status: AC | PRN
  Administered 2016-02-20: 80 mL via INTRAVENOUS

## 2016-02-20 MED ORDER — ACETAMINOPHEN 650 MG RE SUPP: 650.0000 mg | Freq: Four times a day (QID) | RECTAL | Status: DC | PRN

## 2016-02-20 MED ORDER — DULOXETINE HCL 30 MG PO CPEP
60.0000 mg | ORAL_CAPSULE | Freq: Every day | ORAL | Status: DC
  Administered 2016-02-20 – 2016-02-23 (×4): 60 mg via ORAL
  Filled 2016-02-20 (×4): qty 2

## 2016-02-20 MED ORDER — MAGNESIUM HYDROXIDE 400 MG/5ML PO SUSP: 15.0000 mL | Freq: Every day | ORAL | Status: DC | PRN

## 2016-02-20 MED ORDER — SODIUM CHLORIDE 0.9 % IV SOLN
INTRAVENOUS | Status: DC
  Administered 2016-02-20: 11:00:00 via INTRAVENOUS

## 2016-02-20 MED ORDER — HEPARIN SODIUM (PORCINE) 5000 UNIT/ML IJ SOLN
5000.0000 [IU] | Freq: Three times a day (TID) | INTRAMUSCULAR | Status: DC
  Administered 2016-02-20 – 2016-02-23 (×8): 5000 [IU] via SUBCUTANEOUS
  Filled 2016-02-20 (×8): qty 1

## 2016-02-20 MED ORDER — BARRIER CREAM NON-SPECIFIED
1.0000 "application " | TOPICAL_CREAM | Freq: Two times a day (BID) | TOPICAL | Status: DC | PRN
  Filled 2016-02-20: qty 1

## 2016-02-20 MED ORDER — ONDANSETRON HCL 4 MG/2ML IJ SOLN: 4.0000 mg | Freq: Four times a day (QID) | INTRAMUSCULAR | Status: DC | PRN

## 2016-02-20 MED ORDER — OXYCODONE HCL ER 10 MG PO T12A
10.0000 mg | EXTENDED_RELEASE_TABLET | Freq: Two times a day (BID) | ORAL | Status: DC
  Administered 2016-02-20: 10 mg via ORAL
  Filled 2016-02-20: qty 1

## 2016-02-20 NOTE — ED Notes (Signed)
Pt comes to ed via ems, c/o right hip pain. Pt had a fall on July 10, came to ED and  Was prescribed pain meds and says the pain med's are not controlling the hi[p pain.  Pain is worst standing and walking, pain is better when seating. Pain score 8 out 10 reported by ems.  Medical hx of bilateral hip replacement. Pt on home lasix.  Allergies no known. V/s on arrival 130/80, pulse 74, 96 room air. Pt needs assistance with walking at at base line.

## 2016-02-20 NOTE — ED Notes (Signed)
Another delay to floor nurse working with another rpatient.

## 2016-02-20 NOTE — Care Management Obs Status (Signed)
[facility] NOTIFICATION   Patient Details  Name: ASIALYNN FILBRUN MRN: [id number] Date of Birth: Dec 07, 1921   Medicare Observation Status Notification Given:  Yes Pt unable to grasp understanding of MOON; daughter, [name], was called and [name]  verbalized understanding and requested copy be left in room.  Copy left in room for family and original given to CMA to be scanned into medical record. Dellie Catholic, RN 02/20/2016, 1:28 PM

## 2016-02-20 NOTE — Progress Notes (Signed)
CSW received consult for SNF placement. CSW confirmed with RNCM, Judson Roch that patient plans to return home with daughter & that wheelchair, hospital bed & home care services through [facility] have been arranged.   No further CSW needs identified - CSW signing off.   [name], [facility] Clinical Social Worker cell #: [phone]

## 2016-02-20 NOTE — ED Provider Notes (Signed)
CSN: [id number]     Arrival date & time 02/20/16  0416 History   First MD Initiated Contact with Patient 02/20/16 [id number]     Chief Complaint  Patient presents with  . Hip Pain    right side      (Consider location/radiation/quality/duration/timing/severity/associated sxs/prior Treatment) HPI Comments: 82 year old female with past medical history outlined below who presents with right hip pain. History obtained from the patient's daughter. Daughter, who lives with the patient, brought her here on 7/10 for a fall. I evaluated the patient at that time and she had complained of LEFT hip pain for which workup including CT was negative. She was able to ambulate with assistance and was discharged home. Daughter reports that she initially did okay but a few days later began complaining of RIGHT hip pain. XR of hip was negative at PCP office. She was started on Percocet and daughter has increased without improvement, up to 2 tablets every 4-6 hours. The patient was set up with his therapy and touch base again with PCP who added OxyContin yesterday. Daughter states that she has had excruciating pain particularly when standing up and sitting down from the commode. Last night, she gave pt a dose of oxycontin at 2am and at 2:45 when pt had to use restroom, she began screaming in pain. Gave 1 percocet at that time with no improvement, so called EMS. Daughter reports that she has had no fevers or recent infectious symptoms. She has had no chest or abdominal pain and no further falls.  Patient is a 82 y.o. female presenting with hip pain. The history is provided by a relative.  Hip Pain    Past Medical History  Diagnosis Date  . Rheumatoid arthritis(714.0)   . Allergy   . Cancer ([place])   . Blood transfusion without reported diagnosis   . Anemia   . Cataract   . UTI (urinary tract infection)   . Melanoma [facility])    Past Surgical History  Procedure Laterality Date  . Joint replacement    . Mastectomy Left    . Hip replacement Bilateral   . Appendectomy    . Fracture surgery    . Breast surgery Left 2010    ? breast cancer   Family History  Problem Relation Age of Onset  . Emphysema Father     smoked and was exposed to mustard gas  . COPD Father   . Cancer Sister     esophageal  . Hypertension Daughter   . Hypertension Son   . Cancer Brother   . Cancer Sister    Social History  Substance Use Topics  . Smoking status: Never Smoker   . Smokeless tobacco: Never Used  . Alcohol Use: 0.6 oz/week    1 Glasses of wine per week     Comment: 1 glassred wine  at dinner each night   OB History    No data available     Review of Systems 10 Systems reviewed and are negative for acute change except as noted in the HPI.    Allergies  Review of patient's allergies indicates no known allergies.  Home Medications   Prior to Admission medications   Medication Sig Start Date End Date Taking? Authorizing Provider  barrier cream (NON-SPECIFIED) CREA Apply 1 application topically 2 (two) times daily as needed (for bedsores).    Historical Provider, MD  [employer] PAIN RELIEF EXTRA STRENGTH 500 MG tablet Take 1,000 mg by mouth every 6 (six) hours as needed.  for pain 01/30/16   Historical Provider, MD  docusate sodium (COLACE) 100 MG capsule Take 100 mg by mouth daily.    Historical Provider, MD  DULoxetine (CYMBALTA) 60 MG capsule TAKE 1 CAPSULE (60 MG TOTAL) BY MOUTH DAILY. 01/13/16   [name], NP  furosemide (LASIX) 20 MG tablet Take 1 tablet (20 mg total) by mouth daily as needed for edema. 12/16/15   [name], DO  hydroxychloroquine (PLAQUENIL) 200 MG tablet TAKE 1 TABLET (200 MG TOTAL) BY MOUTH DAILY. 01/31/16   [name], NP  lidocaine-prilocaine (EMLA) cream Apply 1 application topically as directed. Apply to lesions undergoing injection 30 mins prior to treatment    Historical Provider, MD  magnesium hydroxide (MILK OF MAGNESIA) 800 MG/5ML suspension Take 15 mLs by mouth  daily as needed for constipation.    Historical Provider, MD  Multiple Vitamin (MULTIVITAMIN) tablet Take 1 tablet by mouth daily.    Historical Provider, MD  nystatin (MYCOSTATIN/NYSTOP) 100000 UNIT/GM POWD Twice daily as needed to affected area 09/03/15   [name], NP  oxyCODONE (OXYCONTIN) 10 mg 12 hr tablet Take one tablet by mouth every 12 hours for pain 02/18/16   [name], NP  oxyCODONE-acetaminophen (PERCOCET) 5-325 MG tablet Take 1-2 tablets by mouth every 6 (six) hours as needed for severe pain. 02/13/16   [name], NP  polyethylene glycol (MIRALAX / GLYCOLAX) packet Take 17 g by mouth daily as needed for mild constipation or moderate constipation.     Historical Provider, MD  prochlorperazine (COMPAZINE) 10 MG tablet Take 10 mg by mouth every 6 (six) hours as needed for nausea or vomiting.    Historical Provider, MD  senna (SENOKOT) 8.6 MG tablet Take 2 tablets by mouth at bedtime as needed for constipation.     Historical Provider, MD  Skin Protectants, Misc. (CALAZIME SKIN PROTECTANT EX) Apply topically daily. After shower to prevent bed sores    Historical Provider, MD  Talimogene Laherparepvec (IMLYGIC) 1000000 UNIT/ML SUSP by Intralesional route. 1 treatment every 2 week at [place] Provider, MD   BP 152/75 mmHg  Pulse 79  Temp(Src) 97.8 F (36.6 C) (Oral)  Resp 22  SpO2 98% Physical Exam  Constitutional: She is oriented to person, place, and time. She appears well-developed and well-nourished.  Elderly woman, awake and alert, NAD  HENT:  Head: Normocephalic and atraumatic.  Moist mucous membranes  Eyes: Conjunctivae are normal.  Neck: Neck supple.  Cardiovascular: Normal rate, regular rhythm and normal heart sounds.   No murmur heard. Pulmonary/Chest: Effort normal and breath sounds normal. She exhibits no tenderness.  Abdominal: Soft. Bowel sounds are normal. She exhibits no distension. There is no tenderness.  Musculoskeletal: She  exhibits edema (mild BLE edema).  Pain with flexion and external rotation of R hip; normal sensation BLE, normal flexion at R knee  Neurological: She is alert and oriented to person, place, and time.  Fluent speech  Skin: Skin is warm and dry.  No ecchymoses; old skin tear lower R shin  Psychiatric: She has a normal mood and affect. Judgment normal.  Nursing note and vitals reviewed.   ED Course  Procedures (including critical care time) Labs Review Labs Reviewed  BASIC METABOLIC PANEL - Abnormal; Notable for the following:    BUN 26 (*)    Creatinine, Ser 1.20 (*)    Calcium 8.4 (*)    GFR calc non Af Amer 38 (*)  GFR calc Af Amer 44 (*)    All other components within normal limits  CBC WITH DIFFERENTIAL/PLATELET - Abnormal; Notable for the following:    RBC 2.73 (*)    Hemoglobin 7.7 (*)    HCT 24.4 (*)    All other components within normal limits  URINALYSIS, ROUTINE W REFLEX MICROSCOPIC (NOT AT [facility])  POC OCCULT BLOOD, ED    Imaging Review Ct Pelvis Wo Contrast  02/20/2016  CLINICAL DATA:  Fall 1 week ago. Severe right hip and groin pain. Initial encounter. EXAM: CT PELVIS WITHOUT CONTRAST.  CT RIGHT HIP WITHOUT CONTRAST TECHNIQUE: Multidetector CT imaging of the pelvis and right hip was performed following the standard protocol without intravenous contrast. COMPARISON:  Left hip CT 02/10/2016 FINDINGS: Negative for pelvic ring fracture or diastasis. Ventral cortical irregularity at S3 is chronic. Bilateral total hip arthroplasty with cerclage wire on the right around the intertrochanteric femur. Both hips are located. No acute periprosthetic fracture. A lucency in the right greater trochanter is corticated and chronic. Horizontal fracture across the L4 vertebral body without displacement. There is also an acute L4 right pars fracture, nondisplaced. Lumbar levoscoliosis chronic pelvic tilting. No acute or posttraumatic finding in the lower abdomen and pelvis. No visible acute  mild tendinous disruption. IMPRESSION: 1. Nondisplaced L4 body and right pars fractures. 2. Negative for pelvic or proximal femur fracture. 3. Bilateral total hip arthroplasty. Electronically Signed   By: Monte Fantasia M.D.   On: 02/20/2016 05:49   Ct Hip Right Wo Contrast  02/20/2016  CLINICAL DATA:  Fall 1 week ago. Severe right hip and groin pain. Initial encounter. EXAM: CT PELVIS WITHOUT CONTRAST.  CT RIGHT HIP WITHOUT CONTRAST TECHNIQUE: Multidetector CT imaging of the pelvis and right hip was performed following the standard protocol without intravenous contrast. COMPARISON:  Left hip CT 02/10/2016 FINDINGS: Negative for pelvic ring fracture or diastasis. Ventral cortical irregularity at S3 is chronic. Bilateral total hip arthroplasty with cerclage wire on the right around the intertrochanteric femur. Both hips are located. No acute periprosthetic fracture. A lucency in the right greater trochanter is corticated and chronic. Horizontal fracture across the L4 vertebral body without displacement. There is also an acute L4 right pars fracture, nondisplaced. Lumbar levoscoliosis chronic pelvic tilting. No acute or posttraumatic finding in the lower abdomen and pelvis. No visible acute mild tendinous disruption. IMPRESSION: 1. Nondisplaced L4 body and right pars fractures. 2. Negative for pelvic or proximal femur fracture. 3. Bilateral total hip arthroplasty. Electronically Signed   By: Monte Fantasia M.D.   On: 02/20/2016 05:49   I have personally reviewed and evaluated these images and lab results as part of my medical decision-making.   EKG Interpretation None      MDM   Final diagnoses:  Other closed fracture of fourth lumbar vertebra, initial encounter (HCC)  Anemia, unspecified anemia type    Pt p/w R hip pain after fall 10 days ago; initially complained of L hip pain but recently has had severe R hip pain w/ movement. She was awake and alert with stable vital signs at presentation. She  had pain with right straight leg lift, flexion and right hip, and external rotation of right hip. As plain films had R DiBenedetto obtained and were negative, obtained CT of right hip as well as pelvis to rule out occult fracture. CT shows acute fracture of L4 body and right pars. No other injuries. Given that the patient's pain is particularly when standing up and sitting down,  I suspect that these vertebral fractures of the source of her pain. She is neurologically intact but she has maximized therapy with oral medications including Percocet and OxyContin without improvement in her pain and she is unable to function at home due to the severity of her pain when performing ADLs. Her labs also show new anemia from last week w/ current Hgb 7.7. Hemoccult is positive.  I discussed admission with hospitalist, dr. Loleta Books, and pt admitted for further care.  Sharlett Iles, MD 02/20/16 [phone]

## 2016-02-20 NOTE — Progress Notes (Addendum)
ED CM noted Cm consult for hospital bed and regular w/c  111 7 Cm spoke with [name] of [facility] to discuss pt admission location and CM consult Pt to be followed for other d/c needs  1121 ED Cm notified [facility] DME coordinator via text of DME needs  1124 Put reminder in EPIC for attending to provider orders for w/c and hospital bed

## 2016-02-20 NOTE — Care Management Note (Signed)
Case Management Note  Patient Details  Name: Patty Walters MRN: [id number] Date of Birth: [birth year]  Subjective/Objective:                   Action/Plan:   Expected Discharge Date:   (unknown)               Expected Discharge Plan:  [place]  In-House Referral:     Discharge planning Services  CM Consult  Post Acute Care Choice:    Choice offered to:  Adult Children, Patient  DME Arranged:  Hospital bed, Wheelchair manual DME Agency:  [place]:    [place] Agency:  Other - See comment  Status of Service:  In process, will continue to follow  If discussed at Long Length of Stay Meetings, dates discussed:    Additional Comments: CM met with pt in room to discuss disposition but pt pleasantly confused and Cm called Daughter Patty Walters, [phone].  Pt lives with [place].  [name] requests to stay with [facility] for [facility] services; CM waiting for Pt/OT Eval recc and orders.  [name] of [facility] is aware and is following.  CM called [facility] DME rep, Germaine who will arrange for deliver of hospital bed and wheelchair with cushion to be delivered (Germaine is aware of [name]'s contact information for receipt of delivery).  CM has contacted MD to please sign the DME orders.  CM will continue to follow. Dellie Catholic, RN 02/20/2016, 12:57 PM

## 2016-02-20 NOTE — Evaluation (Signed)
Physical Therapy Evaluation Patient Details Name: Patty Walters MRN: [id number] DOB: May 03, 1922 Today's Date: 02/20/2016   History of Present Illness  Patty Walters is a 82 y.o. female with medical history significant of rheumatoid arthritis, history of melanoma and recurrent UTIs . Patient to ED  on 7/10 after a fall, returned home. she was presented to the emergency department,02/19/16 with intractible R hip pain. X-rays of the hips did not show any bony abnormalities. CT of the lumbar spine  Horizontal fracture across the L4 vertebral body without displacement and  L$ Pars fracture without displacement  Clinical Impression  The patient initially had minimal c/o pain. Increased with mobilizing to [facility] and recliner. The patient's daughter had left so unable to discuss DC plan. The orders indicate daughter requesting hospital bed and WC. Patient could benefit from short term rehab .  Pt admitted with above diagnosis. Pt currently with functional limitations due to the deficits listed below (see PT Problem List). Pt will benefit from skilled PT to increase their independence and safety with mobility to allow discharge to the venue listed below.       Follow Up Recommendations Home health PT;Supervision/Assistance - 24 hour;SNF (patient could benefit from SNF for rehab. daughter is not present)    [place] [facility] bed;Wheelchair (  Recommendations for Other Services       Precautions / Restrictions Precautions Precautions: Fall Required Braces or Orthoses: Spinal Brace Spinal Brace: Lumbar corset no real orders found for the brace.     Mobility  Bed Mobility Overal bed mobility: Needs Assistance Bed Mobility: Supine to Sit     Supine to sit: HOB elevated;Mod assist     General bed mobility comments: extra time, LSO in place by the orthotics person  Transfers Overall transfer level: Needs assistance Equipment used: Rolling walker (2 wheeled) Transfers: Sit  to/from [facility] Sit to Stand: Mod assist;+2 safety/equipment;From elevated surface;+2 physical assistance Stand pivot transfers: +2 physical assistance;+2 safety/equipment;Mod assist       General transfer comment: from bed to [facility], mod assist. after sitting on the  [facility], much more effort to stand and pivot to the recliner, c/o increased pain to 8.  Ambulation/Gait                Stairs            Wheelchair Mobility    Modified Rankin (Stroke Patients Only)       Balance                                             Pertinent Vitals/Pain Pain Assessment: Faces Pain Score: 8  Pain Location: after mobility, indicates in right groin Pain Descriptors / Indicators: Aching;Discomfort;Grimacing;Guarding;Sharp Pain Intervention(s): Limited activity within patient's tolerance;Monitored during session;Premedicated before session;Repositioned    Home Living Family/patient expects to be discharged to:: Private residence Living Arrangements: Children;Other relatives Available Help at Discharge: Family Type of Home: House Home Access: Stairs to enter   [employer] of Steps: 1 Home Layout: One level Home Equipment: [place] - 4 wheels;Bedside commode;Shower seat      Prior Function Level of Independence: Needs assistance   Gait / Transfers Assistance Needed: amulated in home with RW  ADL's / Homemaking Assistance Needed: daughter assisted with grooming and hygiene        Hand Dominance  Extremity/Trunk Assessment   Upper Extremity Assessment: Defer to OT evaluation           Lower Extremity Assessment: RLE deficits/detail;LLE deficits/detail RLE Deficits / Details: antalgic on the leg    Cervical / Trunk Assessment: Kyphotic  Communication   Communication: HOH  Cognition Arousal/Alertness: Awake/alert Behavior During Therapy: WFL for tasks assessed/performed Overall Cognitive Status: Difficult to  assess                 General Comments: may be due to [facility], per daughter, patient's MS was normal.    General Comments      Exercises        Assessment/Plan    PT Assessment Patient needs continued PT services  PT Diagnosis Difficulty walking;Acute pain   PT Problem List Decreased strength;Decreased activity tolerance;Decreased mobility;Decreased knowledge of precautions;Decreased safety awareness;Decreased knowledge of use of DME;Pain  PT Treatment Interventions DME instruction;Gait training;Functional mobility training;Therapeutic activities;Therapeutic exercise;Patient/family education   PT Goals (Current goals can be found in the Care Plan section) Acute Rehab PT Goals Patient Stated Goal: agreed to get up to [facility] PT Goal Formulation: With patient/family Time For Goal Achievement: 02/27/16 Potential to Achieve Goals: Good    Frequency Min 5X/week   Barriers to discharge        Co-evaluation               End of Session   Activity Tolerance: Patient limited by pain Patient left: in chair;with call bell/phone within reach;with chair alarm set;with family/visitor present Nurse Communication: Mobility status    Functional Assessment Tool Used: clinical judgement Functional Limitation: Mobility: Walking and moving around Mobility: Walking and Moving Around Current Status [id number]): At least 60 percent but less than 80 percent impaired, limited or restricted Mobility: Walking and Moving Around Goal Status [phone]): At least 1 percent but less than 20 percent impaired, limited or restricted    Time: 1149-1223 PT Time Calculation (min) (ACUTE ONLY): 34 min   Charges:   PT Evaluation $PT Eval Moderate Complexity: 1 Procedure PT Treatments $Therapeutic Activity: 8-22 mins   PT G Codes:   PT G-Codes **NOT FOR INPATIENT CLASS** Functional Assessment Tool Used: clinical judgement Functional Limitation: Mobility: Walking and moving around Mobility: Walking and  Moving Around Current Status [id number]): At least 60 percent but less than 80 percent impaired, limited or restricted Mobility: Walking and Moving Around Goal Status [phone]): At least 1 percent but less than 20 percent impaired, limited or restricted    Claretha Cooper 02/20/2016, 1:18 PM Tresa Endo PT [phone]

## 2016-02-20 NOTE — ED Notes (Signed)
Bed: WA04 Expected date:  Expected time:  Means of arrival:  Comments: EMS/82 yo female with hip pain-fall 1 week ago and seen here/evaluated-no fracture at that time-physical therapy and oxy po with no relief

## 2016-02-20 NOTE — Progress Notes (Addendum)
Call from radiologist pertaining to chest xray.  Results: "Question of enlarged AP window region mass. CT of the chest is recommended. Contrast administration is recommended unless it is contraindicated."  MD notified.   Patty Walters

## 2016-02-20 NOTE — ED Notes (Signed)
08:10 pt can go up.

## 2016-02-20 NOTE — H&P (Addendum)
History and Physical    Patty Walters [id number] DOB: [birth year] DOA: 02/20/2016  PCP: [name], NP  Patient coming from: Home  Chief Complaint: Hip pain  HPI: Patty Walters is a 82 y.o. female with medical history significant of rheumatoid arthritis, history of melanoma and recurrent UTIs given to the hospital complains about severe hip pain. Patient was started on 7/10 when she fell, she was presented to the emergency department, x-rays of the hips did not show any bony abnormalities, she even so [name] as outpatient. CT scan was ordered for the left hip did not show any abnormalities. She was started on OxyContin 10 mg, was not able to get the medication until last night. Patient kept having pain so she came into the hospital for further evaluation  ED Course:  Vitals: Stable, O2 sats dropped to 88 had to be on 1 L of oxygen. Labs: Creatinine 1.2, baseline 1.0 from 7/10, hemoglobin 7.7, it was 11.5 on 7/10. Imaging: CT right hip showed nondisplaced L4 and pars fractures. Interventions: given 4 mg of IV morphine in the emergency department.  Review of Systems: As per HPI otherwise 10 point review of systems negative.   Past Medical History  Diagnosis Date  . Rheumatoid arthritis(714.0)   . Allergy   . Cancer ([place])   . Blood transfusion without reported diagnosis   . Anemia   . Cataract   . UTI (urinary tract infection)   . Melanoma [facility])     Past Surgical History  Procedure Laterality Date  . Joint replacement    . Mastectomy Left   . Hip replacement Bilateral   . Appendectomy    . Fracture surgery    . Breast surgery Left 2010    ? breast cancer     reports that she has never smoked. She has never used smokeless tobacco. She reports that she drinks about 0.6 oz of alcohol per week. She reports that she does not use illicit drugs.  No Known Allergies  Family History  Problem Relation Age of Onset  . Emphysema Father     smoked and was exposed to  mustard gas  . COPD Father   . Cancer Sister     esophageal  . Hypertension Daughter   . Hypertension Son   . Cancer Brother   . Cancer Sister    Prior to Admission medications   Medication Sig Start Date End Date Taking? Authorizing Provider  barrier cream (NON-SPECIFIED) CREA Apply 1 application topically 2 (two) times daily as needed (for bedsores).    Historical Provider, MD  [employer] PAIN RELIEF EXTRA STRENGTH 500 MG tablet Take 1,000 mg by mouth every 6 (six) hours as needed. for pain 01/30/16   Historical Provider, MD  docusate sodium (COLACE) 100 MG capsule Take 100 mg by mouth daily.    Historical Provider, MD  DULoxetine (CYMBALTA) 60 MG capsule TAKE 1 CAPSULE (60 MG TOTAL) BY MOUTH DAILY. 01/13/16   [name], NP  furosemide (LASIX) 20 MG tablet Take 1 tablet (20 mg total) by mouth daily as needed for edema. 12/16/15   [name], DO  hydroxychloroquine (PLAQUENIL) 200 MG tablet TAKE 1 TABLET (200 MG TOTAL) BY MOUTH DAILY. 01/31/16   [name], NP  lidocaine-prilocaine (EMLA) cream Apply 1 application topically as directed. Apply to lesions undergoing injection 30 mins prior to treatment    Historical Provider, MD  magnesium hydroxide (MILK OF MAGNESIA) 800 MG/5ML suspension Take 15 mLs  by mouth daily as needed for constipation.    Historical Provider, MD  Multiple Vitamin (MULTIVITAMIN) tablet Take 1 tablet by mouth daily.    Historical Provider, MD  nystatin (MYCOSTATIN/NYSTOP) 100000 UNIT/GM POWD Twice daily as needed to affected area 09/03/15   [name], NP  oxyCODONE (OXYCONTIN) 10 mg 12 hr tablet Take one tablet by mouth every 12 hours for pain 02/18/16   [name], NP  oxyCODONE-acetaminophen (PERCOCET) 5-325 MG tablet Take 1-2 tablets by mouth every 6 (six) hours as needed for severe pain. 02/13/16   [name], NP  polyethylene glycol (MIRALAX / GLYCOLAX) packet Take 17 g by mouth daily as needed for mild constipation or moderate  constipation.     Historical Provider, MD  prochlorperazine (COMPAZINE) 10 MG tablet Take 10 mg by mouth every 6 (six) hours as needed for nausea or vomiting.    Historical Provider, MD  senna (SENOKOT) 8.6 MG tablet Take 2 tablets by mouth at bedtime as needed for constipation.     Historical Provider, MD  Skin Protectants, Misc. (CALAZIME SKIN PROTECTANT EX) Apply topically daily. After shower to prevent bed sores    Historical Provider, MD  Talimogene Laherparepvec (IMLYGIC) 1000000 UNIT/ML SUSP by Intralesional route. 1 treatment every 2 week at [place] Provider, MD    Physical Exam: Filed Vitals:   02/20/16 0600 02/20/16 0630 02/20/16 0700 02/20/16 0701  BP: 160/64 152/75    Pulse: 77 79    Temp:      TempSrc:      Resp: 14 19 22    SpO2: 92% 93% 88% 98%      Constitutional: NAD, calm, comfortable Filed Vitals:   02/20/16 0600 02/20/16 0630 02/20/16 0700 02/20/16 0701  BP: 160/64 152/75    Pulse: 77 79    Temp:      TempSrc:      Resp: 14 19 22    SpO2: 92% 93% 88% 98%   Eyes: PERRL, lids and conjunctivae normal ENMT: Mucous membranes are moist. Posterior pharynx clear of any exudate or lesions.Normal dentition.  Neck: normal, supple, no masses, no thyromegaly Respiratory: clear to auscultation bilaterally, no wheezing, no crackles. Normal respiratory effort. No accessory muscle use.  Cardiovascular: Regular rate and rhythm, no murmurs / rubs / gallops. No extremity edema. 2+ pedal pulses. No carotid bruits.  Abdomen: no tenderness, no masses palpated. No hepatosplenomegaly. Bowel sounds positive.  Musculoskeletal: no clubbing / cyanosis. No joint deformity upper and lower extremities. Good ROM, no contractures. Normal muscle tone.  Skin: no rashes, lesions, ulcers. No induration Neurologic: CN 2-12 grossly intact. Sensation intact, DTR normal. Strength 5/5 in all 4.  Psychiatric: Normal judgment and insight. Alert and oriented x 3. Normal mood.    Labs on  Admission: I have personally reviewed following labs and imaging studies  CBC:  Recent Labs Lab 02/20/16 0440  WBC 8.7  NEUTROABS 6.1  HGB 7.7*  HCT 24.4*  MCV 89.4  PLT [id number]   Basic Metabolic Panel:  Recent Labs Lab 02/20/16 0440  NA 135  K 5.0  CL 101  CO2 26  GLUCOSE 92  BUN 26*  CREATININE 1.20*  CALCIUM 8.4*   GFR: CrCl cannot be calculated (Unknown ideal weight.). Liver Function Tests: No results for input(s): AST, ALT, ALKPHOS, BILITOT, PROT, ALBUMIN in the last 168 hours. No results for input(s): LIPASE, AMYLASE in the last 168 hours. No results for input(s): AMMONIA in the last 168 hours. Coagulation Profile:  No results for input(s): INR, PROTIME in the last 168 hours. Cardiac Enzymes: No results for input(s): CKTOTAL, CKMB, CKMBINDEX, TROPONINI in the last 168 hours. BNP (last 3 results) No results for input(s): PROBNP in the last 8760 hours. HbA1C: No results for input(s): HGBA1C in the last 72 hours. CBG: No results for input(s): GLUCAP in the last 168 hours. Lipid Profile: No results for input(s): CHOL, HDL, LDLCALC, TRIG, CHOLHDL, LDLDIRECT in the last 72 hours. Thyroid Function Tests: No results for input(s): TSH, T4TOTAL, FREET4, T3FREE, THYROIDAB in the last 72 hours. Anemia Panel: No results for input(s): VITAMINB12, FOLATE, FERRITIN, TIBC, IRON, RETICCTPCT in the last 72 hours. Urine analysis:    Component Value Date/Time   COLORURINE YELLOW 02/20/2016 0814   APPEARANCEUR CLOUDY* 02/20/2016 0814   LABSPEC 1.010 02/20/2016 0814   PHURINE 7.0 02/20/2016 0814   GLUCOSEU NEGATIVE 02/20/2016 0814   HGBUR SMALL* 02/20/2016 0814   BILIRUBINUR NEGATIVE 02/20/2016 0814   KETONESUR NEGATIVE 02/20/2016 0814   PROTEINUR NEGATIVE 02/20/2016 0814   UROBILINOGEN 0.2 02/13/2015 0522   NITRITE NEGATIVE 02/20/2016 0814   LEUKOCYTESUR MODERATE* 02/20/2016 0814    No results found for this or any previous visit (from the past 240 hour(s)).    Radiological Exams on Admission: Ct Pelvis Wo Contrast  02/20/2016  CLINICAL DATA:  Fall 1 week ago. Severe right hip and groin pain. Initial encounter. EXAM: CT PELVIS WITHOUT CONTRAST.  CT RIGHT HIP WITHOUT CONTRAST TECHNIQUE: Multidetector CT imaging of the pelvis and right hip was performed following the standard protocol without intravenous contrast. COMPARISON:  Left hip CT 02/10/2016 FINDINGS: Negative for pelvic ring fracture or diastasis. Ventral cortical irregularity at S3 is chronic. Bilateral total hip arthroplasty with cerclage wire on the right around the intertrochanteric femur. Both hips are located. No acute periprosthetic fracture. A lucency in the right greater trochanter is corticated and chronic. Horizontal fracture across the L4 vertebral body without displacement. There is also an acute L4 right pars fracture, nondisplaced. Lumbar levoscoliosis chronic pelvic tilting. No acute or posttraumatic finding in the lower abdomen and pelvis. No visible acute mild tendinous disruption. IMPRESSION: 1. Nondisplaced L4 body and right pars fractures. 2. Negative for pelvic or proximal femur fracture. 3. Bilateral total hip arthroplasty. Electronically Signed   By: Monte Fantasia M.D.   On: 02/20/2016 05:49   Ct Hip Right Wo Contrast  02/20/2016  CLINICAL DATA:  Fall 1 week ago. Severe right hip and groin pain. Initial encounter. EXAM: CT PELVIS WITHOUT CONTRAST.  CT RIGHT HIP WITHOUT CONTRAST TECHNIQUE: Multidetector CT imaging of the pelvis and right hip was performed following the standard protocol without intravenous contrast. COMPARISON:  Left hip CT 02/10/2016 FINDINGS: Negative for pelvic ring fracture or diastasis. Ventral cortical irregularity at S3 is chronic. Bilateral total hip arthroplasty with cerclage wire on the right around the intertrochanteric femur. Both hips are located. No acute periprosthetic fracture. A lucency in the right greater trochanter is corticated and chronic.  Horizontal fracture across the L4 vertebral body without displacement. There is also an acute L4 right pars fracture, nondisplaced. Lumbar levoscoliosis chronic pelvic tilting. No acute or posttraumatic finding in the lower abdomen and pelvis. No visible acute mild tendinous disruption. IMPRESSION: 1. Nondisplaced L4 body and right pars fractures. 2. Negative for pelvic or proximal femur fracture. 3. Bilateral total hip arthroplasty. Electronically Signed   By: Monte Fantasia M.D.   On: 02/20/2016 05:49    EKG: Independently reviewed.   Assessment/Plan Principal Problem:  Lumbar vertebral fracture (HCC) Active Problems:   Rheumatoid arthritis (HCC)   Chronic anemia   Constipation   Edema   Lumbar vertebral fracture -Recent unwitnessed fall at work up before on 7/10 in the emergency department, still complaining about pain. -CT scan showed nondisplaced L4 body and right pars fractures. -Patient admitted for pain control, she is already started on OxyContin 10 mg last night and Percocet. -IV morphine for breakthrough pain. -PT/OT to evaluate and treat consider SNF.  Rheumatoid arthritis -Patient is on Plaquenil, continued, per daughter at bedside she always had some pain at baseline.  Acute on chronic anemia -Presented with hemoglobin of 7.7, baseline was 11.5 about 10 days ago, no evidence of overt bleeding. -Check FOBT, check CBC in a.m. if Trop less than 7.5 will transfuse.  Lower extremity edema -This is appears to be chronic, patient is on Lasix 20 mg orally. -I'll give 1 dose of Lasix as she is hypoxic as well.  Transient hypoxia -SPO2 1 down to 88% on room air, place and 1 L of oxygen. Patient has lower extremity edema. -I will check BNP and chest x-ray, will give 1 dose of IV Lasix. She might need more Lasix pending on the CXR and BMP. -Daughter the side reported her lower extremity edema is chronic but cannot rule out chronic CHF with mild exacerbation.  DVT  prophylaxis: Heparin Code Status: DNR Family Communication: Plan discussed with the patient in the presence of her daughter at bedside. Disposition Plan: To be determined home with home health versus SNF. Consults called: None Admission status: Observation, MedSurg.   [facility] A MD [facility] Pager [phone].  If 7PM-7AM, please contact night-coverage [email or web address] Password [email or web address]  02/20/2016, 8:50 AM

## 2016-02-21 DIAGNOSIS — D649 Anemia, unspecified: Secondary | ICD-10-CM | POA: Diagnosis not present

## 2016-02-21 DIAGNOSIS — S32049D Unspecified fracture of fourth lumbar vertebra, subsequent encounter for fracture with routine healing: Secondary | ICD-10-CM | POA: Diagnosis not present

## 2016-02-21 DIAGNOSIS — K5900 Constipation, unspecified: Secondary | ICD-10-CM | POA: Diagnosis not present

## 2016-02-21 DIAGNOSIS — R600 Localized edema: Secondary | ICD-10-CM | POA: Diagnosis not present

## 2016-02-21 LAB — CBC
HCT: 30.7 % — ABNORMAL LOW (ref 36.0–46.0)
Hemoglobin: 9.7 g/dL — ABNORMAL LOW (ref 12.0–15.0)
MCH: 28 pg (ref 26.0–34.0)
MCHC: 31.6 g/dL (ref 30.0–36.0)
MCV: 88.7 fL (ref 78.0–100.0)
PLATELETS: 310 10*3/uL (ref 150–400)
RBC: 3.46 MIL/uL — ABNORMAL LOW (ref 3.87–5.11)
RDW: 14.1 % (ref 11.5–15.5)
WBC: 7.1 10*3/uL (ref 4.0–10.5)

## 2016-02-21 LAB — BASIC METABOLIC PANEL
ANION GAP: 7 (ref 5–15)
BUN: 23 mg/dL — AB (ref 6–20)
CHLORIDE: 104 mmol/L (ref 101–111)
CO2: 27 mmol/L (ref 22–32)
CREATININE: 1.14 mg/dL — AB (ref 0.44–1.00)
Calcium: 8.6 mg/dL — ABNORMAL LOW (ref 8.9–10.3)
GFR calc non Af Amer: 40 mL/min — ABNORMAL LOW (ref >60)
GFR, EST AFRICAN AMERICAN: 47 mL/min — AB (ref >60)
GLUCOSE: 101 mg/dL — AB (ref 65–99)
Potassium: 4.5 mmol/L (ref 3.5–5.1)
SODIUM: 138 mmol/L (ref 135–145)

## 2016-02-21 LAB — HEMOGLOBIN A1C
HEMOGLOBIN A1C: 5.6 % (ref 4.8–5.6)
MEAN PLASMA GLUCOSE: 114 mg/dL

## 2016-02-21 MED ORDER — OXYCODONE HCL ER 15 MG PO T12A
15.0000 mg | EXTENDED_RELEASE_TABLET | Freq: Two times a day (BID) | ORAL | Status: DC
  Administered 2016-02-21 – 2016-02-23 (×5): 15 mg via ORAL
  Filled 2016-02-21 (×5): qty 1

## 2016-02-21 NOTE — Progress Notes (Signed)
OT Cancellation Note  Patient Details Name: Patty Walters MRN: [id number] DOB: Mar 09, 1922   Cancelled Treatment:    Reason Eval/Treat Not Completed: Other (comment).  Noted plan is for SNF and pt has a bed waiting. Will defer OT to that venue.  Patty Walters 02/21/2016, 2:55 PM  Lesle Chris, OTR/L [phone] 02/21/2016

## 2016-02-21 NOTE — Clinical Social Work Note (Signed)
Clinical Social Work Assessment  Patient Details  Name: Patty Walters MRN: [id number] Date of Birth: 1921-10-25  Date of referral:  02/21/16               Reason for consult:  Facility Placement                Permission sought to share information with:  Chartered certified accountant granted to share information::  Yes, Verbal Permission Granted  Name::        Agency::     Relationship::     Contact Information:     Housing/Transportation Living arrangements for the past 2 months:  Single Family Home Source of Information:  Patient, Adult Children Patient Interpreter Needed:  None Criminal Activity/Legal Involvement Pertinent to Current Situation/Hospitalization:  No - Comment as needed Significant Relationships:  Adult Children Lives with:  Adult Children Do you feel safe going back to the place where you live?  No Need for family participation in patient care:  Yes (Comment)  Care giving concerns:  CSW received call from Dr. Hartford Poli that patient's daughter is interested in SNF if possible.    Social Worker assessment / plan:  CSW met with patient & daughter, [name] at bedside re: discharge planning - patient has been living with her daughter, [name] for about the past 5 years and has been doing well - daughter expressed interest in SNF for short term rehab with plans for her to return to her home after rehab.   Employment status:  Retired Nurse, adult PT Recommendations:  [place], Home with [place] / Referral to community resources:  [place]  Patient/Family's Response to care:  Patient is agreeable with plan for SNF.   Patient/Family's Understanding of and Emotional Response to Diagnosis, Current Treatment, and Prognosis:  Patient has been having uncontrolled pain & daughter wants to make sure she is on a pain medication that can handle her pain before she leaves the hospital.   Emotional  Assessment Appearance:  Appears stated age Attitude/Demeanor/Rapport:    Affect (typically observed):    Orientation:  Oriented to Self, Oriented to Place, Oriented to  Time, Oriented to Situation Alcohol / Substance use:    Psych involvement (Current and /or in the community):     Discharge Needs  Concerns to be addressed:    Readmission within the last 30 days:    Current discharge risk:    Barriers to Discharge:      Standley Brooking, LCSW 02/21/2016, 10:28 AM

## 2016-02-21 NOTE — Progress Notes (Signed)
Physical Therapy Treatment Patient Details Name: Patty Walters MRN: [id number] DOB: Aug 21, 1921 Today's Date: 02/21/2016    History of Present Illness Patty Walters is a 82 y.o. female with medical history significant of rheumatoid arthritis, history of melanoma and recurrent UTIs . Patient to ED  on 7/10 after a fall, returned home. she was presented to the emergency department,02/19/16 with intractible R hip pain. X-rays of the hips did not show any bony abnormalities. CT of the lumbar spine  Horizontal fracture across the L4 vertebral body without displacement and  L$ Pars fracture without displacement    PT Comments    The patient is mobilizing better today and ambulated x 12'. The patient is planning to go to SNF now.  Follow Up Recommendations  SNF;Supervision/Assistance - 24 hour     Equipment Recommendations  None recommended by PT    Recommendations for Other Services       Precautions / Restrictions Precautions Precautions: Fall Required Braces or Orthoses: Spinal Brace Spinal Brace: Lumbar corset    Mobility  Bed Mobility               General bed mobility comments: in recliner  Transfers   Equipment used: Rolling walker (2 wheeled) Transfers: Sit to/from Stand Sit to Stand: Mod assist         General transfer comment: cues for safety and hand placement, cues to back up close to the recliner before sitting  Ambulation/Gait Ambulation/Gait assistance: Mod assist Ambulation Distance (Feet): 12 Feet Assistive device: Rolling walker (2 wheeled) Gait Pattern/deviations: Step-to pattern     General Gait Details: assist with turning the RW   Stairs            Wheelchair Mobility    Modified Rankin (Stroke Patients Only)       Balance                                    Cognition Arousal/Alertness: Awake/alert Behavior During Therapy: WFL for tasks assessed/performed Overall Cognitive Status: Within Functional Limits  for tasks assessed                      Exercises      General Comments        Pertinent Vitals/Pain Faces Pain Scale: Hurts even more Pain Location: R groin Pain Descriptors / Indicators: Aching Pain Intervention(s): Premedicated before session;Limited activity within patient's tolerance    Home Living                      Prior Function            PT Goals (current goals can now be found in the care plan section) Progress towards PT goals: Progressing toward goals    Frequency  Min 3X/week    PT Plan Discharge plan needs to be updated;Frequency needs to be updated    Co-evaluation             End of Session Equipment Utilized During Treatment: Back brace Activity Tolerance: Patient tolerated treatment well Patient left: in chair;with call bell/phone within reach;with chair alarm set     Time: 1405-1420 PT Time Calculation (min) (ACUTE ONLY): 15 min  Charges:  $Gait Training: 8-22 mins                    G Codes:  Patty Walters 02/21/2016, 2:30 PM

## 2016-02-21 NOTE — NC FL2 (Signed)
[place] MEDICAID FL2 LEVEL OF CARE SCREENING TOOL     IDENTIFICATION  Patient Name: Patty Walters Birthdate: 14-Aug-1921 Sex: female Admission Date (Current Location): 02/20/2016  [facility] and [place] Number:  Herbalist and Address:  [facility],  [place] [address], [place]      Provider Number: [id number]  Attending Physician Name and Address:  Verlee Monte, MD  Relative Name and Phone Number:       Current Level of Care: Hospital Recommended Level of Care: [place] Prior Approval Number:    Date Approved/Denied:   PASRR Number: [id number]  Discharge Plan: Home    Current Diagnoses: Patient Active Problem List   Diagnosis Date Noted  . Lumbar vertebral fracture ([place]) 02/20/2016  . Melanoma ([place]) 04/05/2015  . Constipation 02/21/2015  . Edema 02/21/2015  . Melanoma of skin ([place]) 12/27/2014  . Cough 06/13/2013  . Chronic anemia 02/29/2012  . Insomnia 09/10/2011  . Risk for falls 09/10/2011  . Neuropathy ([place]) 09/10/2011  . Rheumatoid arthritis ([place]) 09/10/2011    Orientation RESPIRATION BLADDER Height & Weight     Self, Time, Situation, Place  Normal Continent Weight: 140 lb (63.504 kg) Height:  5\' 4"  (162.6 cm)  BEHAVIORAL SYMPTOMS/MOOD NEUROLOGICAL BOWEL NUTRITION STATUS      Continent Diet (Heart)  AMBULATORY STATUS COMMUNICATION OF NEEDS Skin   Extensive Assist Verbally Normal                       Personal Care Assistance Level of Assistance  Bathing, Dressing Bathing Assistance: Limited assistance   Dressing Assistance: Limited assistance     Functional Limitations Info             SPECIAL CARE FACTORS FREQUENCY  PT (By licensed PT), OT (By licensed OT)     PT Frequency: 5 OT Frequency: 5            Contractures      Additional Factors Info  Code Status, Allergies Code Status Info: DNR Allergies Info: NKDA           Current Medications (02/21/2016):  This is the  current hospital active medication list Current Facility-Administered Medications  Medication Dose Route Frequency Provider Last Rate Last Dose  . 0.9 %  sodium chloride infusion   Intravenous Continuous Verlee Monte, MD 10 mL/hr at 02/20/16 1103    . acetaminophen (TYLENOL) tablet 650 mg  650 mg Oral Q6H PRN Verlee Monte, MD       Or  . acetaminophen (TYLENOL) suppository 650 mg  650 mg Rectal Q6H PRN Verlee Monte, MD      . barrier cream (non-specified) 1 application  1 application Topical BID PRN Verlee Monte, MD      . DULoxetine (CYMBALTA) DR capsule 60 mg  60 mg Oral Daily Verlee Monte, MD   60 mg at 02/21/16 0847  . furosemide (LASIX) tablet 20 mg  20 mg Oral Daily PRN Verlee Monte, MD      . heparin injection 5,000 Units  5,000 Units Subcutaneous Q8H Verlee Monte, MD   5,000 Units at 02/21/16 0556  . hydroxychloroquine (PLAQUENIL) tablet 200 mg  200 mg Oral Daily Verlee Monte, MD   200 mg at 02/21/16 0847  . magnesium hydroxide (MILK OF MAGNESIA) suspension 15 mL  15 mL Oral Daily PRN Verlee Monte, MD      . morphine 2 MG/ML injection 1 mg  1 mg Intravenous Q4H PRN Mutaz  Elmahi, MD   1 mg at 02/21/16 0927  . multivitamin with minerals tablet 1 tablet  1 tablet Oral Daily Verlee Monte, MD   1 tablet at 02/21/16 0847  . ondansetron (ZOFRAN) tablet 4 mg  4 mg Oral Q6H PRN Verlee Monte, MD       Or  . ondansetron (ZOFRAN) injection 4 mg  4 mg Intravenous Q6H PRN Verlee Monte, MD      . oxyCODONE (OXYCONTIN) 12 hr tablet 15 mg  15 mg Oral Q12H Mutaz Elmahi, MD      . oxyCODONE-acetaminophen (PERCOCET/ROXICET) 5-325 MG per tablet 1-2 tablet  1-2 tablet Oral Q6H PRN Verlee Monte, MD   2 tablet at 02/21/16 0847  . polyethylene glycol (MIRALAX / GLYCOLAX) packet 17 g  17 g Oral Daily Verlee Monte, MD   17 g at 02/20/16 1100  . prochlorperazine (COMPAZINE) tablet 10 mg  10 mg Oral Q6H PRN Verlee Monte, MD      . senna (SENOKOT) tablet 17.2 mg  2 tablet Oral QHS Verlee Monte, MD   17.2 mg at  02/20/16 2259     Discharge Medications: Please see discharge summary for a list of discharge medications.  Relevant Imaging Results:  Relevant Lab Results:   Additional Information SSN: [id number]  Standley Brooking, LCSW

## 2016-02-21 NOTE — Clinical Social Work Placement (Signed)
   CLINICAL SOCIAL WORK PLACEMENT  NOTE  Date:  02/21/2016  Patient Details  Name: Patty Walters MRN: [id number] Date of Birth: 1922/05/17  Clinical Social Work is seeking post-discharge placement for this patient at the [place] level of care (*CSW will initial, date and re-position this form in  chart as items are completed):  Yes   Patient/family provided with [place] Work Department's list of facilities offering this level of care within the geographic area requested by the patient (or if unable, by the patient's family).  Yes   Patient/family informed of their freedom to choose among providers that offer the needed level of care, that participate in Medicare, Medicaid or managed care program needed by the patient, have an available bed and are willing to accept the patient.  Yes   Patient/family informed of [place]'s ownership interest in [facility] and [facility], as well as of the fact that they are under no obligation to receive care at these facilities.  PASRR submitted to EDS on 02/21/16     PASRR number received on 02/21/16     Existing PASRR number confirmed on       FL2 transmitted to all facilities in geographic area requested by pt/family on 02/21/16     FL2 transmitted to all facilities within larger geographic area on       Patient informed that his/her managed care company has contracts with or will negotiate with certain facilities, including the following:            Patient/family informed of bed offers received.  Patient chooses bed at       Physician recommends and patient chooses bed at      Patient to be transferred to   on  .  Patient to be transferred to facility by       Patient family notified on   of transfer.  Name of family member notified:        PHYSICIAN       Additional Comment:    _______________________________________________ Standley Brooking, LCSW 02/21/2016, 10:36 AM

## 2016-02-21 NOTE — Clinical Social Work Placement (Signed)
Patient has a bed at [facility].  CSW confirmed with Ivin Booty at [facility] that they would be able to take patient over the weekend if ready.   Please call weekend CSW (ph#: [phone]) to facilitate discharge.     [name], [facility] Clinical Social Worker cell #: [phone]     CLINICAL SOCIAL WORK PLACEMENT  NOTE  Date:  02/21/2016  Patient Details  Name: Patty Walters MRN: [id number] Date of Birth: April 10, 1922  Clinical Social Work is seeking post-discharge placement for this patient at the [place] level of care (*CSW will initial, date and re-position this form in  chart as items are completed):  Yes   Patient/family provided with [place] Work Department's list of facilities offering this level of care within the geographic area requested by the patient (or if unable, by the patient's family).  Yes   Patient/family informed of their freedom to choose among providers that offer the needed level of care, that participate in Medicare, Medicaid or managed care program needed by the patient, have an available bed and are willing to accept the patient.  Yes   Patient/family informed of [facility]'s ownership interest in [facility] and [facility], as well as of the fact that they are under no obligation to receive care at these facilities.  PASRR submitted to EDS on 02/21/16     PASRR number received on 02/21/16     Existing PASRR number confirmed on       FL2 transmitted to all facilities in geographic area requested by pt/family on 02/21/16     FL2 transmitted to all facilities within larger geographic area on       Patient informed that his/her managed care company has contracts with or will negotiate with certain facilities, including the following:        Yes   Patient/family informed of bed offers received.  Patient chooses bed at [facility]     Physician recommends and patient  chooses bed at      Patient to be transferred to [facility] on  .  Patient to be transferred to facility by       Patient family notified on   of transfer.  Name of family member notified:        PHYSICIAN       Additional Comment:    _______________________________________________ Standley Brooking, LCSW 02/21/2016, 11:55 AM

## 2016-02-21 NOTE — Progress Notes (Signed)
PROGRESS NOTE  Patty Walters  [id number] DOB: 09-25-1921 DOA: 02/20/2016 PCP: Sharon Seller, NP Outpatient Specialists:  Subjective: Sleepy seen with her daughter at bedside, she does not have pain while she is in bed. Pain increases to 8 out of 10 when she moves.  Brief Narrative:  Patty Walters is a 82 y.o. female with medical history significant of rheumatoid arthritis, history of melanoma and recurrent UTIs given to the hospital complains about severe hip pain. Patient was started on 7/10 when she fell, she was presented to the emergency department, x-rays of the hips did not show any bony abnormalities, she even so Dr. Shon Baton as outpatient. CT scan was ordered for the left hip did not show any abnormalities. She was started on OxyContin 10 mg, was not able to get the medication until last night. Patient kept having pain so she came into the hospital for further evaluation  Assessment & Plan:   Principal Problem:   Lumbar vertebral fracture (HCC) Active Problems:   Rheumatoid arthritis (HCC)   Chronic anemia   Constipation   Edema   L4 Lumbar vertebral fracture -Recent unwitnessed fall, worked on 7/10 in the ED, came back to the hospital because of persist pain -CT scan showed nondisplaced L4 body and right pars fractures. -Patient admitted for pain control, she is already started on OxyContin 10 mg last night and Percocet. -IV morphine for breakthrough pain. -Pain is better while she is in bed, still complaining about severe pain with movement. -Social worker to evaluate, OxyContin increased to 15 mg.  Abnormal CT of the chest with findings of 3.5 cm lesion -X-ray of the chest was done because of transient hypoxia in the ED, showed questionable mass, has history of melanoma. -CT angio of the chest was obtained and showed 3.5 fluid versus pericardial cyst. -Per radiology this is unlikely to be necrotic lymph node or metastasis, discussed with both daughters. -One of  the daughters is advanced Publishing rights manager, Mrs. need follow-up in 2-3 months with CT scan.  Rheumatoid arthritis -Patient is on Plaquenil, continued, per daughter at bedside she always had some pain at baseline.  Acute on chronic anemia -Presented with hemoglobin of 7.7, baseline was 11.5 about 10 days ago, no evidence of overt bleeding. -This is probably variation because of fluids status, hemoglobin is 9.7 today without any interventions. -Has chronic anemia of chronic disease secondary to rheumatoid arthritis  Lower extremity edema -Per daughter this is chronic, patient is on Lasix 20 mg orally. -Given 1 dose of Lasix.  Transient hypoxia -SPO2 1 down to 88% on room air, place and 1 L of oxygen. Patient has lower extremity edema. -I will check BNP and chest x-ray, will give 1 dose of IV Lasix. She might need more Lasix pending on the CXR and BMP. -Daughter the side reported her lower extremity edema is chronic but cannot rule out chronic CHF with mild exacerbation.  History of melanoma -Status post resection  DVT prophylaxis:  Code Status: DNR Family Communication: D/W with daughter [name] [facility]) at bedside and called daughter Darl Pikes Conservation officer, nature in [place]) Disposition Plan:  Diet: Diet Heart Room service appropriate?: Yes; Fluid consistency:: Thin  Consultants:   None  Procedures:   None  Antimicrobials:   None  Objective: Filed Vitals:   02/20/16 0907 02/20/16 1515 02/20/16 2134 02/21/16 0518  BP: 142/79 110/52 128/68 132/47  Pulse: 78 73 75 73  Temp: 97.8 F (36.6 C) 98.1 F (36.7 C) 97.8 F (36.6 C)  98.1 F (36.7 C)  TempSrc: Oral Oral Oral Oral  Resp: 16 15 16 16   Height:      Weight:      SpO2: 97% 94% 94% 95%    Intake/Output Summary (Last 24 hours) at 02/21/16 0918 Last data filed at 02/21/16 0600  Gross per 24 hour  Intake    620 ml  Output    200 ml  Net    420 ml   Filed Weights   02/20/16 0902  Weight: 63.504 kg (140 lb)     Examination: General exam: Appears calm and comfortable  Respiratory system: Clear to auscultation. Respiratory effort normal. Cardiovascular system: S1 & S2 heard, RRR. No JVD, murmurs, rubs, gallops or clicks. No pedal edema. Gastrointestinal system: Abdomen is nondistended, soft and nontender. No organomegaly or masses felt. Normal bowel sounds heard. Central nervous system: Alert and oriented. No focal neurological deficits. Extremities: Symmetric 5 x 5 power. Skin: No rashes, lesions or ulcers Psychiatry: Judgement and insight appear normal. Mood & affect appropriate.   Data Reviewed: I have personally reviewed following labs and imaging studies  CBC:  Recent Labs Lab 02/20/16 0440 02/21/16 0442  WBC 8.7 7.1  NEUTROABS 6.1  --   HGB 7.7* 9.7*  HCT 24.4* 30.7*  MCV 89.4 88.7  PLT 400 310   Basic Metabolic Panel:  Recent Labs Lab 02/20/16 0440 02/21/16 0442  NA 135 138  K 5.0 4.5  CL 101 104  CO2 26 27  GLUCOSE 92 101*  BUN 26* 23*  CREATININE 1.20* 1.14*  CALCIUM 8.4* 8.6*   GFR: Estimated Creatinine Clearance: 27.2 mL/min (by C-G formula based on Cr of 1.14). Liver Function Tests: No results for input(s): AST, ALT, ALKPHOS, BILITOT, PROT, ALBUMIN in the last 168 hours. No results for input(s): LIPASE, AMYLASE in the last 168 hours. No results for input(s): AMMONIA in the last 168 hours. Coagulation Profile:  Recent Labs Lab 02/20/16 1021  INR 1.06   Cardiac Enzymes: No results for input(s): CKTOTAL, CKMB, CKMBINDEX, TROPONINI in the last 168 hours. BNP (last 3 results) No results for input(s): PROBNP in the last 8760 hours. HbA1C:  Recent Labs  02/20/16 1021  HGBA1C 5.6   CBG: No results for input(s): GLUCAP in the last 168 hours. Lipid Profile: No results for input(s): CHOL, HDL, LDLCALC, TRIG, CHOLHDL, LDLDIRECT in the last 72 hours. Thyroid Function Tests:  Recent Labs  02/20/16 1021  TSH 1.511   Anemia Panel:  Recent Labs   02/20/16 1428  VITAMINB12 1066*  FOLATE 45.2  FERRITIN 57  TIBC 323  IRON 29  RETICCTPCT 1.3   Urine analysis:    Component Value Date/Time   COLORURINE YELLOW 02/20/2016 0814   APPEARANCEUR CLOUDY* 02/20/2016 0814   LABSPEC 1.010 02/20/2016 0814   PHURINE 7.0 02/20/2016 0814   GLUCOSEU NEGATIVE 02/20/2016 0814   HGBUR SMALL* 02/20/2016 0814   BILIRUBINUR NEGATIVE 02/20/2016 0814   KETONESUR NEGATIVE 02/20/2016 0814   PROTEINUR NEGATIVE 02/20/2016 0814   UROBILINOGEN 0.2 02/13/2015 0522   NITRITE NEGATIVE 02/20/2016 0814   LEUKOCYTESUR MODERATE* 02/20/2016 0814   Sepsis Labs: @LABRCNTIP (procalcitonin:4,lacticidven:4)  )No results found for this or any previous visit (from the past 240 hour(s)).   Invalid input(s): PROCALCITONIN, LACTICACIDVEN   Radiology Studies: Ct Angio Chest Pe W Or Wo Contrast  02/20/2016  CLINICAL DATA:  Hypoxia and abnormal chest x-ray. EXAM: CT ANGIOGRAPHY CHEST WITH CONTRAST TECHNIQUE: Multidetector CT imaging of the chest was performed using the standard protocol during  bolus administration of intravenous contrast. Multiplanar CT image reconstructions and MIPs were obtained to evaluate the vascular anatomy. CONTRAST:  80 cc of Isovue 370 intravenously. COMPARISON:  Chest radiograph 02/20/2016 FINDINGS: Mediastinum/Lymph Nodes: No pulmonary emboli or thoracic aortic dissection identified. No masses or pathologically enlarged lymph nodes identified. There is a 3.5 by 2.1 cm fluid collection within the aortopulmonary window recess, an extension of the superior aortic recess. This finding likely accounts for the abnormality seen radiographically. Lungs/Pleura: Biapical scarring is noted. There is focal perifissural thickening in the right lower lobe, which may also represent scarring. Mild bibasilar interstitial thickening is seen. Upper abdomen: No acute findings. Musculoskeletal: No chest wall mass or suspicious bone lesions identified. Review of the MIP  images confirms the above findings. IMPRESSION: 3.5 cm fluid collection versus pericardial cyst within the aortopulmonary window recess likely accounts for the radiographically seen abnormality. Alternatively but less likely this may represent necrotic lymphadenopathy. No focal airspace consolidation. Mild cardiomegaly. Electronically Signed   By: Ted Mcalpine M.D.   On: 02/20/2016 19:53   Ct Pelvis Wo Contrast  02/20/2016  CLINICAL DATA:  Fall 1 week ago. Severe right hip and groin pain. Initial encounter. EXAM: CT PELVIS WITHOUT CONTRAST.  CT RIGHT HIP WITHOUT CONTRAST TECHNIQUE: Multidetector CT imaging of the pelvis and right hip was performed following the standard protocol without intravenous contrast. COMPARISON:  Left hip CT 02/10/2016 FINDINGS: Negative for pelvic ring fracture or diastasis. Ventral cortical irregularity at S3 is chronic. Bilateral total hip arthroplasty with cerclage wire on the right around the intertrochanteric femur. Both hips are located. No acute periprosthetic fracture. A lucency in the right greater trochanter is corticated and chronic. Horizontal fracture across the L4 vertebral body without displacement. There is also an acute L4 right pars fracture, nondisplaced. Lumbar levoscoliosis chronic pelvic tilting. No acute or posttraumatic finding in the lower abdomen and pelvis. No visible acute mild tendinous disruption. IMPRESSION: 1. Nondisplaced L4 body and right pars fractures. 2. Negative for pelvic or proximal femur fracture. 3. Bilateral total hip arthroplasty. Electronically Signed   By: Marnee Spring M.D.   On: 02/20/2016 05:49   Ct Hip Right Wo Contrast  02/20/2016  CLINICAL DATA:  Fall 1 week ago. Severe right hip and groin pain. Initial encounter. EXAM: CT PELVIS WITHOUT CONTRAST.  CT RIGHT HIP WITHOUT CONTRAST TECHNIQUE: Multidetector CT imaging of the pelvis and right hip was performed following the standard protocol without intravenous contrast.  COMPARISON:  Left hip CT 02/10/2016 FINDINGS: Negative for pelvic ring fracture or diastasis. Ventral cortical irregularity at S3 is chronic. Bilateral total hip arthroplasty with cerclage wire on the right around the intertrochanteric femur. Both hips are located. No acute periprosthetic fracture. A lucency in the right greater trochanter is corticated and chronic. Horizontal fracture across the L4 vertebral body without displacement. There is also an acute L4 right pars fracture, nondisplaced. Lumbar levoscoliosis chronic pelvic tilting. No acute or posttraumatic finding in the lower abdomen and pelvis. No visible acute mild tendinous disruption. IMPRESSION: 1. Nondisplaced L4 body and right pars fractures. 2. Negative for pelvic or proximal femur fracture. 3. Bilateral total hip arthroplasty. Electronically Signed   By: Marnee Spring M.D.   On: 02/20/2016 05:49   Dg Chest Port 1 View  02/20/2016  CLINICAL DATA:  Hypoxia today, hx non-smoker, left breast cancer EXAM: PORTABLE CHEST 1 VIEW COMPARISON:  02/10/2016 FINDINGS: Heart is mildly enlarged but also accentuated by the technique. There is elevation of right hemidiaphragm which is stable.  Perihilar peribronchial thickening is noted. There are no focal consolidations. No pleural effusions or pulmonary edema. There is convex deformity in the region the AP window common warranting further evaluation. A cystic lesion has been demonstrated in this region on previous CT exam however followup is warranted given the history of left breast cancer and melanoma. Note is made of chronic changes in both shoulders indicating probable chronic rotator cuff injuries. IMPRESSION: 1. Question of enlarged AP window region mass. CT of the chest is recommended. Contrast administration is recommended unless it is contraindicated. 2. Bronchitic changes without focal pulmonary consolidation. Electronically Signed   By: Norva Pavlov M.D.   On: 02/20/2016 15:42         Scheduled Meds: . DULoxetine  60 mg Oral Daily  . heparin  5,000 Units Subcutaneous Q8H  . hydroxychloroquine  200 mg Oral Daily  . multivitamin with minerals  1 tablet Oral Daily  . oxyCODONE  15 mg Oral Q12H  . polyethylene glycol  17 g Oral Daily  . senna  2 tablet Oral QHS   Continuous Infusions: . sodium chloride 10 mL/hr at 02/20/16 1103        Time spent: 35 minutes    Kami Kube A, MD [facility] Pager [phone]  If 7PM-7AM, please contact night-coverage [email or web address] Password [email or web address] 02/21/2016, 9:18 AM

## 2016-02-22 DIAGNOSIS — R600 Localized edema: Secondary | ICD-10-CM | POA: Diagnosis not present

## 2016-02-22 DIAGNOSIS — S32049D Unspecified fracture of fourth lumbar vertebra, subsequent encounter for fracture with routine healing: Secondary | ICD-10-CM | POA: Diagnosis not present

## 2016-02-22 DIAGNOSIS — K5900 Constipation, unspecified: Secondary | ICD-10-CM | POA: Diagnosis not present

## 2016-02-22 DIAGNOSIS — D649 Anemia, unspecified: Secondary | ICD-10-CM | POA: Diagnosis not present

## 2016-02-22 LAB — BASIC METABOLIC PANEL
ANION GAP: 5 (ref 5–15)
BUN: 24 mg/dL — ABNORMAL HIGH (ref 6–20)
CALCIUM: 8.6 mg/dL — AB (ref 8.9–10.3)
CHLORIDE: 106 mmol/L (ref 101–111)
CO2: 27 mmol/L (ref 22–32)
CREATININE: 1.15 mg/dL — AB (ref 0.44–1.00)
GFR calc non Af Amer: 40 mL/min — ABNORMAL LOW (ref >60)
GFR, EST AFRICAN AMERICAN: 46 mL/min — AB (ref >60)
GLUCOSE: 97 mg/dL (ref 65–99)
Potassium: 4.7 mmol/L (ref 3.5–5.1)
SODIUM: 138 mmol/L (ref 135–145)

## 2016-02-22 LAB — CBC
HCT: 29.8 % — ABNORMAL LOW (ref 36.0–46.0)
Hemoglobin: 9.5 g/dL — ABNORMAL LOW (ref 12.0–15.0)
MCH: 28.2 pg (ref 26.0–34.0)
MCHC: 31.9 g/dL (ref 30.0–36.0)
MCV: 88.4 fL (ref 78.0–100.0)
PLATELETS: 336 10*3/uL (ref 150–400)
RBC: 3.37 MIL/uL — AB (ref 3.87–5.11)
RDW: 14.1 % (ref 11.5–15.5)
WBC: 8.1 10*3/uL (ref 4.0–10.5)

## 2016-02-22 MED ORDER — POLYETHYLENE GLYCOL 3350 17 G PO PACK: 17.0000 g | PACK | Freq: Every day | ORAL | Status: DC

## 2016-02-22 MED ORDER — OXYCODONE-ACETAMINOPHEN 5-325 MG PO TABS: 1.0000 | ORAL_TABLET | Freq: Four times a day (QID) | ORAL | Status: DC | PRN

## 2016-02-22 MED ORDER — TRIPLE ANTIBIOTIC 5-400-5000 EX OINT
TOPICAL_OINTMENT | Freq: Three times a day (TID) | CUTANEOUS | Status: AC
Start: 2016-02-22 — End: ?

## 2016-02-22 MED ORDER — SENNOSIDES 8.6 MG PO TABS: 2.0000 | ORAL_TABLET | Freq: Every day | ORAL | Status: DC

## 2016-02-22 MED ORDER — OXYCODONE HCL ER 15 MG PO T12A: 15.0000 mg | EXTENDED_RELEASE_TABLET | Freq: Two times a day (BID) | ORAL | Status: DC

## 2016-02-22 MED ORDER — MORPHINE SULFATE (PF) 2 MG/ML IV SOLN
1.0000 mg | INTRAVENOUS | Status: AC
  Administered 2016-02-22: 1 mg via INTRAVENOUS

## 2016-02-22 NOTE — Progress Notes (Signed)
Pt refused heparin. Attempted to educate the need and pt stated "I don't want a shot but thank you for asking". Will continue to monitor.

## 2016-02-22 NOTE — Progress Notes (Signed)
CSW received call from RN, Anderson Malta that patient's discharge is being cancelled due to uncontrolled pain needing IV pain medications. CSW cancelled transport pickup & [name] at [facility] made aware.    [name], [facility] Clinical Social Worker cell #: [phone]

## 2016-02-22 NOTE — Progress Notes (Signed)
Pt presented with uncontrollable pain and groaning out loud in tears after given routine oxycontin and prn percocet and morphine. Daughter was very concerned about pain being controlled without IV pain meds when transferred to SNF and did not feel that today was a good day to discharge pt. MD notified. 1mg  Morphine was given IV and MD stated she could dc tomorrow. Pt currently resting well in chair watching tv showing no signs of discomfort. Will continue to monitor.

## 2016-02-22 NOTE — Discharge Summary (Signed)
Physician Discharge Summary  Patty Walters [id number] DOB: [birth year] DOA: 02/20/2016  PCP: [name], NP  Admit date: 02/20/2016 Discharge date: 02/22/2016  Admitted From: Home Disposition: SNF  Recommendations for Outpatient Follow-up:  1. Follow up with PCP in 1-2 weeks 2. Please obtain BMP/CBC in one week 3. Continue PT/OT  Home Health: No Equipment/Devices: NA  Discharge Condition: Stable CODE STATUS: DNR/DNI Diet recommendation: Heart Healthy  Brief/Interim Summary: Patty Walters is a 82 y.o. female with medical history significant of rheumatoid arthritis, history of melanoma and recurrent UTIs given to the hospital complains about severe hip pain. Patient was started on 7/10 when she fell, she was presented to the emergency department, x-rays of the hips did not show any bony abnormalities, she even so [name] as outpatient. CT scan was ordered for the left hip did not show any abnormalities. She was started on OxyContin 10 mg, was not able to get the medication until last night. Patient kept having pain so she came into the hospital for further evaluation  Discharge Diagnoses:  Principal Problem:   Lumbar vertebral fracture (HCC) Active Problems:   Rheumatoid arthritis ([place])   Chronic anemia   Constipation   Edema    L4 Lumbar vertebral fracture -Recent unwitnessed fall, on 7/10, evaluated back then in the ED but came back in the hospital because of persistent pain. -CT scan showed nondisplaced L4 body and right pars fractures. -Patient admitted for pain control, she is already started on OxyContin 10 mg last night and Percocet. -IV morphine for breakthrough pain. -Pain is better while she is in bed, still complaining about severe pain with movement. -PT/OT are recommending skilled nursing facility. -Pain is controlled now with 15 mg of OxyContin and Percocet as needed, monitor sedation. Bowel regimen started.  Abnormal CT of the chest with findings  of 3.5 cm lesion -X-ray of the chest was done because of transient hypoxia in the ED, showed questionable mass, has history of melanoma. -CT angio of the chest was obtained and showed 3.5 fluid versus pericardial cyst. -Per radiology this is less likely to be necrotic lymph node or metastasis, discussed with both daughters. -One of the daughters is advanced Designer, jewellery, Mrs. need follow-up in 2-3 months with CT scan.  Rheumatoid arthritis -Patient is on Plaquenil, continued, per daughter at bedside she always had some pain at baseline.  Acute on chronic anemia -Presented with hemoglobin of 7.7, baseline was 11.5 about 10 days ago, no evidence of overt bleeding. -This is probably variation because of fluids status, hemoglobin is 9.7 today without any interventions. -Has chronic anemia of chronic disease secondary to rheumatoid arthritis  Lower extremity edema -Per daughter this is chronic, patient is on Lasix 20 mg orally. -Given 1 dose of Lasix. Has abrasion on the right shin, Neosporin prescribed on discharge.  Transient hypoxia -SPO2 1 down to 88% on room air, place and 1 L of oxygen. Patient has lower extremity edema. -BNP was slightly elevated, chest x-ray did not show pulmonary edema, given 1 dose of Lasix. -Not sure if this just fluid retention from CKD. or has history of CHF no recent echo. Cannot clinically determine.  History of melanoma -Status post resection  CKD stage 2-3 -Baseline creatinine around 1.1, this is re-present CKD stage 2-3.  Discharge Instructions  Discharge Instructions    Diet - low sodium heart healthy    Complete by:  As directed      Increase activity slowly    Complete  by:  As directed             Medication List    STOP taking these medications        docusate sodium 100 MG capsule  Commonly known as:  COLACE      TAKE these medications        barrier cream Crea  Commonly known as:  non-specified  Apply 1 application  topically 2 (two) times daily as needed (To prevent bedsores.).     [employer] PAIN RELIEF EXTRA STRENGTH 500 MG tablet  Generic drug:  acetaminophen  Take 500-1,000 mg by mouth every 6 (six) hours as needed (For breakthrough pain.).     DULoxetine 60 MG capsule  Commonly known as:  CYMBALTA  TAKE 1 CAPSULE (60 MG TOTAL) BY MOUTH DAILY.     furosemide 20 MG tablet  Commonly known as:  LASIX  Take 1 tablet (20 mg total) by mouth daily as needed for edema.     hydroxychloroquine 200 MG tablet  Commonly known as:  PLAQUENIL  TAKE 1 TABLET (200 MG TOTAL) BY MOUTH DAILY.     IMLYGIC 1000000 UNIT/ML Susp  Generic drug:  Talimogene Laherparepvec  1 each by Intralesional route every 14 (fourteen) days.     lidocaine-prilocaine cream  Commonly known as:  EMLA  Apply 1 application topically as needed (Apply to lesions undergoing injection 30 mins prior to treatment.).     magnesium hydroxide 800 MG/5ML suspension  Commonly known as:  MILK OF MAGNESIA  Take 15 mLs by mouth daily.     multivitamin tablet  Take 1 tablet by mouth daily.     neomycin-bacitracin-polymyxin [phone] ointment  Apply topically 3 (three) times daily. Right shin abrasion     nystatin powder  Generic drug:  nystatin  Twice daily as needed to affected area     OVER THE COUNTER MEDICATION  Place 2-3 drops into both eyes as needed (For eye irritation.). [employer] Eye Allergy Relief Eye Drops     oxyCODONE 15 mg 12 hr tablet  Commonly known as:  OXYCONTIN  Take 1 tablet (15 mg total) by mouth every 12 (twelve) hours.     oxyCODONE-acetaminophen 5-325 MG tablet  Commonly known as:  PERCOCET  Take 1-2 tablets by mouth every 6 (six) hours as needed for severe pain.     polyethylene glycol packet  Commonly known as:  MIRALAX / GLYCOLAX  Take 17 g by mouth daily.     prochlorperazine 10 MG tablet  Commonly known as:  COMPAZINE  Take 10 mg by mouth every 6 (six) hours as needed for nausea or vomiting.     senna 8.6 MG  tablet  Commonly known as:  SENOKOT  Take 2 tablets (17.2 mg total) by mouth daily. Hold if has diarrhea or loose stools           Follow-up Information    Follow up with [facility].   Why:  home health services   Contact information:   [phone]      Follow up with [facility].   Why:  hospital bed and wheelchair with cushion   Contact information:   [address] [place] [address] [phone]      No Known Allergies  Consultations:  None   Procedures/Studies: Dg Chest 2 View  02/10/2016  CLINICAL DATA:  Status post fall today.  Left hip pain. EXAM: CHEST  2 VIEW COMPARISON:  CT chest and  PA and lateral chest 06/03/2015. FINDINGS: The lungs are clear. Heart size is upper normal. No pneumothorax or pleural effusion. Marked degenerative change about the shoulders is noted. IMPRESSION: No acute disease. Electronically Signed   By: Inge Rise M.D.   On: 02/10/2016 19:39   Ct Head Wo Contrast  02/10/2016  CLINICAL DATA:  Status post fall.  Hit head. EXAM: CT HEAD WITHOUT CONTRAST CT CERVICAL SPINE WITHOUT CONTRAST TECHNIQUE: Multidetector CT imaging of the head and cervical spine was performed following the standard protocol without intravenous contrast. Multiplanar CT image reconstructions of the cervical spine were also generated. COMPARISON:  03/05/2015 FINDINGS: CT HEAD FINDINGS There is no evidence of mass effect, midline shift, or extra-axial fluid collections. There is no evidence of a space-occupying lesion or intracranial hemorrhage. There is no evidence of a cortical-based area of acute infarction. There is generalized cerebral atrophy. There is periventricular white matter low attenuation likely secondary to microangiopathy. The ventricles and sulci are appropriate for the patient's age. The basal cisterns are patent. Visualized portions of the orbits are unremarkable. The visualized portions of the paranasal sinuses and  mastoid air cells are unremarkable. Cerebrovascular atherosclerotic calcifications are noted. The osseous structures are unremarkable. CT CERVICAL SPINE FINDINGS The alignment is anatomic. The vertebral body heights are maintained. There is no acute fracture. The prevertebral soft tissues are normal. The intraspinal soft tissues are not fully imaged on this examination due to poor soft tissue contrast, but there is no gross soft tissue abnormality. 4 mm anterolisthesis of C2 on C3 secondary to severe bilateral facet arthropathy. Anterior ankylosis at C3-4, C4-5 and C5-6. Ankylosis of the posterior elements of C4-5. Ankylosis of the right posterior elements at C3-4 and C4-5. Broad-based disc osteophyte complex at C4-5 and C5-6. Degenerative disc disease with disc height loss at C6-7. 5 mm of anterolisthesis of C6 on C7 secondary to severe bilateral facet arthropathy. Degenerative disc disease with disc height loss at T1-2 and T2-3 with bilateral facet arthropathy. There is biapical fibrosis. IMPRESSION: 1. No acute intracranial pathology. 2. No acute osseous injury of the cervical spine. 3. Cervical spine spondylosis as described above. Electronically Signed   By: Kathreen Devoid   On: 02/10/2016 20:16   Ct Angio Chest Pe W Or Wo Contrast  02/20/2016  CLINICAL DATA:  Hypoxia and abnormal chest x-ray. EXAM: CT ANGIOGRAPHY CHEST WITH CONTRAST TECHNIQUE: Multidetector CT imaging of the chest was performed using the standard protocol during bolus administration of intravenous contrast. Multiplanar CT image reconstructions and MIPs were obtained to evaluate the vascular anatomy. CONTRAST:  80 cc of Isovue 370 intravenously. COMPARISON:  Chest radiograph 02/20/2016 FINDINGS: Mediastinum/Lymph Nodes: No pulmonary emboli or thoracic aortic dissection identified. No masses or pathologically enlarged lymph nodes identified. There is a 3.5 by 2.1 cm fluid collection within the aortopulmonary window recess, an extension of the  superior aortic recess. This finding likely accounts for the abnormality seen radiographically. Lungs/Pleura: Biapical scarring is noted. There is focal perifissural thickening in the right lower lobe, which may also represent scarring. Mild bibasilar interstitial thickening is seen. Upper abdomen: No acute findings. Musculoskeletal: No chest wall mass or suspicious bone lesions identified. Review of the MIP images confirms the above findings. IMPRESSION: 3.5 cm fluid collection versus pericardial cyst within the aortopulmonary window recess likely accounts for the radiographically seen abnormality. Alternatively but less likely this may represent necrotic lymphadenopathy. No focal airspace consolidation. Mild cardiomegaly. Electronically Signed   By: Fidela Salisbury M.D.   On: 02/20/2016  19:53   Ct Cervical Spine Wo Contrast  02/10/2016  CLINICAL DATA:  Status post fall.  Hit head. EXAM: CT HEAD WITHOUT CONTRAST CT CERVICAL SPINE WITHOUT CONTRAST TECHNIQUE: Multidetector CT imaging of the head and cervical spine was performed following the standard protocol without intravenous contrast. Multiplanar CT image reconstructions of the cervical spine were also generated. COMPARISON:  03/05/2015 FINDINGS: CT HEAD FINDINGS There is no evidence of mass effect, midline shift, or extra-axial fluid collections. There is no evidence of a space-occupying lesion or intracranial hemorrhage. There is no evidence of a cortical-based area of acute infarction. There is generalized cerebral atrophy. There is periventricular white matter low attenuation likely secondary to microangiopathy. The ventricles and sulci are appropriate for the patient's age. The basal cisterns are patent. Visualized portions of the orbits are unremarkable. The visualized portions of the paranasal sinuses and mastoid air cells are unremarkable. Cerebrovascular atherosclerotic calcifications are noted. The osseous structures are unremarkable. CT CERVICAL  SPINE FINDINGS The alignment is anatomic. The vertebral body heights are maintained. There is no acute fracture. The prevertebral soft tissues are normal. The intraspinal soft tissues are not fully imaged on this examination due to poor soft tissue contrast, but there is no gross soft tissue abnormality. 4 mm anterolisthesis of C2 on C3 secondary to severe bilateral facet arthropathy. Anterior ankylosis at C3-4, C4-5 and C5-6. Ankylosis of the posterior elements of C4-5. Ankylosis of the right posterior elements at C3-4 and C4-5. Broad-based disc osteophyte complex at C4-5 and C5-6. Degenerative disc disease with disc height loss at C6-7. 5 mm of anterolisthesis of C6 on C7 secondary to severe bilateral facet arthropathy. Degenerative disc disease with disc height loss at T1-2 and T2-3 with bilateral facet arthropathy. There is biapical fibrosis. IMPRESSION: 1. No acute intracranial pathology. 2. No acute osseous injury of the cervical spine. 3. Cervical spine spondylosis as described above. Electronically Signed   By: Kathreen Devoid   On: 02/10/2016 20:16   Ct Pelvis Wo Contrast  02/20/2016  CLINICAL DATA:  Fall 1 week ago. Severe right hip and groin pain. Initial encounter. EXAM: CT PELVIS WITHOUT CONTRAST.  CT RIGHT HIP WITHOUT CONTRAST TECHNIQUE: Multidetector CT imaging of the pelvis and right hip was performed following the standard protocol without intravenous contrast. COMPARISON:  Left hip CT 02/10/2016 FINDINGS: Negative for pelvic ring fracture or diastasis. Ventral cortical irregularity at S3 is chronic. Bilateral total hip arthroplasty with cerclage wire on the right around the intertrochanteric femur. Both hips are located. No acute periprosthetic fracture. A lucency in the right greater trochanter is corticated and chronic. Horizontal fracture across the L4 vertebral body without displacement. There is also an acute L4 right pars fracture, nondisplaced. Lumbar levoscoliosis chronic pelvic tilting. No  acute or posttraumatic finding in the lower abdomen and pelvis. No visible acute mild tendinous disruption. IMPRESSION: 1. Nondisplaced L4 body and right pars fractures. 2. Negative for pelvic or proximal femur fracture. 3. Bilateral total hip arthroplasty. Electronically Signed   By: Monte Fantasia M.D.   On: 02/20/2016 05:49   Ct Hip Left Wo Contrast  02/10/2016  CLINICAL DATA:  Status post fall, left hip pain EXAM: CT OF THE LEFT HIP WITHOUT CONTRAST TECHNIQUE: Multidetector CT imaging of the left hip was performed according to the standard protocol. Multiplanar CT image reconstructions were also generated. COMPARISON:  None. FINDINGS: Bones/Joint/Cartilage Left total hip arthroplasty. Beam hardening artifact resulting from the arthroplasty partially obscuring the adjacent soft tissue and osseous structures. No acute fracture or  dislocation. No periarticular fluid collection. No osteolysis. Normal alignment. No aggressive lytic or sclerotic osseous lesion. Degenerative changes of the left sacroiliac joint. Muscles Normal. Soft tissue No fluid collection or hematoma. No soft tissue mass. Peripheral vascular atherosclerotic disease. IMPRESSION: 1. Left total hip arthroplasty.  No acute fracture or dislocation. Electronically Signed   By: Kathreen Devoid   On: 02/10/2016 23:57   Ct Hip Right Wo Contrast  02/20/2016  CLINICAL DATA:  Fall 1 week ago. Severe right hip and groin pain. Initial encounter. EXAM: CT PELVIS WITHOUT CONTRAST.  CT RIGHT HIP WITHOUT CONTRAST TECHNIQUE: Multidetector CT imaging of the pelvis and right hip was performed following the standard protocol without intravenous contrast. COMPARISON:  Left hip CT 02/10/2016 FINDINGS: Negative for pelvic ring fracture or diastasis. Ventral cortical irregularity at S3 is chronic. Bilateral total hip arthroplasty with cerclage wire on the right around the intertrochanteric femur. Both hips are located. No acute periprosthetic fracture. A lucency in the  right greater trochanter is corticated and chronic. Horizontal fracture across the L4 vertebral body without displacement. There is also an acute L4 right pars fracture, nondisplaced. Lumbar levoscoliosis chronic pelvic tilting. No acute or posttraumatic finding in the lower abdomen and pelvis. No visible acute mild tendinous disruption. IMPRESSION: 1. Nondisplaced L4 body and right pars fractures. 2. Negative for pelvic or proximal femur fracture. 3. Bilateral total hip arthroplasty. Electronically Signed   By: Monte Fantasia M.D.   On: 02/20/2016 05:49   Dg Chest Port 1 View  02/20/2016  CLINICAL DATA:  Hypoxia today, hx non-smoker, left breast cancer EXAM: PORTABLE CHEST 1 VIEW COMPARISON:  02/10/2016 FINDINGS: Heart is mildly enlarged but also accentuated by the technique. There is elevation of right hemidiaphragm which is stable. Perihilar peribronchial thickening is noted. There are no focal consolidations. No pleural effusions or pulmonary edema. There is convex deformity in the region the AP window common warranting further evaluation. A cystic lesion has been demonstrated in this region on previous CT exam however followup is warranted given the history of left breast cancer and melanoma. Note is made of chronic changes in both shoulders indicating probable chronic rotator cuff injuries. IMPRESSION: 1. Question of enlarged AP window region mass. CT of the chest is recommended. Contrast administration is recommended unless it is contraindicated. 2. Bronchitic changes without focal pulmonary consolidation. Electronically Signed   By: Nolon Nations M.D.   On: 02/20/2016 15:42   Dg Hand Complete Right  02/10/2016  CLINICAL DATA:  Right hand pain after fall at home today. Bruising in the fourth finger. EXAM: RIGHT HAND - COMPLETE 3+ VIEW COMPARISON:  None. FINDINGS: Diffuse bone demineralization. Degenerative changes throughout the interphalangeal joints, metacarpal phalangeal joints, carpometacarpal,  carpal, and radiocarpal joints. Deformity of the distal interphalangeal joint of the fifth finger may indicate ligamentous injury or laxity. No acute fracture or dislocation otherwise identified. IMPRESSION: Prominent degenerative changes throughout the right hand and wrist. No acute fracture or dislocation is suggested. Deformity of the fifth finger likely represents old ligamentous injury. Electronically Signed   By: Lucienne Capers M.D.   On: 02/10/2016 21:26   Dg Hip Unilat With Pelvis 2-3 Views Left  02/10/2016  CLINICAL DATA:  Left hip pain after falling in the bathroom at home today. EXAM: DG HIP (WITH OR WITHOUT PELVIS) 2-3V LEFT COMPARISON:  None. FINDINGS: Bilateral total hip prostheses. There is a small, linear avulsion fracture involving the lateral aspect of the proximal femoral shaft. Moderate levoconvex lumbar rotary scoliosis. IMPRESSION: Small,  linear avulsion fracture involving the lateral aspect of the proximal femoral shaft. This could be acute or old. Electronically Signed   By: Claudie Revering M.D.   On: 02/10/2016 19:41   Dg Hip Unilat With Pelvis 2-3 Views Right  02/13/2016  CLINICAL DATA:  Recent fall in the bathroom. Right hip pain. Prior bilateral hip arthroplasties. EXAM: DG HIP (WITH OR WITHOUT PELVIS) 2-3V RIGHT COMPARISON:  02/13/2015. FINDINGS: Prior right hip arthroplasty with anatomic alignment. Extensive myositis ossifications, unchanged. No evidence of acute fracture or dislocation. Symphysis pubis and right sacroiliac joints intact with mild degenerative changes. IMPRESSION: No acute osseous abnormality. Prior right hip arthroplasty with anatomic alignment. Electronically Signed   By: Evangeline Dakin M.D.   On: 02/13/2016 13:40   Dg Femur 1v Left  02/10/2016  CLINICAL DATA:  Possible left femur fracture by prior plain films. EXAM: SINGLE VIEW OF THE LEFT FEMUR COMPARISON:  Single view of the left femur this same day. FINDINGS: Left hip replacement is again seen. There is  a small linear focus of cortical thickening along the proximal diaphysis left femur. No fractures identified. IMPRESSION: No fracture is seen. Small focus of cortical thickening along the proximal diaphysis of the left femur will appears chronic and may be due to remote stress change. Electronically Signed   By: Inge Rise M.D.   On: 02/10/2016 21:25    (Echo, Carotid, EGD, Colonoscopy, ERCP)    Subjective:   Discharge Exam: Filed Vitals:   02/21/16 2148 02/22/16 0510  BP: 116/54 158/70  Pulse: 71 85  Temp: 98.4 F (36.9 C) 98.2 F (36.8 C)  Resp: 16 16   Filed Vitals:   02/21/16 0518 02/21/16 1418 02/21/16 2148 02/22/16 0510  BP: 132/47 143/75 116/54 158/70  Pulse: 73 86 71 85  Temp: 98.1 F (36.7 C) 98.5 F (36.9 C) 98.4 F (36.9 C) 98.2 F (36.8 C)  TempSrc: Oral Oral Oral Oral  Resp: 16 18 16 16   Height:      Weight:      SpO2: 95% 99% 96% 94%    General: Pt is alert, awake, not in acute distress Cardiovascular: RRR, S1/S2 +, no rubs, no gallops Respiratory: CTA bilaterally, no wheezing, no rhonchi Abdominal: Soft, NT, ND, bowel sounds + Extremities: no edema, no cyanosis    The results of significant diagnostics from this hospitalization (including imaging, microbiology, ancillary and laboratory) are listed below for reference.     Microbiology: No results found for this or any previous visit (from the past 240 hour(s)).   Labs: BNP (last 3 results)  Recent Labs  06/03/15 1904 02/20/16 1428  BNP 291.1* [id number]*   Basic Metabolic Panel:  Recent Labs Lab 02/20/16 0440 02/21/16 0442 02/22/16 0436  NA 135 138 138  K 5.0 4.5 4.7  CL 101 104 106  CO2 26 27 27   GLUCOSE 92 101* 97  BUN 26* 23* 24*  CREATININE 1.20* 1.14* 1.15*  CALCIUM 8.4* 8.6* 8.6*   Liver Function Tests: No results for input(s): AST, ALT, ALKPHOS, BILITOT, PROT, ALBUMIN in the last 168 hours. No results for input(s): LIPASE, AMYLASE in the last 168 hours. No results for  input(s): AMMONIA in the last 168 hours. CBC:  Recent Labs Lab 02/20/16 0440 02/21/16 0442 02/22/16 0436  WBC 8.7 7.1 8.1  NEUTROABS 6.1  --   --   HGB 7.7* 9.7* 9.5*  HCT 24.4* 30.7* 29.8*  MCV 89.4 88.7 88.4  PLT 400 310 336   Cardiac Enzymes:  No results for input(s): CKTOTAL, CKMB, CKMBINDEX, TROPONINI in the last 168 hours. BNP: Invalid input(s): POCBNP CBG: No results for input(s): GLUCAP in the last 168 hours. D-Dimer No results for input(s): DDIMER in the last 72 hours. Hgb A1c  Recent Labs  02/20/16 1021  HGBA1C 5.6   Lipid Profile No results for input(s): CHOL, HDL, LDLCALC, TRIG, CHOLHDL, LDLDIRECT in the last 72 hours. Thyroid function studies  Recent Labs  02/20/16 1021  TSH 1.511   Anemia work up  Recent Labs  02/20/16 1428  VITAMINB12 1066*  FOLATE 45.2  FERRITIN 57  TIBC 323  IRON 29  RETICCTPCT 1.3   Urinalysis    Component Value Date/Time   COLORURINE YELLOW 02/20/2016 0814   APPEARANCEUR CLOUDY* 02/20/2016 0814   LABSPEC 1.010 02/20/2016 0814   PHURINE 7.0 02/20/2016 0814   GLUCOSEU NEGATIVE 02/20/2016 0814   HGBUR SMALL* 02/20/2016 0814   BILIRUBINUR NEGATIVE 02/20/2016 0814   KETONESUR NEGATIVE 02/20/2016 0814   PROTEINUR NEGATIVE 02/20/2016 0814   UROBILINOGEN 0.2 02/13/2015 0522   NITRITE NEGATIVE 02/20/2016 0814   LEUKOCYTESUR MODERATE* 02/20/2016 0814   Sepsis Labs Invalid input(s): PROCALCITONIN,  WBC,  LACTICIDVEN Microbiology No results found for this or any previous visit (from the past 240 hour(s)).   Time coordinating discharge: Over 30 minutes  SIGNED:   Birdie Hopes, MD  [facility] 02/22/2016, 9:15 AM Pager   If 7PM-7AM, please contact night-coverage [email or web address] Password [email or web address]

## 2016-02-22 NOTE — Clinical Social Work Placement (Signed)
Patient is set to discharge to [facility] today. Patient & daughter, Patty Walters at bedside made aware. Discharge packet given to RN, Anderson Malta. [facility] called for transport to pickup at 4:00pm.     [name], [place] Social Worker cell #: [phone]    CLINICAL SOCIAL WORK PLACEMENT  NOTE  Date:  02/22/2016  Patient Details  Name: Patty Walters MRN: [id number] Date of Birth: 10/24/1921  Clinical Social Work is seeking post-discharge placement for this patient at the [place] level of care (*CSW will initial, date and re-position this form in  chart as items are completed):  Yes   Patient/family provided with [facility] Work Department's list of facilities offering this level of care within the geographic area requested by the patient (or if unable, by the patient's family).  Yes   Patient/family informed of their freedom to choose among providers that offer the needed level of care, that participate in Medicare, Medicaid or managed care program needed by the patient, have an available bed and are willing to accept the patient.  Yes   Patient/family informed of [facility]'s ownership interest in [facility] and [facility], as well as of the fact that they are under no obligation to receive care at these facilities.  PASRR submitted to EDS on 02/21/16     PASRR number received on 02/21/16     Existing PASRR number confirmed on       FL2 transmitted to all facilities in geographic area requested by pt/family on 02/21/16     FL2 transmitted to all facilities within larger geographic area on       Patient informed that his/her managed care company has contracts with or will negotiate with certain facilities, including the following:        Yes   Patient/family informed of bed offers received.  Patient chooses bed at [facility]     Physician recommends and patient chooses bed at       Patient to be transferred to [facility] on 02/22/16.  Patient to be transferred to facility by [facility]     Patient family notified on 02/22/16 of transfer.  Name of family member notified:  patient's daughter, Patty Walters at bedside     PHYSICIAN       Additional Comment:    _______________________________________________ Standley Brooking, LCSW 02/22/2016, 1:00 PM

## 2016-02-23 DIAGNOSIS — M6281 Muscle weakness (generalized): Secondary | ICD-10-CM | POA: Diagnosis not present

## 2016-02-23 DIAGNOSIS — R600 Localized edema: Secondary | ICD-10-CM | POA: Diagnosis not present

## 2016-02-23 DIAGNOSIS — R5381 Other malaise: Secondary | ICD-10-CM | POA: Diagnosis not present

## 2016-02-23 DIAGNOSIS — F329 Major depressive disorder, single episode, unspecified: Secondary | ICD-10-CM | POA: Diagnosis not present

## 2016-02-23 DIAGNOSIS — R938 Abnormal findings on diagnostic imaging of other specified body structures: Secondary | ICD-10-CM | POA: Diagnosis not present

## 2016-02-23 DIAGNOSIS — R262 Difficulty in walking, not elsewhere classified: Secondary | ICD-10-CM | POA: Diagnosis not present

## 2016-02-23 DIAGNOSIS — D649 Anemia, unspecified: Secondary | ICD-10-CM | POA: Diagnosis not present

## 2016-02-23 DIAGNOSIS — R531 Weakness: Secondary | ICD-10-CM | POA: Diagnosis not present

## 2016-02-23 DIAGNOSIS — S32048S Other fracture of fourth lumbar vertebra, sequela: Secondary | ICD-10-CM | POA: Diagnosis not present

## 2016-02-23 DIAGNOSIS — M069 Rheumatoid arthritis, unspecified: Secondary | ICD-10-CM | POA: Diagnosis not present

## 2016-02-23 DIAGNOSIS — M25551 Pain in right hip: Secondary | ICD-10-CM | POA: Diagnosis not present

## 2016-02-23 DIAGNOSIS — M545 Low back pain: Secondary | ICD-10-CM | POA: Diagnosis not present

## 2016-02-23 DIAGNOSIS — M549 Dorsalgia, unspecified: Secondary | ICD-10-CM | POA: Diagnosis not present

## 2016-02-23 DIAGNOSIS — F1120 Opioid dependence, uncomplicated: Secondary | ICD-10-CM | POA: Diagnosis not present

## 2016-02-23 DIAGNOSIS — Z9181 History of falling: Secondary | ICD-10-CM | POA: Diagnosis not present

## 2016-02-23 DIAGNOSIS — S32049D Unspecified fracture of fourth lumbar vertebra, subsequent encounter for fracture with routine healing: Secondary | ICD-10-CM | POA: Diagnosis not present

## 2016-02-23 DIAGNOSIS — N183 Chronic kidney disease, stage 3 (moderate): Secondary | ICD-10-CM | POA: Diagnosis not present

## 2016-02-23 DIAGNOSIS — Z4789 Encounter for other orthopedic aftercare: Secondary | ICD-10-CM | POA: Diagnosis not present

## 2016-02-23 DIAGNOSIS — G8929 Other chronic pain: Secondary | ICD-10-CM | POA: Diagnosis not present

## 2016-02-23 DIAGNOSIS — K5900 Constipation, unspecified: Secondary | ICD-10-CM | POA: Diagnosis not present

## 2016-02-23 DIAGNOSIS — S32009A Unspecified fracture of unspecified lumbar vertebra, initial encounter for closed fracture: Secondary | ICD-10-CM | POA: Diagnosis not present

## 2016-02-23 DIAGNOSIS — D638 Anemia in other chronic diseases classified elsewhere: Secondary | ICD-10-CM | POA: Diagnosis not present

## 2016-02-23 DIAGNOSIS — R2681 Unsteadiness on feet: Secondary | ICD-10-CM | POA: Diagnosis not present

## 2016-02-23 DIAGNOSIS — S329XXA Fracture of unspecified parts of lumbosacral spine and pelvis, initial encounter for closed fracture: Secondary | ICD-10-CM | POA: Diagnosis not present

## 2016-02-23 DIAGNOSIS — S32049A Unspecified fracture of fourth lumbar vertebra, initial encounter for closed fracture: Secondary | ICD-10-CM | POA: Diagnosis not present

## 2016-02-23 DIAGNOSIS — Z5189 Encounter for other specified aftercare: Secondary | ICD-10-CM | POA: Diagnosis not present

## 2016-02-23 LAB — CBC
HEMATOCRIT: 30.5 % — AB (ref 36.0–46.0)
Hemoglobin: 9.4 g/dL — ABNORMAL LOW (ref 12.0–15.0)
MCH: 28 pg (ref 26.0–34.0)
MCHC: 30.8 g/dL (ref 30.0–36.0)
MCV: 90.8 fL (ref 78.0–100.0)
PLATELETS: 331 10*3/uL (ref 150–400)
RBC: 3.36 MIL/uL — ABNORMAL LOW (ref 3.87–5.11)
RDW: 14 % (ref 11.5–15.5)
WBC: 8.7 10*3/uL (ref 4.0–10.5)

## 2016-02-23 LAB — BASIC METABOLIC PANEL
Anion gap: 6 (ref 5–15)
BUN: 27 mg/dL — AB (ref 6–20)
CHLORIDE: 105 mmol/L (ref 101–111)
CO2: 27 mmol/L (ref 22–32)
CREATININE: 1.2 mg/dL — AB (ref 0.44–1.00)
Calcium: 8.8 mg/dL — ABNORMAL LOW (ref 8.9–10.3)
GFR calc Af Amer: 44 mL/min — ABNORMAL LOW (ref >60)
GFR, EST NON AFRICAN AMERICAN: 38 mL/min — AB (ref >60)
GLUCOSE: 97 mg/dL (ref 65–99)
POTASSIUM: 5.3 mmol/L — AB (ref 3.5–5.1)
SODIUM: 138 mmol/L (ref 135–145)

## 2016-02-23 MED ORDER — IBUPROFEN 400 MG PO TABS
600.0000 mg | ORAL_TABLET | Freq: Once | ORAL | Status: AC
  Administered 2016-02-23: 600 mg via ORAL
  Filled 2016-02-23: qty 1

## 2016-02-23 NOTE — Progress Notes (Addendum)
PROGRESS NOTE  Patty Walters  [id number] DOB: [birth year] DOA: 02/20/2016 PCP: Sharon Seller, NP Outpatient Specialists:  Subjective: Seen with her daughter at bedside, does not complain about pain. Patient was supposed to be discharged yesterday, discharged was postponed because patient developed extreme pain after she had bowel movement. Pain was controlled with oral pain medicines and she had to get IV pain medications.  Brief Narrative:  Patty Walters is a 82 y.o. female with medical history significant of rheumatoid arthritis, history of melanoma and recurrent UTIs given to the hospital complains about severe hip pain. Patient was started on 7/10 when she fell, she was presented to the emergency department, x-rays of the hips did not show any bony abnormalities, she even so Dr. Shon Baton as outpatient. CT scan was ordered for the left hip did not show any abnormalities. She was started on OxyContin 10 mg, was not able to get the medication until last night. Patient kept having pain so she came into the hospital for further evaluation  Assessment & Plan:   Principal Problem:   Lumbar vertebral fracture (HCC) Active Problems:   Rheumatoid arthritis (HCC)   Chronic anemia   Constipation   Edema   L4 Lumbar vertebral fracture -Recent unwitnessed fall, worked on 7/10 in the ED, came back to the hospital because of persist pain -CT scan showed nondisplaced L4 body and right pars fractures. -Patient admitted for pain control, she is already started on OxyContin 10 mg last night and Percocet. -No changes in medications. Discharge to skilled nursing facility  Abnormal CT of the chest with findings of 3.5 cm lesion -X-ray of the chest was done because of transient hypoxia in the ED, showed questionable mass, has history of melanoma. -CT angio of the chest was obtained and showed 3.5 fluid versus pericardial cyst. -Per radiology this is unlikely to be necrotic lymph node or  metastasis, discussed with both daughters. -One of the daughters is advanced Publishing rights manager, Mrs. need follow-up in 2-3 months with CT scan.  Rheumatoid arthritis -Patient is on Plaquenil, continued, per daughter at bedside she takes Percocet at home.  Acute on chronic anemia -Presented with hemoglobin of 7.7, baseline was 11.5 about 10 days ago, no evidence of overt bleeding. -This is probably variation because of fluids status, hemoglobin is 9.7 today without any interventions. -Has chronic anemia of chronic disease secondary to rheumatoid arthritis  Lower extremity edema -Per daughter this is chronic, patient is on Lasix 20 mg orally. -Given 1 dose of Lasix.  Transient hypoxia -SPO2 1 down to 88% on room air, place and 1 L of oxygen. Patient has lower extremity edema. -I will check BNP and chest x-ray, will give 1 dose of IV Lasix. She might need more Lasix pending on the CXR and BMP. -Daughter the side reported her lower extremity edema is chronic but cannot rule out chronic CHF with mild exacerbation.  History of melanoma -Status post resection  DVT prophylaxis: Subcutaneous heparin Code Status: DNR Family Communication: D/W with daughter [name] (POA). Disposition Plan:  Diet: Diet Heart Room service appropriate?: Yes; Fluid consistency:: Thin Diet - low sodium heart healthy  Consultants:   None  Procedures:   None  Antimicrobials:   None  Objective: Vitals:   02/22/16 0510 02/22/16 1536 02/22/16 2130 02/23/16 0440  BP: (!) 158/70 (!) 107/56 120/62 (!) 139/53  Pulse: 85 81 68 77  Resp: 16 18 18 15   Temp: 98.2 F (36.8 C) 98.3 F (36.8 C) 98.3 F (  36.8 C) 98.2 F (36.8 C)  TempSrc: Oral Axillary Oral Oral  SpO2: 94% 93% 100% 95%  Weight:      Height:        Intake/Output Summary (Last 24 hours) at 02/23/16 0948 Last data filed at 02/23/16 0440  Gross per 24 hour  Intake              600 ml  Output                0 ml  Net              600 ml    Filed Weights   02/20/16 0902  Weight: 63.5 kg (140 lb)    Examination: General exam: Appears calm and comfortable  Respiratory system: Clear to auscultation. Respiratory effort normal. Cardiovascular system: S1 & S2 heard, RRR. No JVD, murmurs, rubs, gallops or clicks. No pedal edema. Gastrointestinal system: Abdomen is nondistended, soft and nontender. No organomegaly or masses felt. Normal bowel sounds heard. Central nervous system: Alert and oriented. No focal neurological deficits. Extremities: Symmetric 5 x 5 power. Skin: No rashes, lesions or ulcers Psychiatry: Judgement and insight appear normal. Mood & affect appropriate.   Data Reviewed: I have personally reviewed following labs and imaging studies  CBC:  Recent Labs Lab 02/20/16 0440 02/21/16 0442 02/22/16 0436 02/23/16 0416  WBC 8.7 7.1 8.1 8.7  NEUTROABS 6.1  --   --   --   HGB 7.7* 9.7* 9.5* 9.4*  HCT 24.4* 30.7* 29.8* 30.5*  MCV 89.4 88.7 88.4 90.8  PLT 400 310 336 331   Basic Metabolic Panel:  Recent Labs Lab 02/20/16 0440 02/21/16 0442 02/22/16 0436 02/23/16 0416  NA 135 138 138 138  K 5.0 4.5 4.7 5.3*  CL 101 104 106 105  CO2 26 27 27 27   GLUCOSE 92 101* 97 97  BUN 26* 23* 24* 27*  CREATININE 1.20* 1.14* 1.15* 1.20*  CALCIUM 8.4* 8.6* 8.6* 8.8*   GFR: Estimated Creatinine Clearance: 25.8 mL/min (by C-G formula based on SCr of 1.2 mg/dL). Liver Function Tests: No results for input(s): AST, ALT, ALKPHOS, BILITOT, PROT, ALBUMIN in the last 168 hours. No results for input(s): LIPASE, AMYLASE in the last 168 hours. No results for input(s): AMMONIA in the last 168 hours. Coagulation Profile:  Recent Labs Lab 02/20/16 1021  INR 1.06   Cardiac Enzymes: No results for input(s): CKTOTAL, CKMB, CKMBINDEX, TROPONINI in the last 168 hours. BNP (last 3 results) No results for input(s): PROBNP in the last 8760 hours. HbA1C:  Recent Labs  02/20/16 1021  HGBA1C 5.6   CBG: No results for  input(s): GLUCAP in the last 168 hours. Lipid Profile: No results for input(s): CHOL, HDL, LDLCALC, TRIG, CHOLHDL, LDLDIRECT in the last 72 hours. Thyroid Function Tests:  Recent Labs  02/20/16 1021  TSH 1.511   Anemia Panel:  Recent Labs  02/20/16 1428  VITAMINB12 1,066*  FOLATE 45.2  FERRITIN 57  TIBC 323  IRON 29  RETICCTPCT 1.3   Urine analysis:    Component Value Date/Time   COLORURINE YELLOW 02/20/2016 0814   APPEARANCEUR CLOUDY (A) 02/20/2016 0814   LABSPEC 1.010 02/20/2016 0814   PHURINE 7.0 02/20/2016 0814   GLUCOSEU NEGATIVE 02/20/2016 0814   HGBUR SMALL (A) 02/20/2016 0814   BILIRUBINUR NEGATIVE 02/20/2016 0814   KETONESUR NEGATIVE 02/20/2016 0814   PROTEINUR NEGATIVE 02/20/2016 0814   UROBILINOGEN 0.2 02/13/2015 0522   NITRITE NEGATIVE 02/20/2016 0814   LEUKOCYTESUR MODERATE (  A) 02/20/2016 0814   Sepsis Labs: @LABRCNTIP (procalcitonin:4,lacticidven:4)  )No results found for this or any previous visit (from the past 240 hour(s)).   Invalid input(s): PROCALCITONIN, LACTICACIDVEN   Radiology Studies: No results found.      Scheduled Meds: . DULoxetine  60 mg Oral Daily  . heparin  5,000 Units Subcutaneous Q8H  . hydroxychloroquine  200 mg Oral Daily  . multivitamin with minerals  1 tablet Oral Daily  . oxyCODONE  15 mg Oral Q12H  . polyethylene glycol  17 g Oral Daily  . senna  2 tablet Oral QHS   Continuous Infusions: . sodium chloride 10 mL/hr at 02/20/16 1103     LOS: 0 days    Time spent: 15 minutes    Austen Wygant A, MD [facility] Pager [phone]  If 7PM-7AM, please contact night-coverage [email or web address] Password [facility] 02/23/2016, 9:48 AM

## 2016-02-23 NOTE — Clinical Social Work Note (Addendum)
Patient will discharge today per MD order. Patient will discharge to: [facility] SN[facility] RN to call report prior to transportation to: [phone] Transportation: [facility]- to be scheduled for 1pm per SNF- scheduled [id number])  CSW sent discharge summary to SNF for review.  RN, patient and daughter [name] aware of discharge plans.  Nonnie Done, LCSW [phone] [facility] Weekend Coverage Licensed Clinical Social Worker

## 2016-02-23 NOTE — Progress Notes (Signed)
Pt has redness and warmth just below her abrasion on her right lower leg. Rash also. Per daughter MD aware of the wound.  Will continue to monitor.

## 2016-02-23 NOTE — Clinical Social Work Placement (Signed)
   CLINICAL SOCIAL WORK PLACEMENT  NOTE  Date:  02/23/2016  Patient Details  Name: ROGENIA LIMON MRN: [id number] Date of Birth: 08-Dec-1921  Clinical Social Work is seeking post-discharge placement for this patient at the [place] level of care (*CSW will initial, date and re-position this form in  chart as items are completed):  Yes   Patient/family provided with [facility] Work Department's list of facilities offering this level of care within the geographic area requested by the patient (or if unable, by the patient's family).  Yes   Patient/family informed of their freedom to choose among providers that offer the needed level of care, that participate in Medicare, Medicaid or managed care program needed by the patient, have an available bed and are willing to accept the patient.  Yes   Patient/family informed of [facility]'s ownership interest in [facility] and [facility], as well as of the fact that they are under no obligation to receive care at these facilities.  PASRR submitted to EDS on 02/21/16     PASRR number received on 02/21/16     Existing PASRR number confirmed on       FL2 transmitted to all facilities in geographic area requested by pt/family on 02/21/16     FL2 transmitted to all facilities within larger geographic area on       Patient informed that his/her managed care company has contracts with or will negotiate with certain facilities, including the following:        Yes   Patient/family informed of bed offers received.  Patient chooses bed at [facility]     Physician recommends and patient chooses bed at      Patient to be transferred to [facility] on 02/23/16.  Patient to be transferred to facility by [facility]     Patient family notified on 02/23/16 of transfer.  Name of family member notified:  patient's daughter, [name] at bedside     PHYSICIAN       Additional Comment:     _______________________________________________ Dulcy Fanny, LCSW 02/23/2016, 10:01 AM

## 2016-02-23 NOTE — Progress Notes (Signed)
Report called to [facility], LPN.  Questions answered.  EMS to transport [phone]. Daughter at bedside aware of the above

## 2016-02-23 NOTE — Discharge Summary (Signed)
Physician Discharge Summary  Patty Walters [id number] DOB: 06-Jun-1922 DOA: 02/20/2016  PCP: [name], NP  Admit date: 02/20/2016 Discharge date: 02/23/2016  Admitted From: Home Disposition: SNF  Recommendations for Outpatient Follow-up:  1. Follow up with PCP in 1-2 weeks 2. Please obtain BMP/CBC in one week 3. Continue PT/OT  Home Health: No Equipment/Devices: NA  Discharge Condition: Stable CODE STATUS: DNR/DNI Diet recommendation: Heart Healthy  Brief/Interim Summary: Patty Walters is a 82 y.o. female with medical history significant of rheumatoid arthritis, history of melanoma and recurrent UTIs given to the hospital complains about severe hip pain. Patient was started on 7/10 when she fell, she was presented to the emergency department, x-rays of the hips did not show any bony abnormalities, she even so [name] as outpatient. CT scan was ordered for the left hip did not show any abnormalities. She was started on OxyContin 10 mg, was not able to get the medication until last night. Patient kept having pain so she came into the hospital for further evaluation  Discharge Diagnoses:  Principal Problem:   Lumbar vertebral fracture (HCC) Active Problems:   Rheumatoid arthritis ([place])   Chronic anemia   Constipation   Edema    L4 Lumbar vertebral fracture -Recent unwitnessed fall, on 7/10, evaluated back then in the ED but came back in the hospital because of persistent pain. -CT scan showed nondisplaced L4 body and right pars fractures. -Patient admitted for pain control, she is already started on OxyContin 10 mg last night and Percocet. -IV morphine for breakthrough pain. -Pain is better while she is in bed, still complaining about severe pain with movement. -PT/OT are recommending skilled nursing facility. -Pain is controlled now with 15 mg of OxyContin and Percocet as needed, monitor sedation. Bowel regimen started.  Abnormal CT of the chest with findings  of 3.5 cm lesion -X-ray of the chest was done because of transient hypoxia in the ED, showed questionable mass, has history of melanoma. -CT angio of the chest was obtained and showed 3.5 fluid versus pericardial cyst. -Per radiology this is less likely to be necrotic lymph node or metastasis, discussed with both daughters. -One of the daughters is advanced Designer, jewellery, Mrs. need follow-up in 2-3 months with CT scan.  Rheumatoid arthritis -Patient is on Plaquenil, continued, per daughter at bedside she always had some pain at baseline.  Acute on chronic anemia -Presented with hemoglobin of 7.7, baseline was 11.5 about 10 days ago, no evidence of overt bleeding. -This is probably variation because of fluids status, hemoglobin is 9.7 today without any interventions. -Has chronic anemia of chronic disease secondary to rheumatoid arthritis  Lower extremity edema -Per daughter this is chronic, patient is on Lasix 20 mg orally. -Given 1 dose of Lasix. Has abrasion on the right shin, Neosporin prescribed on discharge.  Transient hypoxia -SPO2 1 down to 88% on room air, place and 1 L of oxygen. Patient has lower extremity edema. -BNP was slightly elevated, chest x-ray did not show pulmonary edema, given 1 dose of Lasix. -Not sure if this just fluid retention from CKD. or has history of CHF no recent echo. Cannot clinically determine.  History of melanoma -Status post resection  CKD stage 2-3 -Baseline creatinine around 1.1, this is re-present CKD stage 2-3.  Discharge Instructions  Discharge Instructions    Diet - low sodium heart healthy    Complete by:  As directed   Increase activity slowly    Complete by:  As  directed       Medication List    STOP taking these medications   docusate sodium 100 MG capsule Commonly known as:  COLACE     TAKE these medications   barrier cream Crea Commonly known as:  non-specified Apply 1 application topically 2 (two) times daily as  needed (To prevent bedsores.).   [employer] PAIN RELIEF EXTRA STRENGTH 500 MG tablet Generic drug:  acetaminophen Take 500-1,000 mg by mouth every 6 (six) hours as needed (For breakthrough pain.).   DULoxetine 60 MG capsule Commonly known as:  CYMBALTA TAKE 1 CAPSULE (60 MG TOTAL) BY MOUTH DAILY.   furosemide 20 MG tablet Commonly known as:  LASIX Take 1 tablet (20 mg total) by mouth daily as needed for edema.   hydroxychloroquine 200 MG tablet Commonly known as:  PLAQUENIL TAKE 1 TABLET (200 MG TOTAL) BY MOUTH DAILY.   IMLYGIC 1000000 UNIT/ML Susp Generic drug:  Talimogene Laherparepvec 1 each by Intralesional route every 14 (fourteen) days.   lidocaine-prilocaine cream Commonly known as:  EMLA Apply 1 application topically as needed (Apply to lesions undergoing injection 30 mins prior to treatment.).   magnesium hydroxide 800 MG/5ML suspension Commonly known as:  MILK OF MAGNESIA Take 15 mLs by mouth daily.   multivitamin tablet Take 1 tablet by mouth daily.   neomycin-bacitracin-polymyxin [phone] ointment Apply topically 3 (three) times daily. Right shin abrasion   nystatin powder Generic drug:  nystatin Twice daily as needed to affected area What changed:  how much to take  how to take this  when to take this  reasons to take this  additional instructions   OVER THE COUNTER MEDICATION Place 2-3 drops into both eyes as needed (For eye irritation.). [employer] Eye Allergy Relief Eye Drops   oxyCODONE 15 mg 12 hr tablet Commonly known as:  OXYCONTIN Take 1 tablet (15 mg total) by mouth every 12 (twelve) hours. What changed:  medication strength  how much to take  how to take this  when to take this  additional instructions   oxyCODONE-acetaminophen 5-325 MG tablet Commonly known as:  PERCOCET Take 1-2 tablets by mouth every 6 (six) hours as needed for severe pain.   polyethylene glycol packet Commonly known as:  MIRALAX / GLYCOLAX Take 17 g by mouth  daily. What changed:  when to take this  reasons to take this   prochlorperazine 10 MG tablet Commonly known as:  COMPAZINE Take 10 mg by mouth every 6 (six) hours as needed for nausea or vomiting.   senna 8.6 MG tablet Commonly known as:  SENOKOT Take 2 tablets (17.2 mg total) by mouth daily. Hold if has diarrhea or loose stools What changed:  when to take this  reasons to take this  additional instructions       Contact information for follow-up providers    [facility].   Why:  home health services Contact information: [place].   Why:  hospital bed and wheelchair with cushion Contact information: [address] [place] [address] [phone]            Contact information for after-discharge care    Destination    [facility] .   Specialty:  [place] Contact information: [place] [phone]                 No Known Allergies  Consultations:  None   Procedures/Studies: Dg Chest 2 View  Result Date: 02/10/2016 CLINICAL DATA:  Status post fall today.  Left hip pain. EXAM: CHEST  2 VIEW COMPARISON:  CT chest and PA and lateral chest 06/03/2015. FINDINGS: The lungs are clear. Heart size is upper normal. No pneumothorax or pleural effusion. Marked degenerative change about the shoulders is noted. IMPRESSION: No acute disease. Electronically Signed   By: Inge Rise M.D.   On: 02/10/2016 19:39   Ct Head Wo Contrast  Result Date: 02/10/2016 CLINICAL DATA:  Status post fall.  Hit head. EXAM: CT HEAD WITHOUT CONTRAST CT CERVICAL SPINE WITHOUT CONTRAST TECHNIQUE: Multidetector CT imaging of the head and cervical spine was performed following the standard protocol without intravenous contrast. Multiplanar CT image reconstructions of the cervical spine were also generated. COMPARISON:  03/05/2015 FINDINGS: CT HEAD FINDINGS  There is no evidence of mass effect, midline shift, or extra-axial fluid collections. There is no evidence of a space-occupying lesion or intracranial hemorrhage. There is no evidence of a cortical-based area of acute infarction. There is generalized cerebral atrophy. There is periventricular white matter low attenuation likely secondary to microangiopathy. The ventricles and sulci are appropriate for the patient's age. The basal cisterns are patent. Visualized portions of the orbits are unremarkable. The visualized portions of the paranasal sinuses and mastoid air cells are unremarkable. Cerebrovascular atherosclerotic calcifications are noted. The osseous structures are unremarkable. CT CERVICAL SPINE FINDINGS The alignment is anatomic. The vertebral body heights are maintained. There is no acute fracture. The prevertebral soft tissues are normal. The intraspinal soft tissues are not fully imaged on this examination due to poor soft tissue contrast, but there is no gross soft tissue abnormality. 4 mm anterolisthesis of C2 on C3 secondary to severe bilateral facet arthropathy. Anterior ankylosis at C3-4, C4-5 and C5-6. Ankylosis of the posterior elements of C4-5. Ankylosis of the right posterior elements at C3-4 and C4-5. Broad-based disc osteophyte complex at C4-5 and C5-6. Degenerative disc disease with disc height loss at C6-7. 5 mm of anterolisthesis of C6 on C7 secondary to severe bilateral facet arthropathy. Degenerative disc disease with disc height loss at T1-2 and T2-3 with bilateral facet arthropathy. There is biapical fibrosis. IMPRESSION: 1. No acute intracranial pathology. 2. No acute osseous injury of the cervical spine. 3. Cervical spine spondylosis as described above. Electronically Signed   By: Kathreen Devoid   On: 02/10/2016 20:16   Ct Angio Chest Pe W Or Wo Contrast  Result Date: 02/20/2016 CLINICAL DATA:  Hypoxia and abnormal chest x-ray. EXAM: CT ANGIOGRAPHY CHEST WITH CONTRAST TECHNIQUE:  Multidetector CT imaging of the chest was performed using the standard protocol during bolus administration of intravenous contrast. Multiplanar CT image reconstructions and MIPs were obtained to evaluate the vascular anatomy. CONTRAST:  80 cc of Isovue 370 intravenously. COMPARISON:  Chest radiograph 02/20/2016 FINDINGS: Mediastinum/Lymph Nodes: No pulmonary emboli or thoracic aortic dissection identified. No masses or pathologically enlarged lymph nodes identified. There is a 3.5 by 2.1 cm fluid collection within the aortopulmonary window recess, an extension of the superior aortic recess. This finding likely accounts for the abnormality seen radiographically. Lungs/Pleura: Biapical scarring is noted. There is focal perifissural thickening in the right lower lobe, which may also represent scarring. Mild bibasilar interstitial thickening is seen. Upper abdomen: No acute findings. Musculoskeletal: No chest wall mass or suspicious bone lesions identified. Review of the MIP images confirms the above findings. IMPRESSION: 3.5 cm fluid collection versus pericardial cyst within the aortopulmonary window recess  likely accounts for the radiographically seen abnormality. Alternatively but less likely this may represent necrotic lymphadenopathy. No focal airspace consolidation. Mild cardiomegaly. Electronically Signed   By: Fidela Salisbury M.D.   On: 02/20/2016 19:53   Ct Cervical Spine Wo Contrast  Result Date: 02/10/2016 CLINICAL DATA:  Status post fall.  Hit head. EXAM: CT HEAD WITHOUT CONTRAST CT CERVICAL SPINE WITHOUT CONTRAST TECHNIQUE: Multidetector CT imaging of the head and cervical spine was performed following the standard protocol without intravenous contrast. Multiplanar CT image reconstructions of the cervical spine were also generated. COMPARISON:  03/05/2015 FINDINGS: CT HEAD FINDINGS There is no evidence of mass effect, midline shift, or extra-axial fluid collections. There is no evidence of a  space-occupying lesion or intracranial hemorrhage. There is no evidence of a cortical-based area of acute infarction. There is generalized cerebral atrophy. There is periventricular white matter low attenuation likely secondary to microangiopathy. The ventricles and sulci are appropriate for the patient's age. The basal cisterns are patent. Visualized portions of the orbits are unremarkable. The visualized portions of the paranasal sinuses and mastoid air cells are unremarkable. Cerebrovascular atherosclerotic calcifications are noted. The osseous structures are unremarkable. CT CERVICAL SPINE FINDINGS The alignment is anatomic. The vertebral body heights are maintained. There is no acute fracture. The prevertebral soft tissues are normal. The intraspinal soft tissues are not fully imaged on this examination due to poor soft tissue contrast, but there is no gross soft tissue abnormality. 4 mm anterolisthesis of C2 on C3 secondary to severe bilateral facet arthropathy. Anterior ankylosis at C3-4, C4-5 and C5-6. Ankylosis of the posterior elements of C4-5. Ankylosis of the right posterior elements at C3-4 and C4-5. Broad-based disc osteophyte complex at C4-5 and C5-6. Degenerative disc disease with disc height loss at C6-7. 5 mm of anterolisthesis of C6 on C7 secondary to severe bilateral facet arthropathy. Degenerative disc disease with disc height loss at T1-2 and T2-3 with bilateral facet arthropathy. There is biapical fibrosis. IMPRESSION: 1. No acute intracranial pathology. 2. No acute osseous injury of the cervical spine. 3. Cervical spine spondylosis as described above. Electronically Signed   By: Kathreen Devoid   On: 02/10/2016 20:16   Ct Pelvis Wo Contrast  Result Date: 02/20/2016 CLINICAL DATA:  Fall 1 week ago. Severe right hip and groin pain. Initial encounter. EXAM: CT PELVIS WITHOUT CONTRAST.  CT RIGHT HIP WITHOUT CONTRAST TECHNIQUE: Multidetector CT imaging of the pelvis and right hip was performed  following the standard protocol without intravenous contrast. COMPARISON:  Left hip CT 02/10/2016 FINDINGS: Negative for pelvic ring fracture or diastasis. Ventral cortical irregularity at S3 is chronic. Bilateral total hip arthroplasty with cerclage wire on the right around the intertrochanteric femur. Both hips are located. No acute periprosthetic fracture. A lucency in the right greater trochanter is corticated and chronic. Horizontal fracture across the L4 vertebral body without displacement. There is also an acute L4 right pars fracture, nondisplaced. Lumbar levoscoliosis chronic pelvic tilting. No acute or posttraumatic finding in the lower abdomen and pelvis. No visible acute mild tendinous disruption. IMPRESSION: 1. Nondisplaced L4 body and right pars fractures. 2. Negative for pelvic or proximal femur fracture. 3. Bilateral total hip arthroplasty. Electronically Signed   By: Monte Fantasia M.D.   On: 02/20/2016 05:49   Ct Hip Left Wo Contrast  Result Date: 02/10/2016 CLINICAL DATA:  Status post fall, left hip pain EXAM: CT OF THE LEFT HIP WITHOUT CONTRAST TECHNIQUE: Multidetector CT imaging of the left hip was performed according to the  standard protocol. Multiplanar CT image reconstructions were also generated. COMPARISON:  None. FINDINGS: Bones/Joint/Cartilage Left total hip arthroplasty. Beam hardening artifact resulting from the arthroplasty partially obscuring the adjacent soft tissue and osseous structures. No acute fracture or dislocation. No periarticular fluid collection. No osteolysis. Normal alignment. No aggressive lytic or sclerotic osseous lesion. Degenerative changes of the left sacroiliac joint. Muscles Normal. Soft tissue No fluid collection or hematoma. No soft tissue mass. Peripheral vascular atherosclerotic disease. IMPRESSION: 1. Left total hip arthroplasty.  No acute fracture or dislocation. Electronically Signed   By: Kathreen Devoid   On: 02/10/2016 23:57   Ct Hip Right Wo  Contrast  Result Date: 02/20/2016 CLINICAL DATA:  Fall 1 week ago. Severe right hip and groin pain. Initial encounter. EXAM: CT PELVIS WITHOUT CONTRAST.  CT RIGHT HIP WITHOUT CONTRAST TECHNIQUE: Multidetector CT imaging of the pelvis and right hip was performed following the standard protocol without intravenous contrast. COMPARISON:  Left hip CT 02/10/2016 FINDINGS: Negative for pelvic ring fracture or diastasis. Ventral cortical irregularity at S3 is chronic. Bilateral total hip arthroplasty with cerclage wire on the right around the intertrochanteric femur. Both hips are located. No acute periprosthetic fracture. A lucency in the right greater trochanter is corticated and chronic. Horizontal fracture across the L4 vertebral body without displacement. There is also an acute L4 right pars fracture, nondisplaced. Lumbar levoscoliosis chronic pelvic tilting. No acute or posttraumatic finding in the lower abdomen and pelvis. No visible acute mild tendinous disruption. IMPRESSION: 1. Nondisplaced L4 body and right pars fractures. 2. Negative for pelvic or proximal femur fracture. 3. Bilateral total hip arthroplasty. Electronically Signed   By: Monte Fantasia M.D.   On: 02/20/2016 05:49   Dg Chest Port 1 View  Result Date: 02/20/2016 CLINICAL DATA:  Hypoxia today, hx non-smoker, left breast cancer EXAM: PORTABLE CHEST 1 VIEW COMPARISON:  02/10/2016 FINDINGS: Heart is mildly enlarged but also accentuated by the technique. There is elevation of right hemidiaphragm which is stable. Perihilar peribronchial thickening is noted. There are no focal consolidations. No pleural effusions or pulmonary edema. There is convex deformity in the region the AP window common warranting further evaluation. A cystic lesion has been demonstrated in this region on previous CT exam however followup is warranted given the history of left breast cancer and melanoma. Note is made of chronic changes in both shoulders indicating probable  chronic rotator cuff injuries. IMPRESSION: 1. Question of enlarged AP window region mass. CT of the chest is recommended. Contrast administration is recommended unless it is contraindicated. 2. Bronchitic changes without focal pulmonary consolidation. Electronically Signed   By: Nolon Nations M.D.   On: 02/20/2016 15:42   Dg Hand Complete Right  Result Date: 02/10/2016 CLINICAL DATA:  Right hand pain after fall at home today. Bruising in the fourth finger. EXAM: RIGHT HAND - COMPLETE 3+ VIEW COMPARISON:  None. FINDINGS: Diffuse bone demineralization. Degenerative changes throughout the interphalangeal joints, metacarpal phalangeal joints, carpometacarpal, carpal, and radiocarpal joints. Deformity of the distal interphalangeal joint of the fifth finger may indicate ligamentous injury or laxity. No acute fracture or dislocation otherwise identified. IMPRESSION: Prominent degenerative changes throughout the right hand and wrist. No acute fracture or dislocation is suggested. Deformity of the fifth finger likely represents old ligamentous injury. Electronically Signed   By: Lucienne Capers M.D.   On: 02/10/2016 21:26   Dg Hip Unilat With Pelvis 2-3 Views Left  Result Date: 02/10/2016 CLINICAL DATA:  Left hip pain after falling in the bathroom at  home today. EXAM: DG HIP (WITH OR WITHOUT PELVIS) 2-3V LEFT COMPARISON:  None. FINDINGS: Bilateral total hip prostheses. There is a small, linear avulsion fracture involving the lateral aspect of the proximal femoral shaft. Moderate levoconvex lumbar rotary scoliosis. IMPRESSION: Small, linear avulsion fracture involving the lateral aspect of the proximal femoral shaft. This could be acute or old. Electronically Signed   By: Claudie Revering M.D.   On: 02/10/2016 19:41   Dg Hip Unilat With Pelvis 2-3 Views Right  Result Date: 02/13/2016 CLINICAL DATA:  Recent fall in the bathroom. Right hip pain. Prior bilateral hip arthroplasties. EXAM: DG HIP (WITH OR WITHOUT  PELVIS) 2-3V RIGHT COMPARISON:  02/13/2015. FINDINGS: Prior right hip arthroplasty with anatomic alignment. Extensive myositis ossifications, unchanged. No evidence of acute fracture or dislocation. Symphysis pubis and right sacroiliac joints intact with mild degenerative changes. IMPRESSION: No acute osseous abnormality. Prior right hip arthroplasty with anatomic alignment. Electronically Signed   By: Evangeline Dakin M.D.   On: 02/13/2016 13:40   Dg Femur 1v Left  Result Date: 02/10/2016 CLINICAL DATA:  Possible left femur fracture by prior plain films. EXAM: SINGLE VIEW OF THE LEFT FEMUR COMPARISON:  Single view of the left femur this same day. FINDINGS: Left hip replacement is again seen. There is a small linear focus of cortical thickening along the proximal diaphysis left femur. No fractures identified. IMPRESSION: No fracture is seen. Small focus of cortical thickening along the proximal diaphysis of the left femur will appears chronic and may be due to remote stress change. Electronically Signed   By: Inge Rise M.D.   On: 02/10/2016 21:25   (Echo, Carotid, EGD, Colonoscopy, ERCP)    Subjective:   Discharge Exam: Vitals:   02/22/16 2130 02/23/16 0440  BP: 120/62 (!) 139/53  Pulse: 68 77  Resp: 18 15  Temp: 98.3 F (36.8 C) 98.2 F (36.8 C)   Vitals:   02/22/16 0510 02/22/16 1536 02/22/16 2130 02/23/16 0440  BP: (!) 158/70 (!) 107/56 120/62 (!) 139/53  Pulse: 85 81 68 77  Resp: 16 18 18 15   Temp: 98.2 F (36.8 C) 98.3 F (36.8 C) 98.3 F (36.8 C) 98.2 F (36.8 C)  TempSrc: Oral Axillary Oral Oral  SpO2: 94% 93% 100% 95%  Weight:      Height:        General: Pt is alert, awake, not in acute distress Cardiovascular: RRR, S1/S2 +, no rubs, no gallops Respiratory: CTA bilaterally, no wheezing, no rhonchi Abdominal: Soft, NT, ND, bowel sounds + Extremities: no edema, no cyanosis    The results of significant diagnostics from this hospitalization (including  imaging, microbiology, ancillary and laboratory) are listed below for reference.     Microbiology: No results found for this or any previous visit (from the past 240 hour(s)).   Labs: BNP (last 3 results)  Recent Labs  06/03/15 1904 02/20/16 1428  BNP 291.1* [id number]*   Basic Metabolic Panel:  Recent Labs Lab 02/20/16 0440 02/21/16 0442 02/22/16 0436 02/23/16 0416  NA 135 138 138 138  K 5.0 4.5 4.7 5.3*  CL 101 104 106 105  CO2 26 27 27 27   GLUCOSE 92 101* 97 97  BUN 26* 23* 24* 27*  CREATININE 1.20* 1.14* 1.15* 1.20*  CALCIUM 8.4* 8.6* 8.6* 8.8*   Liver Function Tests: No results for input(s): AST, ALT, ALKPHOS, BILITOT, PROT, ALBUMIN in the last 168 hours. No results for input(s): LIPASE, AMYLASE in the last 168 hours. No results for input(s):  AMMONIA in the last 168 hours. CBC:  Recent Labs Lab 02/20/16 0440 02/21/16 0442 02/22/16 0436 02/23/16 0416  WBC 8.7 7.1 8.1 8.7  NEUTROABS 6.1  --   --   --   HGB 7.7* 9.7* 9.5* 9.4*  HCT 24.4* 30.7* 29.8* 30.5*  MCV 89.4 88.7 88.4 90.8  PLT 400 310 336 331   Cardiac Enzymes: No results for input(s): CKTOTAL, CKMB, CKMBINDEX, TROPONINI in the last 168 hours. BNP: Invalid input(s): POCBNP CBG: No results for input(s): GLUCAP in the last 168 hours. D-Dimer No results for input(s): DDIMER in the last 72 hours. Hgb A1c  Recent Labs  02/20/16 1021  HGBA1C 5.6   Lipid Profile No results for input(s): CHOL, HDL, LDLCALC, TRIG, CHOLHDL, LDLDIRECT in the last 72 hours. Thyroid function studies  Recent Labs  02/20/16 1021  TSH 1.511   Anemia work up  Recent Labs  02/20/16 1428  VITAMINB12 1,066*  FOLATE 45.2  FERRITIN 57  TIBC 323  IRON 29  RETICCTPCT 1.3   Urinalysis    Component Value Date/Time   COLORURINE YELLOW 02/20/2016 0814   APPEARANCEUR CLOUDY (A) 02/20/2016 0814   LABSPEC 1.010 02/20/2016 0814   PHURINE 7.0 02/20/2016 0814   GLUCOSEU NEGATIVE 02/20/2016 0814   HGBUR SMALL (A)  02/20/2016 0814   BILIRUBINUR NEGATIVE 02/20/2016 0814   KETONESUR NEGATIVE 02/20/2016 0814   PROTEINUR NEGATIVE 02/20/2016 0814   UROBILINOGEN 0.2 02/13/2015 0522   NITRITE NEGATIVE 02/20/2016 0814   LEUKOCYTESUR MODERATE (A) 02/20/2016 0814   Sepsis Labs Invalid input(s): PROCALCITONIN,  WBC,  LACTICIDVEN Microbiology No results found for this or any previous visit (from the past 240 hour(s)).   Time coordinating discharge: Over 30 minutes  SIGNED:   Birdie Hopes, MD  [facility] 02/23/2016, 9:45 AM Pager   If 7PM-7AM, please contact night-coverage [email or web address] Password [email or web address]

## 2016-02-24 ENCOUNTER — Encounter: Payer: Self-pay | Admitting: *Deleted

## 2016-02-24 ENCOUNTER — Other Ambulatory Visit: Payer: Self-pay | Admitting: *Deleted

## 2016-02-24 ENCOUNTER — Other Ambulatory Visit: Payer: Self-pay

## 2016-02-24 DIAGNOSIS — M0520 Rheumatoid vasculitis with rheumatoid arthritis of unspecified site: Secondary | ICD-10-CM

## 2016-02-24 DIAGNOSIS — C801 Malignant (primary) neoplasm, unspecified: Secondary | ICD-10-CM

## 2016-02-24 MED ORDER — OXYCODONE HCL ER 15 MG PO T12A: 15.0000 mg | EXTENDED_RELEASE_TABLET | Freq: Two times a day (BID) | ORAL | 0 refills | Status: DC

## 2016-02-24 MED ORDER — OXYCODONE-ACETAMINOPHEN 5-325 MG PO TABS: 1.0000 | ORAL_TABLET | Freq: Four times a day (QID) | ORAL | 0 refills | Status: DC | PRN

## 2016-02-24 NOTE — Patient Outreach (Signed)
Noted that member was discharged from hospital to SNF [facility]) on yesterday.  Referral placed for social worker to follow.  This care manager will initiate transition of care program upon discharge from SNF.  Valente David, [place], MSN [place] [phone]

## 2016-02-24 NOTE — Addendum Note (Signed)
Addended by: Moshe Cipro, Antanette Richwine A on: 02/24/2016 03:05 PM   Modules accepted: Orders

## 2016-02-24 NOTE — Telephone Encounter (Signed)
Rx faxed to [facility] @ [phone], phone number [phone]  

## 2016-02-25 ENCOUNTER — Telehealth: Payer: Self-pay | Admitting: Internal Medicine

## 2016-02-25 DIAGNOSIS — F1120 Opioid dependence, uncomplicated: Secondary | ICD-10-CM | POA: Diagnosis not present

## 2016-02-25 DIAGNOSIS — M6281 Muscle weakness (generalized): Secondary | ICD-10-CM | POA: Diagnosis not present

## 2016-02-25 DIAGNOSIS — R2681 Unsteadiness on feet: Secondary | ICD-10-CM | POA: Diagnosis not present

## 2016-02-25 DIAGNOSIS — S32009A Unspecified fracture of unspecified lumbar vertebra, initial encounter for closed fracture: Secondary | ICD-10-CM | POA: Diagnosis not present

## 2016-02-25 DIAGNOSIS — R5381 Other malaise: Secondary | ICD-10-CM | POA: Diagnosis not present

## 2016-02-25 DIAGNOSIS — Z5189 Encounter for other specified aftercare: Secondary | ICD-10-CM | POA: Diagnosis not present

## 2016-02-25 DIAGNOSIS — M545 Low back pain: Secondary | ICD-10-CM | POA: Diagnosis not present

## 2016-02-25 DIAGNOSIS — R262 Difficulty in walking, not elsewhere classified: Secondary | ICD-10-CM | POA: Diagnosis not present

## 2016-02-25 DIAGNOSIS — G8929 Other chronic pain: Secondary | ICD-10-CM | POA: Diagnosis not present

## 2016-02-25 DIAGNOSIS — M25551 Pain in right hip: Secondary | ICD-10-CM | POA: Diagnosis not present

## 2016-02-26 ENCOUNTER — Encounter: Payer: PPO | Admitting: Podiatry

## 2016-02-26 DIAGNOSIS — R5381 Other malaise: Secondary | ICD-10-CM | POA: Diagnosis not present

## 2016-02-26 DIAGNOSIS — G8929 Other chronic pain: Secondary | ICD-10-CM | POA: Diagnosis not present

## 2016-02-26 DIAGNOSIS — M6281 Muscle weakness (generalized): Secondary | ICD-10-CM | POA: Diagnosis not present

## 2016-02-26 DIAGNOSIS — M545 Low back pain: Secondary | ICD-10-CM | POA: Diagnosis not present

## 2016-02-26 DIAGNOSIS — R2681 Unsteadiness on feet: Secondary | ICD-10-CM | POA: Diagnosis not present

## 2016-02-26 DIAGNOSIS — F1120 Opioid dependence, uncomplicated: Secondary | ICD-10-CM | POA: Diagnosis not present

## 2016-02-26 DIAGNOSIS — Z5189 Encounter for other specified aftercare: Secondary | ICD-10-CM | POA: Diagnosis not present

## 2016-02-26 DIAGNOSIS — S32009A Unspecified fracture of unspecified lumbar vertebra, initial encounter for closed fracture: Secondary | ICD-10-CM | POA: Diagnosis not present

## 2016-02-26 DIAGNOSIS — M25551 Pain in right hip: Secondary | ICD-10-CM | POA: Diagnosis not present

## 2016-02-26 DIAGNOSIS — R262 Difficulty in walking, not elsewhere classified: Secondary | ICD-10-CM | POA: Diagnosis not present

## 2016-02-26 LAB — BASIC METABOLIC PANEL
BUN: 24 mg/dL — AB (ref 4–21)
CREATININE: 1.1 mg/dL (ref 0.5–1.1)
Glucose: 74 mg/dL
POTASSIUM: 5.4 mmol/L — AB (ref 3.4–5.3)
SODIUM: 140 mmol/L (ref 137–147)

## 2016-02-27 ENCOUNTER — Other Ambulatory Visit: Payer: Self-pay | Admitting: *Deleted

## 2016-02-27 ENCOUNTER — Non-Acute Institutional Stay (SKILLED_NURSING_FACILITY): Payer: PPO | Admitting: Internal Medicine

## 2016-02-27 ENCOUNTER — Other Ambulatory Visit: Payer: Self-pay | Admitting: Internal Medicine

## 2016-02-27 ENCOUNTER — Encounter: Payer: Self-pay | Admitting: Internal Medicine

## 2016-02-27 ENCOUNTER — Encounter: Payer: Self-pay | Admitting: *Deleted

## 2016-02-27 DIAGNOSIS — R600 Localized edema: Secondary | ICD-10-CM

## 2016-02-27 DIAGNOSIS — D638 Anemia in other chronic diseases classified elsewhere: Secondary | ICD-10-CM

## 2016-02-27 DIAGNOSIS — S32009A Unspecified fracture of unspecified lumbar vertebra, initial encounter for closed fracture: Secondary | ICD-10-CM | POA: Diagnosis not present

## 2016-02-27 DIAGNOSIS — R262 Difficulty in walking, not elsewhere classified: Secondary | ICD-10-CM | POA: Diagnosis not present

## 2016-02-27 DIAGNOSIS — N183 Chronic kidney disease, stage 3 (moderate): Secondary | ICD-10-CM | POA: Diagnosis not present

## 2016-02-27 DIAGNOSIS — M545 Low back pain: Secondary | ICD-10-CM | POA: Diagnosis not present

## 2016-02-27 DIAGNOSIS — M069 Rheumatoid arthritis, unspecified: Secondary | ICD-10-CM

## 2016-02-27 DIAGNOSIS — M6281 Muscle weakness (generalized): Secondary | ICD-10-CM | POA: Diagnosis not present

## 2016-02-27 DIAGNOSIS — G8929 Other chronic pain: Secondary | ICD-10-CM | POA: Diagnosis not present

## 2016-02-27 DIAGNOSIS — S32048S Other fracture of fourth lumbar vertebra, sequela: Secondary | ICD-10-CM | POA: Diagnosis not present

## 2016-02-27 DIAGNOSIS — R531 Weakness: Secondary | ICD-10-CM

## 2016-02-27 DIAGNOSIS — F329 Major depressive disorder, single episode, unspecified: Secondary | ICD-10-CM | POA: Diagnosis not present

## 2016-02-27 DIAGNOSIS — Z5189 Encounter for other specified aftercare: Secondary | ICD-10-CM | POA: Diagnosis not present

## 2016-02-27 DIAGNOSIS — R938 Abnormal findings on diagnostic imaging of other specified body structures: Secondary | ICD-10-CM

## 2016-02-27 DIAGNOSIS — F1120 Opioid dependence, uncomplicated: Secondary | ICD-10-CM | POA: Diagnosis not present

## 2016-02-27 DIAGNOSIS — M25551 Pain in right hip: Secondary | ICD-10-CM | POA: Diagnosis not present

## 2016-02-27 DIAGNOSIS — K5900 Constipation, unspecified: Secondary | ICD-10-CM

## 2016-02-27 DIAGNOSIS — R9389 Abnormal findings on diagnostic imaging of other specified body structures: Secondary | ICD-10-CM

## 2016-02-27 DIAGNOSIS — R5381 Other malaise: Secondary | ICD-10-CM | POA: Diagnosis not present

## 2016-02-27 DIAGNOSIS — R2681 Unsteadiness on feet: Secondary | ICD-10-CM | POA: Diagnosis not present

## 2016-02-27 NOTE — Patient Outreach (Signed)
[facility]) Care Management  02/27/2016  [name] 11/08/1921 [id number]   CSW was able to make contact with patient today to perform the initial assessment, as well as assess and assist with social work needs and services.  CSW introduced self, explained role and types of services provided through [facility] ([facility]).  CSW further explained to patient that CSW works with patient's RNCM, also with [facility], Valente David. CSW then explained the reason for the visit, indicating that Mrs. Patty Walters thought that patient would benefit from social work services and resources to assist with possible discharge planning needs and services from the skilled nursing facility where patient currently resides.  CSW obtained two HIPAA compliant identifiers from patient, which included patient's name and date of birth. Patient was pleasant during visit today, but not very talkative.  Patient is unclear of her discharge plans, whether she will be returning home to live with her daughter or be placed into a long-term care facility to receive 24 hour care and supervision.  CSW agreed to attending patient's Discharge Planning Meeting to assist with discharge planning arrangements.  Patient voiced understanding and was agreeable to this plan.  CSW will meet with patient on August 14th at the skilled facility to assist with discharge planning needs. Nat Christen, BSW, MSW, LCSW  Licensed Education officer, [facility] [employer] [facility]  [address] [place] [address] [place], [place] [address] Physical Address-[address], [place], [place] [address] Toll Free Main # [phone] Fax # [phone] Cell # [phone]  [email or web address] # [phone]  [email or web address]       .

## 2016-02-27 NOTE — Progress Notes (Signed)
LOCATION: [place]  PCP: [name], NP   Code Status: DNR  Goals of care: Advanced Directive information Advanced Directives 02/27/2016  Does patient have an advance directive? Yes  Type of Advance Directive Out of facility DNR (pink MOST or yellow form)  Does patient want to make changes to advanced directive? No - Patient declined  Copy of advanced directive(s) in chart? Yes  Would patient like information on creating an advanced directive? -  Pre-existing out of facility DNR order (yellow form or pink MOST form) -       Extended Emergency Contact Information Primary Emergency Contact: Corvinus,[name] Address: [place],  [address] [place] Phone: [phone] Relation: Daughter Secondary Emergency Contact: Jearld Shines [place] Mobile Phone: [phone] Relation: Daughter   No Known Allergies  Chief Complaint  Patient presents with  . New Admit To SNF    New Admission     HPI:  Patient is a 82 y.o. female seen today for short term rehabilitation post hospital admission from 02/20/16-02/23/16 with hip pain and low back pain. She was noted to have non displaced L4 lumbar vertebrae fracture. She was placed on pain medication. Ct of the chest was done with her hypoxia to rule out pulmonary embolism, PE was ruled out but it showed 3.5 cm fluid vs pericardial cyst with concern for possible necrotic lymph node vs mets. She is seen in her room today. Her pain medication has been helping with her pain.   Review of Systems:  Constitutional: Negative for fever, diaphoresis.  HENT: Negative for headache, congestion, nasal discharge Eyes: Negative for blurred vision, double vision and discharge.  Respiratory: Negative for cough, shortness of breath and wheezing.   Cardiovascular: Negative for chest pain, palpitations. Has chronic leg swelling.  Gastrointestinal: Negative for heartburn, nausea,  vomiting, abdominal pain. Had bowel movement yesterday. Genitourinary: Negative for dysuria and flank pain.  Musculoskeletal: Negative for back pain, fall.  Skin: Negative for itching, rash.  Neurological: Negative for dizziness. Psychiatric/Behavioral: Negative for depression   Past Medical History:  Diagnosis Date  . Allergy   . Anemia   . Blood transfusion without reported diagnosis   . Cancer ([place])   . Cataract   . Melanoma ([place])   . Rheumatoid arthritis(714.0)   . UTI (urinary tract infection)    Past Surgical History:  Procedure Laterality Date  . APPENDECTOMY    . BREAST SURGERY Left 2010   ? breast cancer  . FRACTURE SURGERY    . hip replacement Bilateral   . JOINT REPLACEMENT    . MASTECTOMY Left    Social History:   reports that she has never smoked. She has never used smokeless tobacco. She reports that she drinks about 0.6 oz of alcohol per week . She reports that she does not use drugs.  Family History  Problem Relation Age of Onset  . Emphysema Father     smoked and was exposed to mustard gas  . COPD Father   . Cancer Brother   . Cancer Sister   . Cancer Sister     esophageal  . Hypertension Daughter   . Hypertension Son     Medications:   Medication List       Accurate as of 02/27/16  2:28 PM. Always use your most recent med list.          acetaminophen 500 MG tablet  Commonly known as:  TYLENOL Take 500-1,000 mg by mouth every 6 (six) hours as needed for mild pain.   barrier cream Crea Commonly known as:  non-specified Apply 1 application topically 2 (two) times daily as needed (To prevent bedsores.).   DULoxetine 60 MG capsule Commonly known as:  CYMBALTA TAKE 1 CAPSULE (60 MG TOTAL) BY MOUTH DAILY.   furosemide 20 MG tablet Commonly known as:  LASIX TAKE 1 TABLET BY MOUTH EVERY DAY AS NEEDED FOR EDEMA   hydroxychloroquine 200 MG tablet Commonly known as:  PLAQUENIL TAKE 1 TABLET (200 MG TOTAL) BY MOUTH DAILY.   IMLYGIC 1000000  UNIT/ML Susp Generic drug:  Talimogene Laherparepvec 1 each by Intralesional route every 14 (fourteen) days.   lidocaine-prilocaine cream Commonly known as:  EMLA Apply 1 application topically as needed (Apply to lesions undergoing injection 30 mins prior to treatment.).   magnesium hydroxide 800 MG/5ML suspension Commonly known as:  MILK OF MAGNESIA Take 15 mLs by mouth daily.   multivitamin tablet Take 1 tablet by mouth daily.   neomycin-bacitracin-polymyxin [phone] ointment Apply topically 3 (three) times daily. Right shin abrasion   nystatin powder Generic drug:  nystatin Twice daily as needed to affected area   OVER THE COUNTER MEDICATION Place 2 drops into both eyes as needed (For eye irritation.). [facility] Allergy Relief Eye Drops   oxyCODONE 10 mg 12 hr tablet Commonly known as:  OXYCONTIN Take 10 mg by mouth every 12 (twelve) hours.   oxyCODONE-acetaminophen 5-325 MG tablet Commonly known as:  PERCOCET Take 1-2 tablets by mouth every 6 (six) hours as needed for severe pain.   polyethylene glycol packet Commonly known as:  MIRALAX / GLYCOLAX Take 17 g by mouth daily.   prochlorperazine 10 MG tablet Commonly known as:  COMPAZINE Take 10 mg by mouth every 6 (six) hours as needed for nausea or vomiting.   senna 8.6 MG tablet Commonly known as:  SENOKOT Take 1 tablet by mouth daily.       Immunizations: Immunization History  Administered Date(s) Administered  . Influenza Split 05/03/2012, 05/03/2013  . Influenza,inj,Quad PF,36+ Mos 05/15/2014, 04/11/2015  . Pneumococcal Conjugate-13 07/25/2014  . Pneumococcal Polysaccharide-23 09/04/2003  . Tdap 02/10/2016     Physical Exam:  Vitals:   02/27/16 1419  BP: 122/63  Pulse: 68  Resp: 18  Temp: 97.8 F (36.6 C)  TempSrc: Oral  SpO2: 96%  Weight: 140 lb (63.5 kg)  Height: 5\' 4"  (1.626 m)   Body mass index is 24.03 kg/m.  General- elderly female, well built, in no acute distress Head-  normocephalic, atraumatic Nose- no nasal discharge Throat- moist mucus membrane Eyes- PERRLA, EOMI, no pallor, no icterus, no discharge Neck- no cervical lymphadenopathy Cardiovascular- normal s1,s2, no murmur, trace leg edema Respiratory- bilateral clear to auscultation, no wheeze, no rhonchi, no crackles, no use of accessory muscles Abdomen- bowel sounds present, soft, non tender Musculoskeletal- able to move all 4 extremities, generalized weakness, limited ROM with her back with pain Neurological- alert and oriented to person, place and time Skin- warm and dry, right leg erythema noted Psychiatry- normal mood and affect    Labs reviewed: Basic Metabolic Panel:  Recent Labs  02/21/16 0442 02/22/16 0436 02/23/16 0416  NA 138 138 138  K 4.5 4.7 5.3*  CL 104 106 105  CO2 27 27 27   GLUCOSE 101* 97 97  BUN 23* 24* 27*  CREATININE 1.14* 1.15* 1.20*  CALCIUM 8.6* 8.6* 8.8*   Liver Function Tests:  Recent Labs  06/03/15 1904 12/16/15 1452  AST 30 16  ALT 23 15  ALKPHOS 65 70  BILITOT 0.4 0.2  PROT 7.5 6.6  ALBUMIN 3.8 3.9   No results for input(s): LIPASE, AMYLASE in the last 8760 hours. No results for input(s): AMMONIA in the last 8760 hours. CBC:  Recent Labs  12/16/15 1452 02/10/16 1947 02/20/16 0440 02/21/16 0442 02/22/16 0436 02/23/16 0416  WBC 7.0 10.7* 8.7 7.1 8.1 8.7  NEUTROABS 5.1 9.0* 6.1  --   --   --   HGB  --  11.5* 7.7* 9.7* 9.5* 9.4*  HCT 30.8* 34.8* 24.4* 30.7* 29.8* 30.5*  MCV 84 85.9 89.4 88.7 88.4 90.8  PLT 299 285 400 310 336 331   Cardiac Enzymes: No results for input(s): CKTOTAL, CKMB, CKMBINDEX, TROPONINI in the last 8760 hours. BNP: Invalid input(s): POCBNP CBG: No results for input(s): GLUCAP in the last 8760 hours.  Radiological Exams: Dg Chest 2 View  Result Date: 02/10/2016 CLINICAL DATA:  Status post fall today.  Left hip pain. EXAM: CHEST  2 VIEW COMPARISON:  CT chest and PA and lateral chest 06/03/2015. FINDINGS: The  lungs are clear. Heart size is upper normal. No pneumothorax or pleural effusion. Marked degenerative change about the shoulders is noted. IMPRESSION: No acute disease. Electronically Signed   By: Inge Rise M.D.   On: 02/10/2016 19:39   Ct Head Wo Contrast  Result Date: 02/10/2016 CLINICAL DATA:  Status post fall.  Hit head. EXAM: CT HEAD WITHOUT CONTRAST CT CERVICAL SPINE WITHOUT CONTRAST TECHNIQUE: Multidetector CT imaging of the head and cervical spine was performed following the standard protocol without intravenous contrast. Multiplanar CT image reconstructions of the cervical spine were also generated. COMPARISON:  03/05/2015 FINDINGS: CT HEAD FINDINGS There is no evidence of mass effect, midline shift, or extra-axial fluid collections. There is no evidence of a space-occupying lesion or intracranial hemorrhage. There is no evidence of a cortical-based area of acute infarction. There is generalized cerebral atrophy. There is periventricular white matter low attenuation likely secondary to microangiopathy. The ventricles and sulci are appropriate for the patient's age. The basal cisterns are patent. Visualized portions of the orbits are unremarkable. The visualized portions of the paranasal sinuses and mastoid air cells are unremarkable. Cerebrovascular atherosclerotic calcifications are noted. The osseous structures are unremarkable. CT CERVICAL SPINE FINDINGS The alignment is anatomic. The vertebral body heights are maintained. There is no acute fracture. The prevertebral soft tissues are normal. The intraspinal soft tissues are not fully imaged on this examination due to poor soft tissue contrast, but there is no gross soft tissue abnormality. 4 mm anterolisthesis of C2 on C3 secondary to severe bilateral facet arthropathy. Anterior ankylosis at C3-4, C4-5 and C5-6. Ankylosis of the posterior elements of C4-5. Ankylosis of the right posterior elements at C3-4 and C4-5. Broad-based disc osteophyte  complex at C4-5 and C5-6. Degenerative disc disease with disc height loss at C6-7. 5 mm of anterolisthesis of C6 on C7 secondary to severe bilateral facet arthropathy. Degenerative disc disease with disc height loss at T1-2 and T2-3 with bilateral facet arthropathy. There is biapical fibrosis. IMPRESSION: 1. No acute intracranial pathology. 2. No acute osseous injury of the cervical spine. 3. Cervical spine spondylosis as described above. Electronically Signed   By: Kathreen Devoid   On: 02/10/2016 20:16   Ct Angio Chest Pe W Or Wo Contrast  Result Date: 02/20/2016 CLINICAL DATA:  Hypoxia and abnormal chest x-ray. EXAM: CT ANGIOGRAPHY CHEST WITH  CONTRAST TECHNIQUE: Multidetector CT imaging of the chest was performed using the standard protocol during bolus administration of intravenous contrast. Multiplanar CT image reconstructions and MIPs were obtained to evaluate the vascular anatomy. CONTRAST:  80 cc of Isovue 370 intravenously. COMPARISON:  Chest radiograph 02/20/2016 FINDINGS: Mediastinum/Lymph Nodes: No pulmonary emboli or thoracic aortic dissection identified. No masses or pathologically enlarged lymph nodes identified. There is a 3.5 by 2.1 cm fluid collection within the aortopulmonary window recess, an extension of the superior aortic recess. This finding likely accounts for the abnormality seen radiographically. Lungs/Pleura: Biapical scarring is noted. There is focal perifissural thickening in the right lower lobe, which may also represent scarring. Mild bibasilar interstitial thickening is seen. Upper abdomen: No acute findings. Musculoskeletal: No chest wall mass or suspicious bone lesions identified. Review of the MIP images confirms the above findings. IMPRESSION: 3.5 cm fluid collection versus pericardial cyst within the aortopulmonary window recess likely accounts for the radiographically seen abnormality. Alternatively but less likely this may represent necrotic lymphadenopathy. No focal airspace  consolidation. Mild cardiomegaly. Electronically Signed   By: Fidela Salisbury M.D.   On: 02/20/2016 19:53   Ct Cervical Spine Wo Contrast  Result Date: 02/10/2016 CLINICAL DATA:  Status post fall.  Hit head. EXAM: CT HEAD WITHOUT CONTRAST CT CERVICAL SPINE WITHOUT CONTRAST TECHNIQUE: Multidetector CT imaging of the head and cervical spine was performed following the standard protocol without intravenous contrast. Multiplanar CT image reconstructions of the cervical spine were also generated. COMPARISON:  03/05/2015 FINDINGS: CT HEAD FINDINGS There is no evidence of mass effect, midline shift, or extra-axial fluid collections. There is no evidence of a space-occupying lesion or intracranial hemorrhage. There is no evidence of a cortical-based area of acute infarction. There is generalized cerebral atrophy. There is periventricular white matter low attenuation likely secondary to microangiopathy. The ventricles and sulci are appropriate for the patient's age. The basal cisterns are patent. Visualized portions of the orbits are unremarkable. The visualized portions of the paranasal sinuses and mastoid air cells are unremarkable. Cerebrovascular atherosclerotic calcifications are noted. The osseous structures are unremarkable. CT CERVICAL SPINE FINDINGS The alignment is anatomic. The vertebral body heights are maintained. There is no acute fracture. The prevertebral soft tissues are normal. The intraspinal soft tissues are not fully imaged on this examination due to poor soft tissue contrast, but there is no gross soft tissue abnormality. 4 mm anterolisthesis of C2 on C3 secondary to severe bilateral facet arthropathy. Anterior ankylosis at C3-4, C4-5 and C5-6. Ankylosis of the posterior elements of C4-5. Ankylosis of the right posterior elements at C3-4 and C4-5. Broad-based disc osteophyte complex at C4-5 and C5-6. Degenerative disc disease with disc height loss at C6-7. 5 mm of anterolisthesis of C6 on C7  secondary to severe bilateral facet arthropathy. Degenerative disc disease with disc height loss at T1-2 and T2-3 with bilateral facet arthropathy. There is biapical fibrosis. IMPRESSION: 1. No acute intracranial pathology. 2. No acute osseous injury of the cervical spine. 3. Cervical spine spondylosis as described above. Electronically Signed   By: Kathreen Devoid   On: 02/10/2016 20:16   Ct Pelvis Wo Contrast  Result Date: 02/20/2016 CLINICAL DATA:  Fall 1 week ago. Severe right hip and groin pain. Initial encounter. EXAM: CT PELVIS WITHOUT CONTRAST.  CT RIGHT HIP WITHOUT CONTRAST TECHNIQUE: Multidetector CT imaging of the pelvis and right hip was performed following the standard protocol without intravenous contrast. COMPARISON:  Left hip CT 02/10/2016 FINDINGS: Negative for pelvic ring fracture or diastasis.  Ventral cortical irregularity at S3 is chronic. Bilateral total hip arthroplasty with cerclage wire on the right around the intertrochanteric femur. Both hips are located. No acute periprosthetic fracture. A lucency in the right greater trochanter is corticated and chronic. Horizontal fracture across the L4 vertebral body without displacement. There is also an acute L4 right pars fracture, nondisplaced. Lumbar levoscoliosis chronic pelvic tilting. No acute or posttraumatic finding in the lower abdomen and pelvis. No visible acute mild tendinous disruption. IMPRESSION: 1. Nondisplaced L4 body and right pars fractures. 2. Negative for pelvic or proximal femur fracture. 3. Bilateral total hip arthroplasty. Electronically Signed   By: Monte Fantasia M.D.   On: 02/20/2016 05:49   Ct Hip Left Wo Contrast  Result Date: 02/10/2016 CLINICAL DATA:  Status post fall, left hip pain EXAM: CT OF THE LEFT HIP WITHOUT CONTRAST TECHNIQUE: Multidetector CT imaging of the left hip was performed according to the standard protocol. Multiplanar CT image reconstructions were also generated. COMPARISON:  None. FINDINGS:  Bones/Joint/Cartilage Left total hip arthroplasty. Beam hardening artifact resulting from the arthroplasty partially obscuring the adjacent soft tissue and osseous structures. No acute fracture or dislocation. No periarticular fluid collection. No osteolysis. Normal alignment. No aggressive lytic or sclerotic osseous lesion. Degenerative changes of the left sacroiliac joint. Muscles Normal. Soft tissue No fluid collection or hematoma. No soft tissue mass. Peripheral vascular atherosclerotic disease. IMPRESSION: 1. Left total hip arthroplasty.  No acute fracture or dislocation. Electronically Signed   By: Kathreen Devoid   On: 02/10/2016 23:57   Ct Hip Right Wo Contrast  Result Date: 02/20/2016 CLINICAL DATA:  Fall 1 week ago. Severe right hip and groin pain. Initial encounter. EXAM: CT PELVIS WITHOUT CONTRAST.  CT RIGHT HIP WITHOUT CONTRAST TECHNIQUE: Multidetector CT imaging of the pelvis and right hip was performed following the standard protocol without intravenous contrast. COMPARISON:  Left hip CT 02/10/2016 FINDINGS: Negative for pelvic ring fracture or diastasis. Ventral cortical irregularity at S3 is chronic. Bilateral total hip arthroplasty with cerclage wire on the right around the intertrochanteric femur. Both hips are located. No acute periprosthetic fracture. A lucency in the right greater trochanter is corticated and chronic. Horizontal fracture across the L4 vertebral body without displacement. There is also an acute L4 right pars fracture, nondisplaced. Lumbar levoscoliosis chronic pelvic tilting. No acute or posttraumatic finding in the lower abdomen and pelvis. No visible acute mild tendinous disruption. IMPRESSION: 1. Nondisplaced L4 body and right pars fractures. 2. Negative for pelvic or proximal femur fracture. 3. Bilateral total hip arthroplasty. Electronically Signed   By: Monte Fantasia M.D.   On: 02/20/2016 05:49   Dg Chest Port 1 View  Result Date: 02/20/2016 CLINICAL DATA:  Hypoxia  today, hx non-smoker, left breast cancer EXAM: PORTABLE CHEST 1 VIEW COMPARISON:  02/10/2016 FINDINGS: Heart is mildly enlarged but also accentuated by the technique. There is elevation of right hemidiaphragm which is stable. Perihilar peribronchial thickening is noted. There are no focal consolidations. No pleural effusions or pulmonary edema. There is convex deformity in the region the AP window common warranting further evaluation. A cystic lesion has been demonstrated in this region on previous CT exam however followup is warranted given the history of left breast cancer and melanoma. Note is made of chronic changes in both shoulders indicating probable chronic rotator cuff injuries. IMPRESSION: 1. Question of enlarged AP window region mass. CT of the chest is recommended. Contrast administration is recommended unless it is contraindicated. 2. Bronchitic changes without focal pulmonary consolidation.  Electronically Signed   By: Nolon Nations M.D.   On: 02/20/2016 15:42   Dg Hand Complete Right  Result Date: 02/10/2016 CLINICAL DATA:  Right hand pain after fall at home today. Bruising in the fourth finger. EXAM: RIGHT HAND - COMPLETE 3+ VIEW COMPARISON:  None. FINDINGS: Diffuse bone demineralization. Degenerative changes throughout the interphalangeal joints, metacarpal phalangeal joints, carpometacarpal, carpal, and radiocarpal joints. Deformity of the distal interphalangeal joint of the fifth finger may indicate ligamentous injury or laxity. No acute fracture or dislocation otherwise identified. IMPRESSION: Prominent degenerative changes throughout the right hand and wrist. No acute fracture or dislocation is suggested. Deformity of the fifth finger likely represents old ligamentous injury. Electronically Signed   By: Lucienne Capers M.D.   On: 02/10/2016 21:26   Dg Hip Unilat With Pelvis 2-3 Views Left  Result Date: 02/10/2016 CLINICAL DATA:  Left hip pain after falling in the bathroom at home  today. EXAM: DG HIP (WITH OR WITHOUT PELVIS) 2-3V LEFT COMPARISON:  None. FINDINGS: Bilateral total hip prostheses. There is a small, linear avulsion fracture involving the lateral aspect of the proximal femoral shaft. Moderate levoconvex lumbar rotary scoliosis. IMPRESSION: Small, linear avulsion fracture involving the lateral aspect of the proximal femoral shaft. This could be acute or old. Electronically Signed   By: Claudie Revering M.D.   On: 02/10/2016 19:41   Dg Hip Unilat With Pelvis 2-3 Views Right  Result Date: 02/13/2016 CLINICAL DATA:  Recent fall in the bathroom. Right hip pain. Prior bilateral hip arthroplasties. EXAM: DG HIP (WITH OR WITHOUT PELVIS) 2-3V RIGHT COMPARISON:  02/13/2015. FINDINGS: Prior right hip arthroplasty with anatomic alignment. Extensive myositis ossifications, unchanged. No evidence of acute fracture or dislocation. Symphysis pubis and right sacroiliac joints intact with mild degenerative changes. IMPRESSION: No acute osseous abnormality. Prior right hip arthroplasty with anatomic alignment. Electronically Signed   By: Evangeline Dakin M.D.   On: 02/13/2016 13:40   Dg Femur 1v Left  Result Date: 02/10/2016 CLINICAL DATA:  Possible left femur fracture by prior plain films. EXAM: SINGLE VIEW OF THE LEFT FEMUR COMPARISON:  Single view of the left femur this same day. FINDINGS: Left hip replacement is again seen. There is a small linear focus of cortical thickening along the proximal diaphysis left femur. No fractures identified. IMPRESSION: No fracture is seen. Small focus of cortical thickening along the proximal diaphysis of the left femur will appears chronic and may be due to remote stress change. Electronically Signed   By: Inge Rise M.D.   On: 02/10/2016 21:25    Assessment/Plan  Generalized weakness Will have her work with physical therapy and occupational therapy team to help with gait training and muscle strengthening exercises.fall precautions. Skin care.  Encourage to be out of bed.   L4 fracture Non displaced, on pain management. Continue oxycontin 10 mg bid with tyelnol 500 mg 1-2 tab q6h prn pain with percocet 5-325 mg 1-2 tab q6h prn pain.  Constipation Continue miralax, senokot and milk of magnesia, hydration to be maintained  Chronic leg edema Continue lasix 20 mg daily as needed and to keep legs elevated at rest  RA Continue her plaquenil  Chronic depression Stable mood, continue cymbalta  Anemia of chronic disease Monitor cbc  ckd stage 2-3 Monitor bmp  Abnormal ct chest finding With cystic fluid concerning for necrotic LN vs metastases, will need repeat ct chest in 3 month and outpatient follow up. Breathing currently stable   Goals of care: short term rehabilitation  Labs/tests ordered: cbc, cmp   Family/ staff Communication: reviewed care plan with patient and nursing supervisor    Blanchie Serve, MD Internal Medicine [place], [place] [address] Cell Phone (Monday-Friday 8 am - 5 pm): [phone] On Call: [phone] and follow prompts after 5 pm and on weekends Office Phone: [phone] Office Fax: [phone]

## 2016-02-28 NOTE — Progress Notes (Signed)
This encounter was created in error - please disregard.

## 2016-02-29 ENCOUNTER — Encounter: Payer: Self-pay | Admitting: *Deleted

## 2016-03-05 ENCOUNTER — Encounter: Payer: Self-pay | Admitting: Adult Health

## 2016-03-05 ENCOUNTER — Telehealth: Payer: Self-pay

## 2016-03-05 ENCOUNTER — Non-Acute Institutional Stay (SKILLED_NURSING_FACILITY): Payer: PPO | Admitting: Adult Health

## 2016-03-05 DIAGNOSIS — F329 Major depressive disorder, single episode, unspecified: Secondary | ICD-10-CM | POA: Diagnosis not present

## 2016-03-05 DIAGNOSIS — R938 Abnormal findings on diagnostic imaging of other specified body structures: Secondary | ICD-10-CM

## 2016-03-05 DIAGNOSIS — N183 Chronic kidney disease, stage 3 (moderate): Secondary | ICD-10-CM | POA: Diagnosis not present

## 2016-03-05 DIAGNOSIS — D638 Anemia in other chronic diseases classified elsewhere: Secondary | ICD-10-CM | POA: Diagnosis not present

## 2016-03-05 DIAGNOSIS — S32048S Other fracture of fourth lumbar vertebra, sequela: Secondary | ICD-10-CM

## 2016-03-05 DIAGNOSIS — R9389 Abnormal findings on diagnostic imaging of other specified body structures: Secondary | ICD-10-CM

## 2016-03-05 DIAGNOSIS — K5900 Constipation, unspecified: Secondary | ICD-10-CM | POA: Diagnosis not present

## 2016-03-05 DIAGNOSIS — R531 Weakness: Secondary | ICD-10-CM | POA: Diagnosis not present

## 2016-03-05 DIAGNOSIS — R600 Localized edema: Secondary | ICD-10-CM

## 2016-03-05 DIAGNOSIS — M069 Rheumatoid arthritis, unspecified: Secondary | ICD-10-CM | POA: Diagnosis not present

## 2016-03-05 NOTE — Progress Notes (Signed)
Patient ID: Patty Walters, female   DOB: 1922/03/31, 82 y.o.   MRN: [id number]    DATE:  03/05/2016   MRN:  [id number]  BIRTHDAY: 01-Nov-1921  Facility:  [facility] Home Location:  [place] Room Number: [id number]  LEVEL OF CARE:  SNF (31)  Contact Information    Name Relation Home Work Mobile   [place] Daughter [phone]     Henreitta Leber Daughter   [phone]       Code Status History    Date Active Date Inactive Code Status Order ID Comments User Context   02/20/2016  9:49 AM 02/23/2016  5:28 PM DNR [id number]  Verlee Monte, MD Inpatient   02/21/2015  2:43 PM 02/20/2016  9:49 AM DNR [id number]  [name], NP Outpatient    Questions for Most Recent Historical Code Status (Order [id number])    Question Answer Comment   In the event of cardiac or respiratory ARREST Do not call a "code blue"    In the event of cardiac or respiratory ARREST Do not perform Intubation, CPR, defibrillation or ACLS    In the event of cardiac or respiratory ARREST Use medication by any route, position, wound care, and other measures to relive pain and suffering. May use oxygen, suction and manual treatment of airway obstruction as needed for comfort.         Advance Directive Documentation   Flowsheet Row Most Recent Value  Type of Advance Directive  Out of facility DNR (pink MOST or yellow form)  Pre-existing out of facility DNR order (yellow form or pink MOST form)  No data  "MOST" Form in Place?  No data       Chief Complaint  Patient presents with  . Discharge Note    HISTORY OF PRESENT ILLNESS:  This is a 82 year old female who is for discharge home with Home health OT, PT, CNA and Nursing. Resident will discharge with medications and prescriptions.  She has been admitted to [facility] on 02/23/16 from [facility] with hip pain and low back pain. She was diagnosed to have nondisplaced L4 lumbar vertebrae fracture. She was managed with pain  medication . CT of chest done and showed 3.5 cm fluid vs pericardial cyst with concern for possible necrotic lymph node vs mets.  Patient was admitted to this facility for short-term rehabilitation after the patient's recent hospitalization.  Patient has completed SNF rehabilitation and therapy has cleared the patient for discharge.   PAST MEDICAL HISTORY:  Past Medical History:  Diagnosis Date  . Allergy   . Anemia   . Blood transfusion without reported diagnosis   . Cancer ([place])   . Cataract   . Melanoma ([place])   . Rheumatoid arthritis(714.0)   . UTI (urinary tract infection)      CURRENT MEDICATIONS: Reviewed  Patient's Medications  New Prescriptions   No medications on file  Previous Medications   ACETAMINOPHEN (TYLENOL) 500 MG TABLET    Take 500-1,000 mg by mouth every 6 (six) hours as needed for mild pain.   BARRIER CREAM (NON-SPECIFIED) CREA    Apply 1 application topically 2 (two) times daily as needed (To prevent bedsores.).    DULOXETINE (CYMBALTA) 60 MG CAPSULE    TAKE 1 CAPSULE (60 MG TOTAL) BY MOUTH DAILY.   FUROSEMIDE (LASIX) 20 MG TABLET    TAKE 1 TABLET BY MOUTH EVERY DAY AS NEEDED FOR EDEMA   HYDROXYCHLOROQUINE (PLAQUENIL) 200 MG TABLET  TAKE 1 TABLET (200 MG TOTAL) BY MOUTH DAILY.   LIDOCAINE-PRILOCAINE (EMLA) CREAM    Apply 1 application topically as needed (Apply to lesions undergoing injection 30 mins prior to treatment.).    MAGNESIUM HYDROXIDE (MILK OF MAGNESIA) 800 MG/5ML SUSPENSION    Take 15 mLs by mouth daily.    MULTIPLE VITAMIN (MULTIVITAMIN) TABLET    Take 1 tablet by mouth daily.   NEOMYCIN-BACITRACIN-POLYMYXIN (NEOSPORIN) [phone] OINTMENT    Apply topically 3 (three) times daily. Right shin abrasion   NYSTATIN (MYCOSTATIN/NYSTOP) 100000 UNIT/GM POWD    Twice daily as needed to affected area   OVER THE COUNTER MEDICATION    Place 2 drops into both eyes as needed (For eye irritation.). [employer] [facility] Allergy Relief Eye Drops    OXYCODONE (OXYCONTIN) 10 MG  12 HR TABLET    Take 10 mg by mouth every 12 (twelve) hours.   OXYCODONE-ACETAMINOPHEN (PERCOCET) 5-325 MG TABLET    Take 1-2 tablets by mouth every 6 (six) hours as needed for severe pain.   POLYETHYLENE GLYCOL (MIRALAX / GLYCOLAX) PACKET    Take 17 g by mouth daily.   PROCHLORPERAZINE (COMPAZINE) 10 MG TABLET    Take 10 mg by mouth every 6 (six) hours as needed for nausea or vomiting.   SENNA (SENOKOT) 8.6 MG TABLET    Take 1 tablet by mouth daily.   TALIMOGENE LAHERPAREPVEC (IMLYGIC) 1000000 UNIT/ML SUSP    1 each by Intralesional route every 14 (fourteen) days.   Modified Medications   No medications on file  Discontinued Medications   No medications on file     No Known Allergies   REVIEW OF SYSTEMS:  GENERAL: no change in appetite, no fatigue, no weight changes, no fever, chills or weakness EYES: Denies change in vision, dry eyes, eye pain, itching or discharge EARS: Denies change in hearing, ringing in ears, or earache NOSE: Denies nasal congestion or epistaxis MOUTH and THROAT: Denies oral discomfort, gingival pain or bleeding, pain from teeth or hoarseness   RESPIRATORY: no cough, SOB, DOE, wheezing, hemoptysis CARDIAC: no chest pain, edema or palpitations GI: no abdominal pain, diarrhea, constipation, heart burn, nausea or vomiting GU: Denies dysuria, frequency, hematuria, incontinence, or discharge PSYCHIATRIC: Denies feeling of depression or anxiety. No report of hallucinations, insomnia, paranoia, or agitation    PHYSICAL EXAMINATION  GENERAL APPEARANCE: Well nourished. In no acute distress. Normal body habitus SKIN:  Right shin wound covered with dry dressing, no erythema HEAD: Normal in size and contour. No evidence of trauma EYES: Lids open and close normally. No blepharitis, entropion or ectropion. PERRL. Conjunctivae are clear and sclerae are white. Lenses are without opacity EARS: Pinnae are normal. Patient hears normal voice tunes of the examiner MOUTH and  THROAT: Lips are without lesions. Oral mucosa is moist and without lesions. Tongue is normal in shape, size, and color and without lesions NECK: supple, trachea midline, no neck masses, no thyroid tenderness, no thyromegaly LYMPHATICS: no LAN in the neck, no supraclavicular LAN RESPIRATORY: breathing is even & unlabored, BS CTAB CARDIAC: RRR, no murmur,no extra heart sounds, no edema GI: abdomen soft, normal BS, no masses, no tenderness, no hepatomegaly, no splenomegaly EXTREMITIES:  Able to move X 4 extremities PSYCHIATRIC: Alert and oriented X 3. Affect and behavior are appropriate  LABS/RADIOLOGY: Labs reviewed: 03/02/16   sodium 143 K4.9 glucose 78 BUN 20 creatinine 1.08 calcium 8.8 GFR [id number] Basic Metabolic Panel:  Recent Labs  02/21/16 0442 02/22/16 0436 02/23/16 0416 02/26/16  NA  138 138 138 140  K 4.5 4.7 5.3* 5.4*  CL 104 106 105  --   CO2 27 27 27   --   GLUCOSE 101* 97 97  --   BUN 23* 24* 27* 24*  CREATININE 1.14* 1.15* 1.20* 1.1  CALCIUM 8.6* 8.6* 8.8*  --    Liver Function Tests:  Recent Labs  06/03/15 1904 12/16/15 1452  AST 30 16  ALT 23 15  ALKPHOS 65 70  BILITOT 0.4 0.2  PROT 7.5 6.6  ALBUMIN 3.8 3.9   CBC:  Recent Labs  12/16/15 1452 02/10/16 1947 02/20/16 0440 02/21/16 0442 02/22/16 0436 02/23/16 0416  WBC 7.0 10.7* 8.7 7.1 8.1 8.7  NEUTROABS 5.1 9.0* 6.1  --   --   --   HGB  --  11.5* 7.7* 9.7* 9.5* 9.4*  HCT 30.8* 34.8* 24.4* 30.7* 29.8* 30.5*  MCV 84 85.9 89.4 88.7 88.4 90.8  PLT 299 285 400 310 336 331     Dg Chest 2 View  Result Date: 02/10/2016 CLINICAL DATA:  Status post fall today.  Left hip pain. EXAM: CHEST  2 VIEW COMPARISON:  CT chest and PA and lateral chest 06/03/2015. FINDINGS: The lungs are clear. Heart size is upper normal. No pneumothorax or pleural effusion. Marked degenerative change about the shoulders is noted. IMPRESSION: No acute disease. Electronically Signed   By: Inge Rise M.D.   On: 02/10/2016 19:39    Ct Head Wo Contrast  Result Date: 02/10/2016 CLINICAL DATA:  Status post fall.  Hit head. EXAM: CT HEAD WITHOUT CONTRAST CT CERVICAL SPINE WITHOUT CONTRAST TECHNIQUE: Multidetector CT imaging of the head and cervical spine was performed following the standard protocol without intravenous contrast. Multiplanar CT image reconstructions of the cervical spine were also generated. COMPARISON:  03/05/2015 FINDINGS: CT HEAD FINDINGS There is no evidence of mass effect, midline shift, or extra-axial fluid collections. There is no evidence of a space-occupying lesion or intracranial hemorrhage. There is no evidence of a cortical-based area of acute infarction. There is generalized cerebral atrophy. There is periventricular white matter low attenuation likely secondary to microangiopathy. The ventricles and sulci are appropriate for the patient's age. The basal cisterns are patent. Visualized portions of the orbits are unremarkable. The visualized portions of the paranasal sinuses and mastoid air cells are unremarkable. Cerebrovascular atherosclerotic calcifications are noted. The osseous structures are unremarkable. CT CERVICAL SPINE FINDINGS The alignment is anatomic. The vertebral body heights are maintained. There is no acute fracture. The prevertebral soft tissues are normal. The intraspinal soft tissues are not fully imaged on this examination due to poor soft tissue contrast, but there is no gross soft tissue abnormality. 4 mm anterolisthesis of C2 on C3 secondary to severe bilateral facet arthropathy. Anterior ankylosis at C3-4, C4-5 and C5-6. Ankylosis of the posterior elements of C4-5. Ankylosis of the right posterior elements at C3-4 and C4-5. Broad-based disc osteophyte complex at C4-5 and C5-6. Degenerative disc disease with disc height loss at C6-7. 5 mm of anterolisthesis of C6 on C7 secondary to severe bilateral facet arthropathy. Degenerative disc disease with disc height loss at T1-2 and T2-3 with  bilateral facet arthropathy. There is biapical fibrosis. IMPRESSION: 1. No acute intracranial pathology. 2. No acute osseous injury of the cervical spine. 3. Cervical spine spondylosis as described above. Electronically Signed   By: Kathreen Devoid   On: 02/10/2016 20:16   Ct Angio Chest Pe W Or Wo Contrast  Result Date: 02/20/2016 CLINICAL DATA:  Hypoxia and  abnormal chest x-ray. EXAM: CT ANGIOGRAPHY CHEST WITH CONTRAST TECHNIQUE: Multidetector CT imaging of the chest was performed using the standard protocol during bolus administration of intravenous contrast. Multiplanar CT image reconstructions and MIPs were obtained to evaluate the vascular anatomy. CONTRAST:  80 cc of Isovue 370 intravenously. COMPARISON:  Chest radiograph 02/20/2016 FINDINGS: Mediastinum/Lymph Nodes: No pulmonary emboli or thoracic aortic dissection identified. No masses or pathologically enlarged lymph nodes identified. There is a 3.5 by 2.1 cm fluid collection within the aortopulmonary window recess, an extension of the superior aortic recess. This finding likely accounts for the abnormality seen radiographically. Lungs/Pleura: Biapical scarring is noted. There is focal perifissural thickening in the right lower lobe, which may also represent scarring. Mild bibasilar interstitial thickening is seen. Upper abdomen: No acute findings. Musculoskeletal: No chest wall mass or suspicious bone lesions identified. Review of the MIP images confirms the above findings. IMPRESSION: 3.5 cm fluid collection versus pericardial cyst within the aortopulmonary window recess likely accounts for the radiographically seen abnormality. Alternatively but less likely this may represent necrotic lymphadenopathy. No focal airspace consolidation. Mild cardiomegaly. Electronically Signed   By: Fidela Salisbury M.D.   On: 02/20/2016 19:53   Ct Cervical Spine Wo Contrast  Result Date: 02/10/2016 CLINICAL DATA:  Status post fall.  Hit head. EXAM: CT HEAD WITHOUT  CONTRAST CT CERVICAL SPINE WITHOUT CONTRAST TECHNIQUE: Multidetector CT imaging of the head and cervical spine was performed following the standard protocol without intravenous contrast. Multiplanar CT image reconstructions of the cervical spine were also generated. COMPARISON:  03/05/2015 FINDINGS: CT HEAD FINDINGS There is no evidence of mass effect, midline shift, or extra-axial fluid collections. There is no evidence of a space-occupying lesion or intracranial hemorrhage. There is no evidence of a cortical-based area of acute infarction. There is generalized cerebral atrophy. There is periventricular white matter low attenuation likely secondary to microangiopathy. The ventricles and sulci are appropriate for the patient's age. The basal cisterns are patent. Visualized portions of the orbits are unremarkable. The visualized portions of the paranasal sinuses and mastoid air cells are unremarkable. Cerebrovascular atherosclerotic calcifications are noted. The osseous structures are unremarkable. CT CERVICAL SPINE FINDINGS The alignment is anatomic. The vertebral body heights are maintained. There is no acute fracture. The prevertebral soft tissues are normal. The intraspinal soft tissues are not fully imaged on this examination due to poor soft tissue contrast, but there is no gross soft tissue abnormality. 4 mm anterolisthesis of C2 on C3 secondary to severe bilateral facet arthropathy. Anterior ankylosis at C3-4, C4-5 and C5-6. Ankylosis of the posterior elements of C4-5. Ankylosis of the right posterior elements at C3-4 and C4-5. Broad-based disc osteophyte complex at C4-5 and C5-6. Degenerative disc disease with disc height loss at C6-7. 5 mm of anterolisthesis of C6 on C7 secondary to severe bilateral facet arthropathy. Degenerative disc disease with disc height loss at T1-2 and T2-3 with bilateral facet arthropathy. There is biapical fibrosis. IMPRESSION: 1. No acute intracranial pathology. 2. No acute  osseous injury of the cervical spine. 3. Cervical spine spondylosis as described above. Electronically Signed   By: Kathreen Devoid   On: 02/10/2016 20:16   Ct Pelvis Wo Contrast  Result Date: 02/20/2016 CLINICAL DATA:  Fall 1 week ago. Severe right hip and groin pain. Initial encounter. EXAM: CT PELVIS WITHOUT CONTRAST.  CT RIGHT HIP WITHOUT CONTRAST TECHNIQUE: Multidetector CT imaging of the pelvis and right hip was performed following the standard protocol without intravenous contrast. COMPARISON:  Left hip CT 02/10/2016  FINDINGS: Negative for pelvic ring fracture or diastasis. Ventral cortical irregularity at S3 is chronic. Bilateral total hip arthroplasty with cerclage wire on the right around the intertrochanteric femur. Both hips are located. No acute periprosthetic fracture. A lucency in the right greater trochanter is corticated and chronic. Horizontal fracture across the L4 vertebral body without displacement. There is also an acute L4 right pars fracture, nondisplaced. Lumbar levoscoliosis chronic pelvic tilting. No acute or posttraumatic finding in the lower abdomen and pelvis. No visible acute mild tendinous disruption. IMPRESSION: 1. Nondisplaced L4 body and right pars fractures. 2. Negative for pelvic or proximal femur fracture. 3. Bilateral total hip arthroplasty. Electronically Signed   By: Monte Fantasia M.D.   On: 02/20/2016 05:49   Ct Hip Left Wo Contrast  Result Date: 02/10/2016 CLINICAL DATA:  Status post fall, left hip pain EXAM: CT OF THE LEFT HIP WITHOUT CONTRAST TECHNIQUE: Multidetector CT imaging of the left hip was performed according to the standard protocol. Multiplanar CT image reconstructions were also generated. COMPARISON:  None. FINDINGS: Bones/Joint/Cartilage Left total hip arthroplasty. Beam hardening artifact resulting from the arthroplasty partially obscuring the adjacent soft tissue and osseous structures. No acute fracture or dislocation. No periarticular fluid  collection. No osteolysis. Normal alignment. No aggressive lytic or sclerotic osseous lesion. Degenerative changes of the left sacroiliac joint. Muscles Normal. Soft tissue No fluid collection or hematoma. No soft tissue mass. Peripheral vascular atherosclerotic disease. IMPRESSION: 1. Left total hip arthroplasty.  No acute fracture or dislocation. Electronically Signed   By: Kathreen Devoid   On: 02/10/2016 23:57   Ct Hip Right Wo Contrast  Result Date: 02/20/2016 CLINICAL DATA:  Fall 1 week ago. Severe right hip and groin pain. Initial encounter. EXAM: CT PELVIS WITHOUT CONTRAST.  CT RIGHT HIP WITHOUT CONTRAST TECHNIQUE: Multidetector CT imaging of the pelvis and right hip was performed following the standard protocol without intravenous contrast. COMPARISON:  Left hip CT 02/10/2016 FINDINGS: Negative for pelvic ring fracture or diastasis. Ventral cortical irregularity at S3 is chronic. Bilateral total hip arthroplasty with cerclage wire on the right around the intertrochanteric femur. Both hips are located. No acute periprosthetic fracture. A lucency in the right greater trochanter is corticated and chronic. Horizontal fracture across the L4 vertebral body without displacement. There is also an acute L4 right pars fracture, nondisplaced. Lumbar levoscoliosis chronic pelvic tilting. No acute or posttraumatic finding in the lower abdomen and pelvis. No visible acute mild tendinous disruption. IMPRESSION: 1. Nondisplaced L4 body and right pars fractures. 2. Negative for pelvic or proximal femur fracture. 3. Bilateral total hip arthroplasty. Electronically Signed   By: Monte Fantasia M.D.   On: 02/20/2016 05:49   Dg Chest Port 1 View  Result Date: 02/20/2016 CLINICAL DATA:  Hypoxia today, hx non-smoker, left breast cancer EXAM: PORTABLE CHEST 1 VIEW COMPARISON:  02/10/2016 FINDINGS: Heart is mildly enlarged but also accentuated by the technique. There is elevation of right hemidiaphragm which is stable.  Perihilar peribronchial thickening is noted. There are no focal consolidations. No pleural effusions or pulmonary edema. There is convex deformity in the region the AP window common warranting further evaluation. A cystic lesion has been demonstrated in this region on previous CT exam however followup is warranted given the history of left breast cancer and melanoma. Note is made of chronic changes in both shoulders indicating probable chronic rotator cuff injuries. IMPRESSION: 1. Question of enlarged AP window region mass. CT of the chest is recommended. Contrast administration is recommended unless it is  contraindicated. 2. Bronchitic changes without focal pulmonary consolidation. Electronically Signed   By: Nolon Nations M.D.   On: 02/20/2016 15:42   Dg Hand Complete Right  Result Date: 02/10/2016 CLINICAL DATA:  Right hand pain after fall at home today. Bruising in the fourth finger. EXAM: RIGHT HAND - COMPLETE 3+ VIEW COMPARISON:  None. FINDINGS: Diffuse bone demineralization. Degenerative changes throughout the interphalangeal joints, metacarpal phalangeal joints, carpometacarpal, carpal, and radiocarpal joints. Deformity of the distal interphalangeal joint of the fifth finger may indicate ligamentous injury or laxity. No acute fracture or dislocation otherwise identified. IMPRESSION: Prominent degenerative changes throughout the right hand and wrist. No acute fracture or dislocation is suggested. Deformity of the fifth finger likely represents old ligamentous injury. Electronically Signed   By: Lucienne Capers M.D.   On: 02/10/2016 21:26   Dg Hip Unilat With Pelvis 2-3 Views Left  Result Date: 02/10/2016 CLINICAL DATA:  Left hip pain after falling in the bathroom at home today. EXAM: DG HIP (WITH OR WITHOUT PELVIS) 2-3V LEFT COMPARISON:  None. FINDINGS: Bilateral total hip prostheses. There is a small, linear avulsion fracture involving the lateral aspect of the proximal femoral shaft. Moderate  levoconvex lumbar rotary scoliosis. IMPRESSION: Small, linear avulsion fracture involving the lateral aspect of the proximal femoral shaft. This could be acute or old. Electronically Signed   By: Claudie Revering M.D.   On: 02/10/2016 19:41   Dg Hip Unilat With Pelvis 2-3 Views Right  Result Date: 02/13/2016 CLINICAL DATA:  Recent fall in the bathroom. Right hip pain. Prior bilateral hip arthroplasties. EXAM: DG HIP (WITH OR WITHOUT PELVIS) 2-3V RIGHT COMPARISON:  02/13/2015. FINDINGS: Prior right hip arthroplasty with anatomic alignment. Extensive myositis ossifications, unchanged. No evidence of acute fracture or dislocation. Symphysis pubis and right sacroiliac joints intact with mild degenerative changes. IMPRESSION: No acute osseous abnormality. Prior right hip arthroplasty with anatomic alignment. Electronically Signed   By: Evangeline Dakin M.D.   On: 02/13/2016 13:40   Dg Femur 1v Left  Result Date: 02/10/2016 CLINICAL DATA:  Possible left femur fracture by prior plain films. EXAM: SINGLE VIEW OF THE LEFT FEMUR COMPARISON:  Single view of the left femur this same day. FINDINGS: Left hip replacement is again seen. There is a small linear focus of cortical thickening along the proximal diaphysis left femur. No fractures identified. IMPRESSION: No fracture is seen. Small focus of cortical thickening along the proximal diaphysis of the left femur will appears chronic and may be due to remote stress change. Electronically Signed   By: Inge Rise M.D.   On: 02/10/2016 21:25    ASSESSMENT/PLAN:  Generalized weakness - for home health PT, OT, CNA and nursing  L4 nondisplaced fracture - continue OxyContin 10 mg 1 tab by mouth every 12 hours, Percocet 5/325 mg 1 tab by mouth every 6 hours when necessary and Tylenol ES 500 mg take 2 tabs = 1000 mg by mouth every 6 hours when necessary; continue lumbar belt  Constipation - continue MOM suspension 800 mg/5 ML gives 15 ML by mouth daily, senna 8.6 mg 1  tab by mouth daily and MiraLAX 17 g by mouth daily  Depression - mood is stable; continue Cymbalta 60 mg 1 capsule by mouth daily  Chronic leg edema - continue Lasix 20 mg 1 tab by mouth every day when necessary  Rheumatoid arthritis - continue Plaquenil 200 mg 1 tab by mouth daily  Anemia of chronic disease - stable Lab Results  Component Value Date  HGB 9.4 (L) 02/23/2016   Chronic kidney disease, stage 3 - stable 03/02/16  Creatinine 1.08  GFR 5.38  Abnormal chest finding - has cystic fluid concerning or necrotic lymph node VS metastases; will need repeat CT chest in 3 months and outpatient follow-up; no SOB      I have filled out patient's discharge paperwork and written prescriptions.  Patient will receive home health PT, OT, Nursing and CNA.  DME provided:  None  Total discharge time: Less than 30 minutes  Discharge time involved coordination of the discharge process with social worker, nursing staff and therapy department. Medical justification for home health services verified.    Durenda Age, NP [employer] [phone]

## 2016-03-05 NOTE — Telephone Encounter (Signed)
Message left on triage voicemail: Please call to discuss process for Prior Authorization for hospital bed and wheelchair.  I called patients daughter back, she spoke with a representative at [facility], they are processing request and will get the appropriate documents to the correct person for completion. PA may need to come from the hospital that initiated orders, Patrick [facility], PCP or [place]. [name] will call back if she needs any assistance from [place]

## 2016-03-16 ENCOUNTER — Other Ambulatory Visit: Payer: Self-pay | Admitting: *Deleted

## 2016-03-16 NOTE — Patient Outreach (Signed)
[facility]) Care Management  03/16/2016  CAMEREN ODWYER 1922-05-18 [id number]  [place] drove to [facility], [place] where patient was residing to receive short-term rehabilitative services, only to discover that patient had been discharged on March 05, 2016, while CSW was on vacation.  CSW was able to make contact with patient today by phone to confirm that patient was able to return home to live with her daughter, Lillia Carmel, along with home health services through [facility].  Patient admits that she is getting along well, with assistance received from Mrs. Convinus.  CSW requested to speak with Mrs. Convinus to assess for any additional social work needs; however, patient reported that she was unavailable at the time of CSW's call.  Patient reports that she is unable to identify any social work needs at present.  Therefore, CSW provided patient with CSW's contact information, encouraging patient to contact CSW directly if social work needs arise in the near future.  Patient voiced understanding and was agreeable to this plan. CSW will perform a case closure on patient, as all goals of treatment have been met from social work standpoint and no additional social work needs have been identified at this time.  CSW will notify patient's RNCM with [facility], Valente David of CSW's plans to close patient's case.  CSW will fax an update to patient's Primary Care Physician, Dr. Sherrie Mustache to ensure that they are aware of CSW's involvement with patient's plan of care.  CSW will submit a case closure request to Verlon Setting, Care Management Assistant with [facility], in the form of an [employer].  CSW will ensure that Mrs. Comer is aware of Roma Schanz, RNCM with [facility], continued involvement with patient's care. Nat Christen, BSW, MSW, LCSW  [facility]  Mailing [place] [address] [place], [place] [address] Physical Address-[address], [place], [place] [address] Toll Free Main # [phone] Fax # [phone] Cell # [phone]  Fax # [phone]  [email or web address]

## 2016-03-17 ENCOUNTER — Other Ambulatory Visit: Payer: Self-pay | Admitting: *Deleted

## 2016-03-17 NOTE — Patient Outreach (Signed)
[facility]) Care Management  03/17/2016  JOLYNE OXENDINE 1921/09/29 [id number]   Notified by LCSW that member was discharged from SNF.  Call placed to [facility] caregiver/daughter to initiate transition of care.  No answer, HIPAA compliant voice message left.  Will await call back.  If no call back, will make another attempt within the next 3 days.  Valente David, [place], MSN [place] [phone]

## 2016-03-19 ENCOUNTER — Other Ambulatory Visit: Payer: Self-pay | Admitting: *Deleted

## 2016-03-19 NOTE — Patient Outreach (Signed)
[facility]) Care Management  03/19/2016  DORAN DEC 12-03-1921 [id number]   2nd attempt made to contact member's daughter, [name], to initiate transition of care program since discharge from facility without success.  HIPAA compliant voice message left.  Will make 3rd attempt tomorrow.  Valente David, [place], MSN [place] [phone]

## 2016-03-20 ENCOUNTER — Encounter: Payer: Self-pay | Admitting: *Deleted

## 2016-03-20 ENCOUNTER — Other Ambulatory Visit: Payer: Self-pay | Admitting: *Deleted

## 2016-03-20 NOTE — Patient Outreach (Signed)
[facility]) Care Management  03/20/2016  CINZIA BOYERS 08-11-1921 [id number]   3rd attempt made to contact member's caregiver/daughter to initiate transition of care.  Spoke to [employer], she state that the member is doing "very well."  She report that she has all DME that was requested and denies any concerns managing her care.  She declines further involvement at this time.  Will notify care management assistant and PCP of case closure.  Valente David, [place], MSN [place] [phone]

## 2016-03-23 ENCOUNTER — Ambulatory Visit: Payer: PPO | Admitting: Nurse Practitioner

## 2016-04-07 ENCOUNTER — Ambulatory Visit (INDEPENDENT_AMBULATORY_CARE_PROVIDER_SITE_OTHER): Payer: PPO | Admitting: Nurse Practitioner

## 2016-04-07 ENCOUNTER — Encounter: Payer: Self-pay | Admitting: Nurse Practitioner

## 2016-04-07 VITALS — BP 108/72 | HR 84 | Temp 97.8°F | Resp 18 | Ht 64.0 in | Wt 128.0 lb

## 2016-04-07 DIAGNOSIS — D638 Anemia in other chronic diseases classified elsewhere: Secondary | ICD-10-CM | POA: Diagnosis not present

## 2016-04-07 DIAGNOSIS — R938 Abnormal findings on diagnostic imaging of other specified body structures: Secondary | ICD-10-CM | POA: Diagnosis not present

## 2016-04-07 DIAGNOSIS — S32048S Other fracture of fourth lumbar vertebra, sequela: Secondary | ICD-10-CM

## 2016-04-07 DIAGNOSIS — R634 Abnormal weight loss: Secondary | ICD-10-CM

## 2016-04-07 DIAGNOSIS — R600 Localized edema: Secondary | ICD-10-CM | POA: Diagnosis not present

## 2016-04-07 DIAGNOSIS — C439 Malignant melanoma of skin, unspecified: Secondary | ICD-10-CM | POA: Diagnosis not present

## 2016-04-07 DIAGNOSIS — N183 Chronic kidney disease, stage 3 (moderate): Secondary | ICD-10-CM

## 2016-04-07 DIAGNOSIS — K5900 Constipation, unspecified: Secondary | ICD-10-CM

## 2016-04-07 DIAGNOSIS — R9389 Abnormal findings on diagnostic imaging of other specified body structures: Secondary | ICD-10-CM

## 2016-04-07 LAB — CBC WITH DIFFERENTIAL/PLATELET
BASOS ABS: 0 {cells}/uL (ref 0–200)
BASOS PCT: 0 %
EOS PCT: 1 %
Eosinophils Absolute: 74 cells/uL (ref 15–500)
HCT: 33 % — ABNORMAL LOW (ref 35.0–45.0)
HEMOGLOBIN: 10.8 g/dL — AB (ref 11.7–15.5)
LYMPHS ABS: 1480 {cells}/uL (ref 850–3900)
Lymphocytes Relative: 20 %
MCH: 27.6 pg (ref 27.0–33.0)
MCHC: 32.7 g/dL (ref 32.0–36.0)
MCV: 84.4 fL (ref 80.0–100.0)
MONOS PCT: 7 %
MPV: 8.6 fL (ref 7.5–12.5)
Monocytes Absolute: 518 cells/uL (ref 200–950)
NEUTROS ABS: 5328 {cells}/uL (ref 1500–7800)
Neutrophils Relative %: 72 %
PLATELETS: 285 10*3/uL (ref 140–400)
RBC: 3.91 MIL/uL (ref 3.80–5.10)
RDW: 14.9 % (ref 11.0–15.0)
WBC: 7.4 10*3/uL (ref 3.8–10.8)

## 2016-04-07 MED ORDER — OXYCODONE-ACETAMINOPHEN 5-325 MG PO TABS: 1.0000 | ORAL_TABLET | Freq: Four times a day (QID) | ORAL | 0 refills | Status: DC | PRN

## 2016-04-07 NOTE — Progress Notes (Signed)
Careteam: Patient Care Team: [name], NP as PCP - General (Nurse Practitioner) Barrie Folk, MD (Inactive) as Referring Physician (Dermatology) Ladell Pier, MD as Consulting Physician (Oncology) Barrie Folk, MD (Inactive) as Referring Physician (Dermatology)  Advanced Directive information Does patient have an advance directive?: Yes, Type of Advance Directive: [place];Living will;Out of facility DNR (pink MOST or yellow form)  No Known Allergies  Chief Complaint  Patient presents with  . Medical Management of Chronic Issues    3 month follow up.      HPI: Patient is a 82 y.o. female seen in the office today for routine follow up and follow up from nursing facility. Pt was at [facility] from 02/23/16-03/05/16 due to nondisplaced L4 lumbar vertebrae fracture.  Pt was managed with pain medication.  Also noted CT of chest done and showed 3.5 cm fluid vs pericardial cyst with concern for possible necrotic lymph node vs mets (pt with melanoma) which will need follow up CT of the chest for further evaluation. Pt is now at home with daughter.  2 more falls since she has been home. Daughter now has BSC at bedside. Having to be supervised when in the bathroom. Trying to work on strength but worried about safely  Bowels moving good on MOM Has hemorrhoids which her daughter has been trying to work with, not making much progress.   Missed appt with oncologist in July- now with red places on her face questions if melanoma has returned on her face  Increased fatigue- good and bad days, does not have a lot going on.   Weight loss- not eating as much as she has in the past, does not do much activity.   Daughter tries to engage her in activities. She does not like to do things outside of the house.   Review of Systems:  Review of Systems  Constitutional: Negative for chills and fever.  HENT: Positive for hearing loss. Negative for  congestion.   Eyes: Negative for pain.  Respiratory: Negative for cough, shortness of breath and wheezing.   Cardiovascular: Negative for chest pain, palpitations and leg swelling.  Gastrointestinal: Negative for abdominal pain, blood in stool and constipation.  Genitourinary: Negative for dysuria.  Musculoskeletal: Positive for arthralgias, back pain and gait problem.  Neurological: Positive for weakness. Negative for dizziness and headaches.  Psychiatric/Behavioral: Negative for dysphoric mood.    Past Medical History:  Diagnosis Date  . Allergy   . Anemia   . Blood transfusion without reported diagnosis   . Cancer ([place])   . Cataract   . Melanoma ([place])   . Rheumatoid arthritis(714.0)   . UTI (urinary tract infection)    Past Surgical History:  Procedure Laterality Date  . APPENDECTOMY    . BREAST SURGERY Left 2010   ? breast cancer  . FRACTURE SURGERY    . hip replacement Bilateral   . JOINT REPLACEMENT    . MASTECTOMY Left    Social History:   has an unknown smoking status. She has never used smokeless tobacco. She reports that she does not drink alcohol or use drugs.  Family History  Problem Relation Age of Onset  . Emphysema Father     smoked and was exposed to mustard gas  . COPD Father   . Cancer Brother   . Cancer Sister   . Cancer Sister     esophageal  . Hypertension Daughter   . Hypertension Son  Medications: Patient's Medications  New Prescriptions   No medications on file  Previous Medications   ACETAMINOPHEN (TYLENOL) 500 MG TABLET    Take 500-1,000 mg by mouth every 6 (six) hours as needed for mild pain.   BARRIER CREAM (NON-SPECIFIED) CREA    Apply 1 application topically 2 (two) times daily as needed (To prevent bedsores.).    DULOXETINE (CYMBALTA) 60 MG CAPSULE    TAKE 1 CAPSULE (60 MG TOTAL) BY MOUTH DAILY.   FUROSEMIDE (LASIX) 20 MG TABLET    TAKE 1 TABLET BY MOUTH EVERY DAY AS NEEDED FOR EDEMA   HYDROXYCHLOROQUINE (PLAQUENIL) 200 MG  TABLET    TAKE 1 TABLET (200 MG TOTAL) BY MOUTH DAILY.   LIDOCAINE-PRILOCAINE (EMLA) CREAM    Apply 1 application topically as needed (Apply to lesions undergoing injection 30 mins prior to treatment.).    MAGNESIUM HYDROXIDE (MILK OF MAGNESIA) 800 MG/5ML SUSPENSION    Take 15 mLs by mouth daily.    MULTIPLE VITAMIN (MULTIVITAMIN) TABLET    Take 1 tablet by mouth daily.   NEOMYCIN-BACITRACIN-POLYMYXIN (NEOSPORIN) [phone] OINTMENT    Apply topically 3 (three) times daily. Right shin abrasion   NYSTATIN (MYCOSTATIN/NYSTOP) 100000 UNIT/GM POWD    Twice daily as needed to affected area   OVER THE COUNTER MEDICATION    Place 2 drops into both eyes as needed (For eye irritation.). [employer] Eye Allergy Relief Eye Drops    OXYCODONE (OXYCONTIN) 10 MG 12 HR TABLET    Take 10 mg by mouth every 12 (twelve) hours.   OXYCODONE-ACETAMINOPHEN (PERCOCET) 5-325 MG TABLET    Take 1-2 tablets by mouth every 6 (six) hours as needed for severe pain.   PROCHLORPERAZINE (COMPAZINE) 10 MG TABLET    Take 10 mg by mouth every 6 (six) hours as needed for nausea or vomiting.  Modified Medications   No medications on file  Discontinued Medications   POLYETHYLENE GLYCOL (MIRALAX / GLYCOLAX) PACKET    Take 17 g by mouth daily.   SENNA (SENOKOT) 8.6 MG TABLET    Take 1 tablet by mouth daily.   TALIMOGENE LAHERPAREPVEC (IMLYGIC) 1000000 UNIT/ML SUSP    1 each by Intralesional route every 14 (fourteen) days.      Physical Exam:  Vitals:   04/07/16 1425  BP: 108/72  Pulse: 84  Resp: 18  Temp: 97.8 F (36.6 C)  TempSrc: Oral  SpO2: 95%  Weight: 128 lb (58.1 kg)  Height: 5\' 4"  (1.626 m)   Body mass index is 21.97 kg/m.  Physical Exam  Constitutional: She is oriented to person, place, and time. She appears well-developed. No distress.  Frail elderly female in NAD  HENT:  Head: Normocephalic and atraumatic.  Mouth/Throat: Oropharynx is clear and moist. No oropharyngeal exudate.  Eyes: Conjunctivae are normal.  Pupils are equal, round, and reactive to light.  Neck: Normal range of motion. Neck supple.  Cardiovascular: Normal rate, regular rhythm and normal heart sounds.   Occasional irregular beat  Pulmonary/Chest: Effort normal and breath sounds normal. No respiratory distress.  Abdominal: Soft. Bowel sounds are normal.  Musculoskeletal: She exhibits edema (trace).  Neurological: She is alert and oriented to person, place, and time.  Skin: Skin is warm and dry. She is not diaphoretic.  Discoloration to forehead from skin removal, lesion healed  Multiple small red areas to left temporal region   Psychiatric: She has a normal mood and affect.    Labs reviewed: Basic Metabolic Panel:  Recent Labs  02/20/16 1021  02/21/16 0442 02/22/16 0436 02/23/16 0416 02/26/16  NA  --  138 138 138 140  K  --  4.5 4.7 5.3* 5.4*  CL  --  104 106 105  --   CO2  --  27 27 27   --   GLUCOSE  --  101* 97 97  --   BUN  --  23* 24* 27* 24*  CREATININE  --  1.14* 1.15* 1.20* 1.1  CALCIUM  --  8.6* 8.6* 8.8*  --   TSH 1.511  --   --   --   --    Liver Function Tests:  Recent Labs  06/03/15 1904 12/16/15 1452  AST 30 16  ALT 23 15  ALKPHOS 65 70  BILITOT 0.4 0.2  PROT 7.5 6.6  ALBUMIN 3.8 3.9   No results for input(s): LIPASE, AMYLASE in the last 8760 hours. No results for input(s): AMMONIA in the last 8760 hours. CBC:  Recent Labs  12/16/15 1452 02/10/16 1947 02/20/16 0440 02/21/16 0442 02/22/16 0436 02/23/16 0416  WBC 7.0 10.7* 8.7 7.1 8.1 8.7  NEUTROABS 5.1 9.0* 6.1  --   --   --   HGB  --  11.5* 7.7* 9.7* 9.5* 9.4*  HCT 30.8* 34.8* 24.4* 30.7* 29.8* 30.5*  MCV 84 85.9 89.4 88.7 88.4 90.8  PLT 299 285 400 310 336 331   Lipid Panel: No results for input(s): CHOL, HDL, LDLCALC, TRIG, CHOLHDL, LDLDIRECT in the last 8760 hours. TSH:  Recent Labs  02/20/16 1021  TSH 1.511   A1C: Lab Results  Component Value Date   HGBA1C 5.6 02/20/2016     Assessment/Plan 1. Other closed  fracture of fourth lumbar vertebra, sequela ([place]) -ongoing therapy, pt is very debilitated from fall. Pain well controlled on current pain regimen  - oxyCODONE-acetaminophen (PERCOCET) 5-325 MG tablet; Take 1-2 tablets by mouth every 6 (six) hours as needed for severe pain.  Dispense: 120 tablet; Refill: 0  2. Constipation, unspecified constipation type Daughter feels like she has constipation under good control with home regimen. To cont current medication at this time  3. Abnormal CT of the chest CT angio of the chest was obtained in the hospital and showed 3.5 fluid versus pericardial cyst, Per radiology this is less likely to be necrotic lymph node or metastasis, results discussed with both daughters in hospital.  - CT Chest Wo Contrast; Future  4. Anemia of chronic disease -follow up CBC with Differential/Platelet  5. Bilateral edema of lower extremity Currently without swelling, cont lasix and elevation of LE  6. Chronic kidney disease (CKD), stage 3 (moderate) -encourage hydration  - COMPLETE METABOLIC PANEL WITH GFR  7. Melanoma of skin ([place]) Possible recurrent lesions to left side of face, missed last appt with oncology, plans to follow up now that she is home.  8. Loss of weight -to make meal times more social, liberalize diet. Ensure between meals -does not wish to be on medication for appetite.   Follow up in 2 months, sooner if needed   Deandre Brannan K. Harle Battiest  [facility] [phone] 8 am - 5 pm) [phone] (after hours)

## 2016-04-08 ENCOUNTER — Other Ambulatory Visit: Payer: Self-pay

## 2016-04-08 DIAGNOSIS — E875 Hyperkalemia: Secondary | ICD-10-CM

## 2016-04-08 LAB — COMPLETE METABOLIC PANEL WITH GFR
ALBUMIN: 4 g/dL (ref 3.6–5.1)
ALK PHOS: 85 U/L (ref 33–130)
ALT: 15 U/L (ref 6–29)
AST: 22 U/L (ref 10–35)
BILIRUBIN TOTAL: 0.4 mg/dL (ref 0.2–1.2)
BUN: 22 mg/dL (ref 7–25)
CO2: 22 mmol/L (ref 20–31)
CREATININE: 1.14 mg/dL — AB (ref 0.60–0.88)
Calcium: 9.5 mg/dL (ref 8.6–10.4)
Chloride: 101 mmol/L (ref 98–110)
GFR, EST NON AFRICAN AMERICAN: 42 mL/min — AB (ref >=60)
GFR, Est African American: 48 mL/min — ABNORMAL LOW (ref >=60)
GLUCOSE: 97 mg/dL (ref 65–99)
Potassium: 5.8 mmol/L — ABNORMAL HIGH (ref 3.5–5.3)
SODIUM: 138 mmol/L (ref 135–146)
TOTAL PROTEIN: 7.1 g/dL (ref 6.1–8.1)

## 2016-04-13 ENCOUNTER — Other Ambulatory Visit: Payer: Self-pay

## 2016-04-23 DIAGNOSIS — C439 Malignant melanoma of skin, unspecified: Secondary | ICD-10-CM | POA: Diagnosis not present

## 2016-04-23 DIAGNOSIS — N182 Chronic kidney disease, stage 2 (mild): Secondary | ICD-10-CM | POA: Diagnosis not present

## 2016-04-23 DIAGNOSIS — S32000G Wedge compression fracture of unspecified lumbar vertebra, subsequent encounter for fracture with delayed healing: Secondary | ICD-10-CM | POA: Diagnosis not present

## 2016-04-23 DIAGNOSIS — Z9181 History of falling: Secondary | ICD-10-CM | POA: Diagnosis not present

## 2016-04-28 ENCOUNTER — Other Ambulatory Visit: Payer: Self-pay | Admitting: Nurse Practitioner

## 2016-04-28 DIAGNOSIS — S32048S Other fracture of fourth lumbar vertebra, sequela: Secondary | ICD-10-CM

## 2016-04-29 ENCOUNTER — Other Ambulatory Visit: Payer: Self-pay | Admitting: Nurse Practitioner

## 2016-04-29 DIAGNOSIS — S32048S Other fracture of fourth lumbar vertebra, sequela: Secondary | ICD-10-CM

## 2016-05-07 ENCOUNTER — Other Ambulatory Visit: Payer: PPO

## 2016-05-07 ENCOUNTER — Other Ambulatory Visit: Payer: Self-pay

## 2016-05-07 DIAGNOSIS — S32048S Other fracture of fourth lumbar vertebra, sequela: Secondary | ICD-10-CM

## 2016-05-07 MED ORDER — OXYCODONE-ACETAMINOPHEN 5-325 MG PO TABS: 1.0000 | ORAL_TABLET | Freq: Four times a day (QID) | ORAL | 0 refills | Status: DC | PRN

## 2016-05-07 NOTE — Telephone Encounter (Signed)
Spoke with daughter and advised results.  

## 2016-05-28 ENCOUNTER — Ambulatory Visit: Payer: PPO | Admitting: Podiatry

## 2016-06-07 ENCOUNTER — Other Ambulatory Visit: Payer: Self-pay | Admitting: Internal Medicine

## 2016-06-07 DIAGNOSIS — S32048S Other fracture of fourth lumbar vertebra, sequela: Secondary | ICD-10-CM

## 2016-06-08 MED ORDER — OXYCODONE-ACETAMINOPHEN 5-325 MG PO TABS: 1.0000 | ORAL_TABLET | Freq: Four times a day (QID) | ORAL | 0 refills | Status: DC | PRN

## 2016-06-09 ENCOUNTER — Ambulatory Visit: Payer: PPO | Admitting: Nurse Practitioner

## 2016-06-11 ENCOUNTER — Encounter: Payer: Self-pay | Admitting: Podiatry

## 2016-06-11 ENCOUNTER — Ambulatory Visit (INDEPENDENT_AMBULATORY_CARE_PROVIDER_SITE_OTHER): Payer: PPO | Admitting: Podiatry

## 2016-06-11 VITALS — Ht 64.0 in | Wt 128.0 lb

## 2016-06-11 DIAGNOSIS — L84 Corns and callosities: Secondary | ICD-10-CM

## 2016-06-11 DIAGNOSIS — L03031 Cellulitis of right toe: Secondary | ICD-10-CM

## 2016-06-11 DIAGNOSIS — M79609 Pain in unspecified limb: Secondary | ICD-10-CM

## 2016-06-11 DIAGNOSIS — B351 Tinea unguium: Secondary | ICD-10-CM | POA: Diagnosis not present

## 2016-06-11 DIAGNOSIS — G629 Polyneuropathy, unspecified: Secondary | ICD-10-CM

## 2016-06-11 DIAGNOSIS — L02611 Cutaneous abscess of right foot: Secondary | ICD-10-CM | POA: Diagnosis not present

## 2016-06-11 MED ORDER — CEPHALEXIN 500 MG PO CAPS: 500.0000 mg | ORAL_CAPSULE | Freq: Four times a day (QID) | ORAL | 0 refills | Status: DC

## 2016-06-12 NOTE — Progress Notes (Signed)
   Subjective:    Patient ID: Patty Walters, female    DOB: Apr 20, 1922, 82 y.o.   MRN: [id number]  HPI  PT REQUESTING FOR TOENAILS DEBRIDEMENT. This patient presents to the office for nail care for both feet. She says her nails are painful walking and wearing her shoes.  She has significant swelling both feet. She has developed painful areas on the tip of her fourth toe left and third toe right.  She presents for preventive foot care services. Review of Systems  HENT: Positive for hearing loss.   Cardiovascular: Positive for leg swelling.  Musculoskeletal: Positive for joint swelling and gait problem.       Objective:   Physical Exam GENERAL APPEARANCE: Alert, conversant. Appropriately groomed. No acute distress.  VASCULAR: Pedal pulses not  palpable at  [facility] and PT bilateral due to severity of swelling both feet.  Capillary refill time is immediate to all digits,  Cold feet noted.  NEUROLOGIC: sensation is normal to 5.07 monofilament at 5/5 sites bilateral.  Light touch is intact bilateral, Muscle strength normal.  MUSCULOSKELETAL: acceptable muscle strength, tone and stability bilateral.  Intrinsic muscluature intact bilateral.  Rectus appearance of foot and digits noted bilateral.   DERMATOLOGIC: skin color, texture, and turgor are within normal limits.  No preulcerative lesions or ulcers  are seen, no interdigital maceration noted.  No open lesions present.   No drainage noted. Distal clavi noted fourth toe left foot and third toe right foot. Red swollen painful big toe right foot.  Redness and swelling extends over MPJ  Right foot. NAILS  Thick disfigured discolored nails both feet.There is palpable pain upon debridement of hallux toenail.         Assessment & Plan:  Onychomycosis  B/L  Distal clavi B/     Debridement of nails    Debridement of clavi B/L.  Upon debridement of her mycotic nails I found pain out of proportion right hallux nail.  Further evaluation revealed abscess  subungually.  After digital block the pus was evacuated subungually.  Neosporin/DSD applied.  Cephalexin  500 mg. Was prescribed.  Home soaks were given.   RTC 3 months for nails.  To return one week as needed for subungual abscess.     Gardiner Barefoot DPM  Gardiner Barefoot DPM

## 2016-06-18 ENCOUNTER — Ambulatory Visit: Payer: PPO | Admitting: Nurse Practitioner

## 2016-06-30 ENCOUNTER — Ambulatory Visit: Payer: Self-pay | Admitting: Nurse Practitioner

## 2016-07-01 ENCOUNTER — Emergency Department (HOSPITAL_COMMUNITY)
Admission: EM | Admit: 2016-07-01 | Discharge: 2016-07-01 | Disposition: A | Discharge status: Home / Self Care | Payer: PPO | Attending: Emergency Medicine | Admitting: Emergency Medicine

## 2016-07-01 ENCOUNTER — Encounter (HOSPITAL_COMMUNITY): Payer: Self-pay | Admitting: Emergency Medicine

## 2016-07-01 DIAGNOSIS — Z85820 Personal history of malignant melanoma of skin: Secondary | ICD-10-CM | POA: Diagnosis not present

## 2016-07-01 DIAGNOSIS — K623 Rectal prolapse: Secondary | ICD-10-CM | POA: Diagnosis not present

## 2016-07-01 LAB — COMPREHENSIVE METABOLIC PANEL
ALK PHOS: 71 U/L (ref 38–126)
ALT: 16 U/L (ref 14–54)
AST: 23 U/L (ref 15–41)
Albumin: 3.7 g/dL (ref 3.5–5.0)
Anion gap: 8 (ref 5–15)
BUN: 29 mg/dL — ABNORMAL HIGH (ref 6–20)
CALCIUM: 8.8 mg/dL — AB (ref 8.9–10.3)
CO2: 25 mmol/L (ref 22–32)
CREATININE: 1.26 mg/dL — AB (ref 0.44–1.00)
Chloride: 107 mmol/L (ref 101–111)
GFR, EST AFRICAN AMERICAN: 42 mL/min — AB (ref >60)
GFR, EST NON AFRICAN AMERICAN: 36 mL/min — AB (ref >60)
Glucose, Bld: 122 mg/dL — ABNORMAL HIGH (ref 65–99)
Potassium: 4 mmol/L (ref 3.5–5.1)
Sodium: 140 mmol/L (ref 135–145)
Total Bilirubin: 0.4 mg/dL (ref 0.3–1.2)
Total Protein: 7 g/dL (ref 6.5–8.1)

## 2016-07-01 LAB — TYPE AND SCREEN
ABO/RH(D): B NEG
ANTIBODY SCREEN: POSITIVE
DAT, IgG: NEGATIVE

## 2016-07-01 LAB — CBC
HCT: 31.1 % — ABNORMAL LOW (ref 36.0–46.0)
Hemoglobin: 9.9 g/dL — ABNORMAL LOW (ref 12.0–15.0)
MCH: 28.5 pg (ref 26.0–34.0)
MCHC: 31.8 g/dL (ref 30.0–36.0)
MCV: 89.6 fL (ref 78.0–100.0)
PLATELETS: 334 10*3/uL (ref 150–400)
RBC: 3.47 MIL/uL — AB (ref 3.87–5.11)
RDW: 14 % (ref 11.5–15.5)
WBC: 6.1 10*3/uL (ref 4.0–10.5)

## 2016-07-01 LAB — ABO/RH: ABO/RH(D): B NEG

## 2016-07-01 NOTE — ED Triage Notes (Signed)
Daughter reports pt been having rectal bleed for several months, sts thought was hemorrhoids. Daughter sts another daughter checked pt's rectum and noticed prolapsed rectum . Pt denies pain . Yet reports weakness. Alert and oriented x 4.

## 2016-07-01 NOTE — ED Notes (Signed)
Currently the prolapse is reduced and not visible.

## 2016-07-01 NOTE — ED Provider Notes (Signed)
[facility] [facility] DEPT Provider Note   CSN: [id number] Arrival date & time: 07/01/16  1721     History   Chief Complaint Chief Complaint  Patient presents with  . Rectal Bleeding    HPI Patty Walters is a 82 y.o. female.  Patient presents with complaint of rectal prolapse. Patient has a history of anemia of chronic disease, melanoma. Daughter has been treating what she thought was a large hemorrhoid for the past several months. Patient's sister, who is a Designer, jewellery, recently pointed out that this is not a hemorrhoid but a rectal prolapse. Patient presents to the emergency department today because she was unable to get an appointment with her primary care physician or a colorectal surgeon. Daughter has been treating at home with steroid creams and good hygiene. At the time of the emergency department visit, prolapse is completely reduced. Patient has a picture of the prolapse on her phone. Patient currently has no complaints. Daughter states that the area bleeds slightly at times, but no ongoing bleeding. No other complaints. The onset of this condition was incidious. The course is constant. Aggravating factors: none. Alleviating factors: none.        Past Medical History:  Diagnosis Date  . Allergy   . Anemia   . Blood transfusion without reported diagnosis   . Cancer ([place])   . Cataract   . Melanoma ([place])   . Rheumatoid arthritis(714.0)   . UTI (urinary tract infection)     Patient Active Problem List   Diagnosis Date Noted  . Lumbar vertebral fracture ([place]) 02/20/2016  . Melanoma ([place]) 04/05/2015  . Constipation 02/21/2015  . Edema 02/21/2015  . Melanoma of skin ([place]) 12/27/2014  . Cough 06/13/2013  . Chronic anemia 02/29/2012  . Insomnia 09/10/2011  . Risk for falls 09/10/2011  . Neuropathy ([place]) 09/10/2011  . Rheumatoid arthritis ([place]) 09/10/2011    Past Surgical History:  Procedure Laterality Date  . APPENDECTOMY    . BREAST SURGERY Left 2010   ?  breast cancer  . FRACTURE SURGERY    . hip replacement Bilateral   . JOINT REPLACEMENT    . MASTECTOMY Left     OB History    No data available       Home Medications    Prior to Admission medications   Medication Sig Start Date End Date Taking? Authorizing Provider  acetaminophen (TYLENOL) 500 MG tablet Take 500-1,000 mg by mouth every 6 (six) hours as needed for mild pain.    Historical Provider, MD  barrier cream (NON-SPECIFIED) CREA Apply 1 application topically 2 (two) times daily as needed (To prevent bedsores.).     Historical Provider, MD  cephALEXin (KEFLEX) 500 MG capsule Take 1 capsule (500 mg total) by mouth 4 (four) times daily. 06/11/16   Gardiner Barefoot, DPM  DULoxetine (CYMBALTA) 60 MG capsule TAKE 1 CAPSULE (60 MG TOTAL) BY MOUTH DAILY. 01/13/16   [name], NP  furosemide (LASIX) 20 MG tablet TAKE 1 TABLET BY MOUTH EVERY DAY AS NEEDED FOR EDEMA 02/27/16   [name], DO  hydroxychloroquine (PLAQUENIL) 200 MG tablet TAKE 1 TABLET (200 MG TOTAL) BY MOUTH DAILY. 01/31/16   [name], NP  lidocaine-prilocaine (EMLA) cream Apply 1 application topically as needed (Apply to lesions undergoing injection 30 mins prior to treatment.).     Historical Provider, MD  magnesium hydroxide (MILK OF MAGNESIA) 800 MG/5ML suspension Take 15 mLs by mouth daily.     Historical Provider, MD  Multiple Vitamin (MULTIVITAMIN) tablet Take 1 tablet by mouth daily.    Historical Provider, MD  neomycin-bacitracin-polymyxin (NEOSPORIN) [phone] ointment Apply topically 3 (three) times daily. Right shin abrasion 02/22/16   Verlee Monte, MD  nystatin (MYCOSTATIN/NYSTOP) 100000 UNIT/GM POWD Twice daily as needed to affected area 09/03/15   [name], NP  OVER THE COUNTER MEDICATION Place 2 drops into both eyes as needed (For eye irritation.). [employer] Eye Allergy Relief Eye Drops     Historical Provider, MD  oxyCODONE-acetaminophen (PERCOCET) 5-325 MG tablet Take 1-2 tablets by mouth  every 6 (six) hours as needed for severe pain. 06/08/16   [name], DO  prochlorperazine (COMPAZINE) 10 MG tablet Take 10 mg by mouth every 6 (six) hours as needed for nausea or vomiting.    Historical Provider, MD    Family History Family History  Problem Relation Age of Onset  . Emphysema Father     smoked and was exposed to mustard gas  . COPD Father   . Cancer Brother   . Cancer Sister   . Cancer Sister     esophageal  . Hypertension Daughter   . Hypertension Son     Social History Social History  Substance Use Topics  . Smoking status: Unknown If Ever Smoked  . Smokeless tobacco: Never Used  . Alcohol use No     Allergies   Patient has no known allergies.   Review of Systems Review of Systems  Constitutional: Negative for fever.  HENT: Negative for rhinorrhea and sore throat.   Eyes: Negative for redness.  Respiratory: Negative for cough.   Cardiovascular: Negative for chest pain.  Gastrointestinal: Positive for blood in stool and rectal pain (rectal prolapse). Negative for abdominal pain, diarrhea, nausea and vomiting.  Genitourinary: Negative for dysuria.  Musculoskeletal: Negative for myalgias.  Skin: Negative for rash.  Neurological: Negative for headaches.     Physical Exam Updated Vital Signs BP 160/58   Pulse 84   Temp 98.2 F (36.8 C)   Resp 18   SpO2 100%   Physical Exam  Constitutional: She appears well-developed and well-nourished.  HENT:  Head: Normocephalic and atraumatic.  Eyes: Conjunctivae are normal.  Neck: Normal range of motion. Neck supple.  Pulmonary/Chest: No respiratory distress.  Genitourinary: Rectal exam shows no external hemorrhoid, no internal hemorrhoid, no fissure and no mass.  Genitourinary Comments: Rectal prolapse currently reduced.   Neurological: She is alert.  Skin: Skin is warm and dry.  Psychiatric: She has a normal mood and affect.  Nursing note and vitals reviewed.    ED Treatments / Results    Labs (all labs ordered are listed, but only abnormal results are displayed) Labs Reviewed  COMPREHENSIVE METABOLIC PANEL - Abnormal; Notable for the following:       Result Value   Glucose, Bld 122 (*)    BUN 29 (*)    Creatinine, Ser 1.26 (*)    Calcium 8.8 (*)    GFR calc non Af Amer 36 (*)    GFR calc Af Amer 42 (*)    All other components within normal limits  CBC - Abnormal; Notable for the following:    RBC 3.47 (*)    Hemoglobin 9.9 (*)    HCT 31.1 (*)    All other components within normal limits  POC OCCULT BLOOD, ED  TYPE AND SCREEN  ABO/RH    Procedures Procedures (including critical care time)  Medications Ordered in ED Medications - No data to  display   Initial Impression / Assessment and Plan / ED Course  I have reviewed the triage vital signs and the nursing notes.  Pertinent labs & imaging results that were available during my care of the patient were reviewed by me and considered in my medical decision making (see chart for details).  Clinical Course    Patient seen and examined. D/w and seen by Dr. Alvino Chapel. Will give general surgery referral and discharged home. Encouraged continued conservative measures.  Vital signs reviewed and are as follows: BP 160/58   Pulse 84   Temp 98.2 F (36.8 C)   Resp 18   SpO2 100%   Return with uncontrolled pain, rectal bleeding, new symptoms, or other concerns.   Final Clinical Impressions(s) / ED Diagnoses   Final diagnoses:  Rectal prolapse   Patient with intermittent rectal prolapse. There is no acute indications for admission/surgery. Encouraged outpatient follow-up as this is the safest way to have this problem safely addressed. Advise continued conservative measures.  New Prescriptions New Prescriptions   No medications on file     Carlisle Cater, PA-C 07/01/16 [place], MD 07/01/16 2322

## 2016-07-01 NOTE — Discharge Instructions (Signed)
Please read and follow all provided instructions.  Your diagnoses today include:  1. Rectal prolapse     Tests performed today include:  Vital signs. See below for your results today.   Medications prescribed:   None  Home care instructions:  Follow any educational materials contained in this packet.  Make sure the stools are soft and pass easily. If constipation occurs, consider over-the-counter laxatives.   Follow-up instructions: Please follow-up with the surgery referral listed for further evaluation of your symptoms.  Return instructions:   Please return to the Emergency Department if you experience worsening symptoms.   Please return if you have any other emergent concerns.  Additional Information:  Your vital signs today were: BP 160/58    Pulse 84    Temp 98.2 F (36.8 C)    Resp 18    SpO2 100%  If your blood pressure (BP) was elevated above 135/85 this visit, please have this repeated by your doctor within one month. ---------------

## 2016-07-07 ENCOUNTER — Other Ambulatory Visit: Payer: Self-pay | Admitting: Internal Medicine

## 2016-07-07 DIAGNOSIS — S32048S Other fracture of fourth lumbar vertebra, sequela: Secondary | ICD-10-CM

## 2016-07-08 ENCOUNTER — Other Ambulatory Visit: Payer: Self-pay | Admitting: *Deleted

## 2016-07-08 DIAGNOSIS — S32048S Other fracture of fourth lumbar vertebra, sequela: Secondary | ICD-10-CM

## 2016-07-08 MED ORDER — OXYCODONE-ACETAMINOPHEN 5-325 MG PO TABS: 1.0000 | ORAL_TABLET | Freq: Four times a day (QID) | ORAL | 0 refills | Status: DC | PRN

## 2016-07-08 NOTE — Telephone Encounter (Signed)
Patient requested and will pick up 

## 2016-07-09 ENCOUNTER — Other Ambulatory Visit: Payer: Self-pay

## 2016-07-09 MED ORDER — DULOXETINE HCL 60 MG PO CPEP: ORAL_CAPSULE | ORAL | 5 refills | Status: DC

## 2016-07-09 MED ORDER — HYDROXYCHLOROQUINE SULFATE 200 MG PO TABS: ORAL_TABLET | ORAL | 5 refills | Status: DC

## 2016-07-20 ENCOUNTER — Ambulatory Visit: Payer: Self-pay | Admitting: Surgery

## 2016-07-20 ENCOUNTER — Encounter: Payer: Self-pay | Admitting: Surgery

## 2016-07-20 DIAGNOSIS — K5909 Other constipation: Secondary | ICD-10-CM | POA: Diagnosis not present

## 2016-07-20 DIAGNOSIS — Z9889 Other specified postprocedural states: Secondary | ICD-10-CM | POA: Diagnosis not present

## 2016-07-20 DIAGNOSIS — K623 Rectal prolapse: Secondary | ICD-10-CM | POA: Diagnosis not present

## 2016-08-10 ENCOUNTER — Other Ambulatory Visit: Payer: Self-pay | Admitting: Internal Medicine

## 2016-08-10 DIAGNOSIS — S32048S Other fracture of fourth lumbar vertebra, sequela: Secondary | ICD-10-CM

## 2016-08-10 MED ORDER — OXYCODONE-ACETAMINOPHEN 5-325 MG PO TABS: 1.0000 | ORAL_TABLET | Freq: Four times a day (QID) | ORAL | 0 refills | Status: DC | PRN

## 2016-08-10 NOTE — Telephone Encounter (Signed)
A refill request was submitted via interface for oxycodone/apap 5-325 mg tablets. # 120 with no RF. Prescription was printed and placed in Dr. Cyndi Lennert folder for signing. Patient will be called once Rx has been signed and is ready to pick up .

## 2016-08-19 ENCOUNTER — Other Ambulatory Visit: Payer: Self-pay | Admitting: Nurse Practitioner

## 2016-09-07 NOTE — Telephone Encounter (Signed)
Error

## 2016-09-08 ENCOUNTER — Other Ambulatory Visit: Payer: Self-pay | Admitting: *Deleted

## 2016-09-08 DIAGNOSIS — R600 Localized edema: Secondary | ICD-10-CM

## 2016-09-08 MED ORDER — FUROSEMIDE 20 MG PO TABS: ORAL_TABLET | ORAL | 1 refills | Status: DC

## 2016-09-08 NOTE — Telephone Encounter (Signed)
Patient daughter requested.  

## 2016-09-09 ENCOUNTER — Other Ambulatory Visit: Payer: Self-pay | Admitting: *Deleted

## 2016-09-09 DIAGNOSIS — S32048S Other fracture of fourth lumbar vertebra, sequela: Secondary | ICD-10-CM

## 2016-09-09 MED ORDER — OXYCODONE-ACETAMINOPHEN 5-325 MG PO TABS: 1.0000 | ORAL_TABLET | Freq: Four times a day (QID) | ORAL | 0 refills | Status: DC | PRN

## 2016-09-09 NOTE — Telephone Encounter (Signed)
Patient's daughter called to get the status of Oxycodone RX. Patty Walters was informed rx printed and we will call her once rx is signed and available for pick-up.

## 2016-09-09 NOTE — Telephone Encounter (Signed)
[name], daughter requested and will pick up. Note added to envelope that patient needs follow up appointment scheduled.

## 2016-09-28 ENCOUNTER — Telehealth: Payer: Self-pay

## 2016-09-28 ENCOUNTER — Ambulatory Visit: Payer: PPO | Admitting: Nurse Practitioner

## 2016-09-28 ENCOUNTER — Encounter: Payer: Self-pay | Admitting: Nurse Practitioner

## 2016-09-28 NOTE — Telephone Encounter (Signed)
Message left on clinical intake voicemail:   Patient's daughter called to apologize for missed appointment today. [name] tried for an hour to get patient ready for appointment and was unable to get patient down the stairs. Patient is exhausted and c/o hip pain. [name] is not sure if the hip pain is weather related or something else.  [name] would like a recommendation from Ben[facility] Halsted would like to know your opinion of [facility]?  [name] hates that she can not get patient here and really values your time and does not know what to do, Please advise

## 2016-09-29 NOTE — Telephone Encounter (Signed)
I do not know enough about them to give an opinion but if it would be a better option for the pt since they have such a hard time getting her out of the house then they are available.

## 2016-09-30 NOTE — Telephone Encounter (Signed)
I called [name] and left message on voicemail with EUBANKS, [name] K, NP response.  I also left contact information on voicemail: [facility] phone number [phone], I also informed [name] that she can look them up online to complete New Patient registration form.

## 2016-10-12 ENCOUNTER — Other Ambulatory Visit: Payer: Self-pay | Admitting: *Deleted

## 2016-10-12 ENCOUNTER — Ambulatory Visit: Payer: PPO | Admitting: Nurse Practitioner

## 2016-10-12 DIAGNOSIS — R600 Localized edema: Secondary | ICD-10-CM

## 2016-10-12 DIAGNOSIS — S32048S Other fracture of fourth lumbar vertebra, sequela: Secondary | ICD-10-CM

## 2016-10-12 MED ORDER — FUROSEMIDE 20 MG PO TABS: ORAL_TABLET | ORAL | 1 refills | Status: DC

## 2016-10-12 MED ORDER — OXYCODONE-ACETAMINOPHEN 5-325 MG PO TABS: 1.0000 | ORAL_TABLET | Freq: Four times a day (QID) | ORAL | 0 refills | Status: DC | PRN

## 2016-10-12 NOTE — Telephone Encounter (Signed)
Patient daughter, [name] requested and will pick up

## 2016-10-12 NOTE — Telephone Encounter (Signed)
[facility] 

## 2016-10-15 ENCOUNTER — Ambulatory Visit (INDEPENDENT_AMBULATORY_CARE_PROVIDER_SITE_OTHER): Payer: PPO | Admitting: Nurse Practitioner

## 2016-10-15 ENCOUNTER — Encounter: Payer: Self-pay | Admitting: Nurse Practitioner

## 2016-10-15 VITALS — BP 124/78 | HR 64 | Temp 97.6°F | Resp 17 | Ht 64.0 in | Wt 131.0 lb

## 2016-10-15 DIAGNOSIS — R634 Abnormal weight loss: Secondary | ICD-10-CM

## 2016-10-15 DIAGNOSIS — K5900 Constipation, unspecified: Secondary | ICD-10-CM | POA: Diagnosis not present

## 2016-10-15 DIAGNOSIS — R600 Localized edema: Secondary | ICD-10-CM

## 2016-10-15 DIAGNOSIS — C439 Malignant melanoma of skin, unspecified: Secondary | ICD-10-CM | POA: Diagnosis not present

## 2016-10-15 DIAGNOSIS — D649 Anemia, unspecified: Secondary | ICD-10-CM

## 2016-10-15 LAB — CBC WITH DIFFERENTIAL/PLATELET
BASOS PCT: 1 %
Basophils Absolute: 58 cells/uL (ref 0–200)
EOS ABS: 116 {cells}/uL (ref 15–500)
Eosinophils Relative: 2 %
HEMATOCRIT: 30 % — AB (ref 35.0–45.0)
HEMOGLOBIN: 9.9 g/dL — AB (ref 11.7–15.5)
LYMPHS ABS: 1160 {cells}/uL (ref 850–3900)
Lymphocytes Relative: 20 %
MCH: 28.2 pg (ref 27.0–33.0)
MCHC: 33 g/dL (ref 32.0–36.0)
MCV: 85.5 fL (ref 80.0–100.0)
MONO ABS: 406 {cells}/uL (ref 200–950)
MPV: 8.4 fL (ref 7.5–12.5)
Monocytes Relative: 7 %
NEUTROS ABS: 4060 {cells}/uL (ref 1500–7800)
Neutrophils Relative %: 70 %
Platelets: 279 10*3/uL (ref 140–400)
RBC: 3.51 MIL/uL — ABNORMAL LOW (ref 3.80–5.10)
RDW: 15 % (ref 11.0–15.0)
WBC: 5.8 10*3/uL (ref 3.8–10.8)

## 2016-10-15 NOTE — Progress Notes (Signed)
Careteam: Patient Care Team: [name], NP as PCP - General (Nurse Practitioner) Barrie Folk, MD (Inactive) as Referring Physician (Dermatology) Ladell Pier, MD as Consulting Physician (Oncology) Barrie Folk, MD (Inactive) as Referring Physician (Dermatology) Berle Mull, MD as Referring Physician (Oncology) Michael Boston, MD as Consulting Physician (General Surgery)  Advanced Directive information Does Patient Have a Medical Advance Directive?: Yes, Type of Advance Directive: [place];Out of facility DNR (pink MOST or yellow form), Pre-existing out of facility DNR order (yellow form or pink MOST form): Yellow form placed in chart (order not valid for inpatient use)  No Known Allergies  Chief Complaint  Patient presents with  . Medical Management of Chronic Issues    Pt is being seen for a routine follow up.      HPI: Patient is a 83 y.o. female seen in the office today for routine follow up. Pt went to hospital on 07/01/16 due to rectal prolapse.  Went to GI doctor to follow up and the recommended surgery but due to age and co-morbidities this is not an option.  Constipation- managed with colace, fruit, and milk of mag. Working well. Sometime milk of mag is too much.   Completed treatment for melanoma. Has new mole on back.   Does not do much during the day.   Eating well. Much better since last visit  Pain worse when weather is bad. Overall doing pretty good. Some days worse then others and she has a hard time getting down stairs. Bedroom upstairs. This is why she missed last appt.   Review of Systems:  Review of Systems  Constitutional: Negative for chills and fever.  HENT: Positive for hearing loss. Negative for congestion.   Eyes: Negative for pain.  Respiratory: Negative for cough, shortness of breath and wheezing.   Cardiovascular: Negative for chest pain, palpitations and leg swelling.  Gastrointestinal:  Positive for constipation. Negative for abdominal pain and blood in stool.  Genitourinary: Negative for dysuria.  Musculoskeletal: Positive for arthralgias, back pain and gait problem.  Skin: Positive for color change. Negative for wound.       Mole to back.   Neurological: Positive for weakness. Negative for dizziness and headaches.  Psychiatric/Behavioral: Negative for dysphoric mood.    Past Medical History:  Diagnosis Date  . Allergy   . Anemia   . Blood transfusion without reported diagnosis   . Cancer ([place])   . Cataract   . Melanoma ([place])   . Rheumatoid arthritis(714.0)   . UTI (urinary tract infection)    Past Surgical History:  Procedure Laterality Date  . APPENDECTOMY    . BREAST SURGERY Left 2010   ? breast cancer  . FRACTURE SURGERY    . hip replacement Bilateral   . JOINT REPLACEMENT    . MASTECTOMY Left    Social History:   has an unknown smoking status. She has never used smokeless tobacco. She reports that she does not drink alcohol or use drugs.  Family History  Problem Relation Age of Onset  . Emphysema Father     smoked and was exposed to mustard gas  . COPD Father   . Cancer Brother   . Cancer Sister   . Cancer Sister     esophageal  . Hypertension Daughter   . Hypertension Son     Medications: Patient's Medications  New Prescriptions   No medications on file  Previous Medications   ACETAMINOPHEN (TYLENOL) 500 MG TABLET  Take 500-1,000 mg by mouth every 6 (six) hours as needed for mild pain.   BARRIER CREAM (NON-SPECIFIED) CREA    Apply 1 application topically 2 (two) times daily as needed (To prevent bedsores.).    DULOXETINE (CYMBALTA) 60 MG CAPSULE    TAKE 1 CAPSULE (60 MG TOTAL) BY MOUTH DAILY.   FUROSEMIDE (LASIX) 20 MG TABLET    TAKE 1 TABLET BY MOUTH EVERY DAY AS NEEDED FOR EDEMA   HYDROXYCHLOROQUINE (PLAQUENIL) 200 MG TABLET    TAKE 1 TABLET (200 MG TOTAL) BY MOUTH DAILY.   MAGNESIUM HYDROXIDE (MILK OF MAGNESIA) 800 MG/5ML  SUSPENSION    Take 15 mLs by mouth daily.    MULTIPLE VITAMIN (MULTIVITAMIN) TABLET    Take 1 tablet by mouth daily.   NEOMYCIN-BACITRACIN-POLYMYXIN (NEOSPORIN) [phone] OINTMENT    Apply topically 3 (three) times daily. Right shin abrasion   NYSTATIN (MYCOSTATIN/NYSTOP) 100000 UNIT/GM POWD    Twice daily as needed to affected area   OVER THE COUNTER MEDICATION    Place 2 drops into both eyes as needed (For eye irritation.). [facility] Allergy Relief Eye Drops    OXYCODONE-ACETAMINOPHEN (PERCOCET) 5-325 MG TABLET    Take 1-2 tablets by mouth every 6 (six) hours as needed for severe pain.  Modified Medications   No medications on file  Discontinued Medications   CEPHALEXIN (KEFLEX) 500 MG CAPSULE    Take 1 capsule (500 mg total) by mouth 4 (four) times daily.   LIDOCAINE-PRILOCAINE (EMLA) CREAM    Apply 1 application topically as needed (Apply to lesions undergoing injection 30 mins prior to treatment.).    PROCHLORPERAZINE (COMPAZINE) 10 MG TABLET    Take 10 mg by mouth every 6 (six) hours as needed for nausea or vomiting.     Physical Exam:  Vitals:   10/15/16 1502  BP: 124/78  Pulse: 64  Resp: 17  Temp: 97.6 F (36.4 C)  TempSrc: Oral  SpO2: 96%  Weight: 131 lb (59.4 kg)  Height: '5\' 4"'$  (1.626 m)   Body mass index is 22.49 kg/m.  Physical Exam  Constitutional: She is oriented to person, place, and time. She appears well-developed. No distress.  Frail elderly female in NAD  HENT:  Head: Normocephalic and atraumatic.  Mouth/Throat: Oropharynx is clear and moist. No oropharyngeal exudate.  Eyes: Conjunctivae are normal. Pupils are equal, round, and reactive to light.  Neck: Normal range of motion. Neck supple.  Cardiovascular: Normal rate, regular rhythm and normal heart sounds.   Occasional irregular beat  Pulmonary/Chest: Effort normal and breath sounds normal. No respiratory distress.  Abdominal: Soft. Bowel sounds are normal.  Musculoskeletal: She exhibits edema (2+  bilaterally).  Neurological: She is alert and oriented to person, place, and time.  Skin: Skin is warm and dry. She is not diaphoretic.  Discoloration to forehead from skin removal, lesion healed  1.5 x 1.5 cm raised Lesion on back with erythema   Psychiatric: She has a normal mood and affect.   Labs reviewed: Basic Metabolic Panel:  Recent Labs  02/20/16 1021  02/23/16 0416 02/26/16 04/07/16 1520 07/01/16 1755  NA  --   < > 138 140 138 140  K  --   < > 5.3* 5.4* 5.8* 4.0  CL  --   < > 105  --  101 107  CO2  --   < > 27  --  22 25  GLUCOSE  --   < > 97  --  97 122*  BUN  --   < >  27* 24* 22 29*  CREATININE  --   < > 1.20* 1.1 1.14* 1.26*  CALCIUM  --   < > 8.8*  --  9.5 8.8*  TSH 1.511  --   --   --   --   --   < > = values in this interval not displayed. Liver Function Tests:  Recent Labs  12/16/15 1452 04/07/16 1520 07/01/16 1755  AST '16 22 23  '$ ALT '15 15 16  '$ ALKPHOS 70 85 71  BILITOT 0.2 0.4 0.4  PROT 6.6 7.1 7.0  ALBUMIN 3.9 4.0 3.7   No results for input(s): LIPASE, AMYLASE in the last 8760 hours. No results for input(s): AMMONIA in the last 8760 hours. CBC:  Recent Labs  02/10/16 1947 02/20/16 0440  02/23/16 0416 04/07/16 1520 07/01/16 1755  WBC 10.7* 8.7  < > 8.7 7.4 6.1  NEUTROABS 9.0* 6.1  --   --  5,328  --   HGB 11.5* 7.7*  < > 9.4* 10.8* 9.9*  HCT 34.8* 24.4*  < > 30.5* 33.0* 31.1*  MCV 85.9 89.4  < > 90.8 84.4 89.6  PLT 285 400  < > 331 285 334  < > = values in this interval not displayed. Lipid Panel: No results for input(s): CHOL, HDL, LDLCALC, TRIG, CHOLHDL, LDLDIRECT in the last 8760 hours. TSH:  Recent Labs  02/20/16 1021  TSH 1.511   A1C: Lab Results  Component Value Date   HGBA1C 5.6 02/20/2016     Assessment/Plan 1. Constipation, unspecified constipation type Cont colace with fruit and good hydration. May use miralax 17 gm by mouth daily as needed  2. Melanoma of skin ([place]) Has completed treatment at [facility], plans to  follow up with dermatologist in town for skin check. Now with new skin lesion to back. Recommend evaluation and biopsy of this due to hx of melanoma.   3. Chronic anemia -will follow up CBC with Differential/Platelets  4. Localized edema Compression wraps being use and Using lasix as needed (which daughter  - CMP with eGFR  5. Weight loss -weight loss noted after lumbar fx but has been doing better and now without pain. Appetite has improved.  - CMP with eGFR  Follow up in 97month, sooner if needed  Patty Walters K. EHarle Battiest [facility] [phone] am - 5 pm) [phone](after hours)

## 2016-10-15 NOTE — Patient Instructions (Signed)
Would recommend dermatology follow up on skin lesion Will follow up labs today To use miralax 17 gm daily or every other day as needed constipation

## 2016-10-16 LAB — COMPLETE METABOLIC PANEL WITH GFR
ALT: 12 U/L (ref 6–29)
AST: 19 U/L (ref 10–35)
Albumin: 3.7 g/dL (ref 3.6–5.1)
Alkaline Phosphatase: 68 U/L (ref 33–130)
BILIRUBIN TOTAL: 0.3 mg/dL (ref 0.2–1.2)
BUN: 26 mg/dL — AB (ref 7–25)
CALCIUM: 9.3 mg/dL (ref 8.6–10.4)
CO2: 25 mmol/L (ref 20–31)
CREATININE: 1.07 mg/dL — AB (ref 0.60–0.88)
Chloride: 104 mmol/L (ref 98–110)
GFR, EST AFRICAN AMERICAN: 52 mL/min — AB (ref >=60)
GFR, Est Non African American: 45 mL/min — ABNORMAL LOW (ref >=60)
Glucose, Bld: 90 mg/dL (ref 65–99)
Potassium: 5.5 mmol/L — ABNORMAL HIGH (ref 3.5–5.3)
Sodium: 139 mmol/L (ref 135–146)
TOTAL PROTEIN: 6.7 g/dL (ref 6.1–8.1)

## 2016-10-22 DIAGNOSIS — Z85828 Personal history of other malignant neoplasm of skin: Secondary | ICD-10-CM | POA: Diagnosis not present

## 2016-10-22 DIAGNOSIS — L814 Other melanin hyperpigmentation: Secondary | ICD-10-CM | POA: Diagnosis not present

## 2016-10-22 DIAGNOSIS — D485 Neoplasm of uncertain behavior of skin: Secondary | ICD-10-CM | POA: Diagnosis not present

## 2016-11-11 ENCOUNTER — Other Ambulatory Visit: Payer: Self-pay | Admitting: *Deleted

## 2016-11-11 DIAGNOSIS — S32048S Other fracture of fourth lumbar vertebra, sequela: Secondary | ICD-10-CM

## 2016-11-11 MED ORDER — OXYCODONE-ACETAMINOPHEN 5-325 MG PO TABS: 1.0000 | ORAL_TABLET | Freq: Four times a day (QID) | ORAL | 0 refills | Status: DC | PRN

## 2016-11-11 NOTE — Telephone Encounter (Signed)
Patient requested and will pick up 

## 2016-12-10 ENCOUNTER — Other Ambulatory Visit: Payer: Self-pay | Admitting: Internal Medicine

## 2016-12-10 DIAGNOSIS — S32048S Other fracture of fourth lumbar vertebra, sequela: Secondary | ICD-10-CM

## 2016-12-10 MED ORDER — OXYCODONE-ACETAMINOPHEN 5-325 MG PO TABS: 1.0000 | ORAL_TABLET | Freq: Four times a day (QID) | ORAL | 0 refills | Status: DC | PRN

## 2016-12-10 NOTE — Telephone Encounter (Signed)
A refill request was received via web interface for oxycodone/apap 5-325 mg tablets. Rx was printed and placed in Dr. Cyndi Lennert folder for signing. Pt will be called once Rx has been signed and ready for pick up.

## 2016-12-10 NOTE — Telephone Encounter (Signed)
Patient informed rx signed and ready for pickup  

## 2017-01-05 ENCOUNTER — Other Ambulatory Visit: Payer: Self-pay | Admitting: Nurse Practitioner

## 2017-01-05 DIAGNOSIS — R600 Localized edema: Secondary | ICD-10-CM

## 2017-01-11 ENCOUNTER — Other Ambulatory Visit: Payer: Self-pay | Admitting: Nurse Practitioner

## 2017-01-11 ENCOUNTER — Other Ambulatory Visit: Payer: Self-pay | Admitting: Internal Medicine

## 2017-01-11 DIAGNOSIS — S32048S Other fracture of fourth lumbar vertebra, sequela: Secondary | ICD-10-CM

## 2017-01-11 MED ORDER — OXYCODONE-ACETAMINOPHEN 5-325 MG PO TABS
1.0000 | ORAL_TABLET | Freq: Four times a day (QID) | ORAL | 0 refills | Status: DC | PRN
Start: 2017-01-11 — End: 2017-02-10

## 2017-01-11 NOTE — Telephone Encounter (Signed)
Patient requested a refill of Oxycodone/Apap 5-325 mg. Rx was printed and placed in Dr. Cyndi Lennert folder for signing. Patient will be called once Rx is signed.

## 2017-02-10 ENCOUNTER — Other Ambulatory Visit: Payer: Self-pay | Admitting: Internal Medicine

## 2017-02-10 DIAGNOSIS — S32048S Other fracture of fourth lumbar vertebra, sequela: Secondary | ICD-10-CM

## 2017-02-10 MED ORDER — OXYCODONE-ACETAMINOPHEN 5-325 MG PO TABS: 1.0000 | ORAL_TABLET | Freq: Four times a day (QID) | ORAL | 0 refills | Status: DC | PRN

## 2017-02-10 NOTE — Telephone Encounter (Signed)
A refill request was submitted via mychart for oxycodone. Rx printed for #120 with no RF.

## 2017-02-12 ENCOUNTER — Telehealth: Payer: Self-pay | Admitting: *Deleted

## 2017-02-12 DIAGNOSIS — R600 Localized edema: Secondary | ICD-10-CM | POA: Diagnosis not present

## 2017-02-12 DIAGNOSIS — L03032 Cellulitis of left toe: Secondary | ICD-10-CM | POA: Diagnosis not present

## 2017-02-12 NOTE — Telephone Encounter (Signed)
Daughter, [name] called and stated that patient scraped foot and now it looks like on her left foot, middle toe looks infected, Swollen and red. Daughter called [facility] and they could not see her.  Daughter has been using Hydrogen peroxide and keeping it clean and dry but has worsen with redness and swelling.  Advised daughter to take patient to Urgent Care to have evaluated for possible Antibiotic. Daughter agreed.

## 2017-03-08 ENCOUNTER — Other Ambulatory Visit: Payer: Self-pay | Admitting: Nurse Practitioner

## 2017-03-08 DIAGNOSIS — R600 Localized edema: Secondary | ICD-10-CM

## 2017-03-15 ENCOUNTER — Other Ambulatory Visit: Payer: Self-pay | Admitting: Internal Medicine

## 2017-03-15 DIAGNOSIS — S32048S Other fracture of fourth lumbar vertebra, sequela: Secondary | ICD-10-CM

## 2017-03-15 MED ORDER — OXYCODONE-ACETAMINOPHEN 5-325 MG PO TABS: 1.0000 | ORAL_TABLET | Freq: Four times a day (QID) | ORAL | 0 refills | Status: DC | PRN

## 2017-03-15 NOTE — Telephone Encounter (Signed)
Rx request via mychart. Rx was printed for Dr. Mariea Clonts to signed.

## 2017-03-22 ENCOUNTER — Other Ambulatory Visit: Payer: Self-pay | Admitting: Nurse Practitioner

## 2017-04-14 ENCOUNTER — Other Ambulatory Visit: Payer: Self-pay | Admitting: Internal Medicine

## 2017-04-14 DIAGNOSIS — S32048S Other fracture of fourth lumbar vertebra, sequela: Secondary | ICD-10-CM

## 2017-04-15 MED ORDER — OXYCODONE-ACETAMINOPHEN 5-325 MG PO TABS: 1.0000 | ORAL_TABLET | Freq: Four times a day (QID) | ORAL | 0 refills | Status: DC | PRN

## 2017-04-15 NOTE — Telephone Encounter (Signed)
Medication request was received via mychart. Rx was printed and placed in Dr. Cyndi Lennert folder for signing. Patient will be called once Rx is ready for pick up.

## 2017-04-19 ENCOUNTER — Ambulatory Visit
Admission: RE | Admit: 2017-04-19 | Discharge: 2017-04-19 | Disposition: A | Discharge status: Home / Self Care | Payer: PPO | Source: Ambulatory Visit | Attending: Nurse Practitioner | Admitting: Nurse Practitioner

## 2017-04-19 ENCOUNTER — Ambulatory Visit (INDEPENDENT_AMBULATORY_CARE_PROVIDER_SITE_OTHER): Payer: PPO | Admitting: Nurse Practitioner

## 2017-04-19 ENCOUNTER — Encounter: Payer: Self-pay | Admitting: Nurse Practitioner

## 2017-04-19 VITALS — BP 112/68 | HR 63 | Temp 98.6°F | Resp 17 | Ht 64.0 in | Wt 131.2 lb

## 2017-04-19 DIAGNOSIS — R600 Localized edema: Secondary | ICD-10-CM | POA: Diagnosis not present

## 2017-04-19 DIAGNOSIS — D638 Anemia in other chronic diseases classified elsewhere: Secondary | ICD-10-CM | POA: Diagnosis not present

## 2017-04-19 DIAGNOSIS — R634 Abnormal weight loss: Secondary | ICD-10-CM

## 2017-04-19 DIAGNOSIS — M79675 Pain in left toe(s): Secondary | ICD-10-CM

## 2017-04-19 DIAGNOSIS — Z23 Encounter for immunization: Secondary | ICD-10-CM | POA: Diagnosis not present

## 2017-04-19 DIAGNOSIS — E2839 Other primary ovarian failure: Secondary | ICD-10-CM

## 2017-04-19 DIAGNOSIS — C439 Malignant melanoma of skin, unspecified: Secondary | ICD-10-CM

## 2017-04-19 DIAGNOSIS — M19072 Primary osteoarthritis, left ankle and foot: Secondary | ICD-10-CM | POA: Diagnosis not present

## 2017-04-19 LAB — CBC WITH DIFFERENTIAL/PLATELET
Basophils Absolute: 51 cells/uL (ref 0–200)
Basophils Relative: 0.8 %
Eosinophils Absolute: 160 cells/uL (ref 15–500)
Eosinophils Relative: 2.5 %
HEMATOCRIT: 29.6 % — AB (ref 35.0–45.0)
Hemoglobin: 9.8 g/dL — ABNORMAL LOW (ref 11.7–15.5)
LYMPHS ABS: 986 {cells}/uL (ref 850–3900)
MCH: 28.2 pg (ref 27.0–33.0)
MCHC: 33.1 g/dL (ref 32.0–36.0)
MCV: 85.1 fL (ref 80.0–100.0)
MPV: 9 fL (ref 7.5–12.5)
Monocytes Relative: 6.8 %
Neutro Abs: 4768 cells/uL (ref 1500–7800)
Neutrophils Relative %: 74.5 %
Platelets: 285 10*3/uL (ref 140–400)
RBC: 3.48 10*6/uL — AB (ref 3.80–5.10)
RDW: 13.7 % (ref 11.0–15.0)
Total Lymphocyte: 15.4 %
WBC: 6.4 10*3/uL (ref 3.8–10.8)
WBCMIX: 435 {cells}/uL (ref 200–950)

## 2017-04-19 NOTE — Progress Notes (Signed)
Careteam: Patient Care Team: [name], NP as PCP - General (Nurse Practitioner) Barrie Folk, MD (Inactive) as Referring Physician (Dermatology) Ladell Pier, MD as Consulting Physician (Oncology) Barrie Folk, MD (Inactive) as Referring Physician (Dermatology) Berle Mull, MD as Referring Physician (Oncology) Michael Boston, MD as Consulting Physician (General Surgery)  Advanced Directive information Does Patient Have a Medical Advance Directive?: Yes, Type of Advance Directive: [place];Living will;Out of facility DNR (pink MOST or yellow form), Pre-existing out of facility DNR order (yellow form or pink MOST form): Pink MOST form placed in chart (order not valid for inpatient use)  No Known Allergies  Chief Complaint  Patient presents with  . Medical Management of Chronic Issues    Pt is being seen for a 6 month routine visit. Pt c/o possible infection in left middle toe; scabbed spot on back; mole on right side of forehead.      HPI: Patient is a 83 y.o. female seen in the office today for routine follow up.  Pt with hx of melanoma following with [facility] dermatology, OA, constipation with rectal prolapse, edema and others.  Left 3rd toe has been really red and painful for about a month, swollen and tender. Overgrowth and thick toe nail but wont let daughter trim toe nails.  Used to go to podiatrist but now medicare not covering.  She has callus with dark center that they were trimming- this has been there for all long as daughter remembers- this has not changed.  Walks barefoot at home. No injury No fevers.   Rheumatoid arthritis- taking Cymbalta and plaquenil for this. Does not follow with rheumatologist  Swelling in legs- cont to use lasix- was daily but now more rare  Loss of weight- eats well for what her daughter gives her- no supplement.   Several keratosis- dermatology following.   Review of Systems:    Review of Systems  Constitutional: Negative for chills and fever.  HENT: Positive for hearing loss. Negative for congestion.   Eyes: Negative for pain.  Respiratory: Negative for cough, shortness of breath and wheezing.   Cardiovascular: Positive for leg swelling. Negative for chest pain, palpitations, orthopnea and PND.  Gastrointestinal: Negative for abdominal pain, blood in stool, constipation and melena.  Genitourinary: Negative for dysuria.  Musculoskeletal: Negative for falls.       Painful, red toe.  Skin:       Scarring on forehead from melanoma. Small lesion to left temple   Neurological: Positive for weakness. Negative for dizziness, loss of consciousness and headaches.  Endo/Heme/Allergies: Bruises/bleeds easily.  Psychiatric/Behavioral: Negative for depression and memory loss.    Past Medical History:  Diagnosis Date  . Allergy   . Anemia   . Blood transfusion without reported diagnosis   . Cancer ([place])   . Cataract   . Melanoma ([place])   . Rheumatoid arthritis(714.0)   . UTI (urinary tract infection)    Past Surgical History:  Procedure Laterality Date  . APPENDECTOMY    . BREAST SURGERY Left 2010   ? breast cancer  . FRACTURE SURGERY    . hip replacement Bilateral   . JOINT REPLACEMENT    . MASTECTOMY Left    Social History:   reports that she has never smoked. She has never used smokeless tobacco. She reports that she does not drink alcohol or use drugs.  Family History  Problem Relation Age of Onset  . Emphysema Father  smoked and was exposed to mustard gas  . COPD Father   . Cancer Brother   . Cancer Sister   . Cancer Sister        esophageal  . Hypertension Daughter   . Hypertension Son     Medications: Patient's Medications  New Prescriptions   No medications on file  Previous Medications   ACETAMINOPHEN (TYLENOL) 500 MG TABLET    Take 500-1,000 mg by mouth every 6 (six) hours as needed for mild pain.   BARRIER CREAM (NON-SPECIFIED)  CREA    Apply 1 application topically 2 (two) times daily as needed (To prevent bedsores.).    DULOXETINE (CYMBALTA) 60 MG CAPSULE    TAKE 1 CAPSULE (60 MG TOTAL) BY MOUTH DAILY.   FUROSEMIDE (LASIX) 20 MG TABLET    TAKE 1 TABLET BY MOUTH EVERY DAY AS NEEDED FOR EDEMA   HYDROXYCHLOROQUINE (PLAQUENIL) 200 MG TABLET    TAKE 1 TABLET (200 MG TOTAL) BY MOUTH DAILY.   MAGNESIUM HYDROXIDE (MILK OF MAGNESIA) 800 MG/5ML SUSPENSION    Take 15 mLs by mouth daily.    MULTIPLE VITAMIN (MULTIVITAMIN) TABLET    Take 1 tablet by mouth daily.   NEOMYCIN-BACITRACIN-POLYMYXIN (NEOSPORIN) [phone] OINTMENT    Apply topically 3 (three) times daily. Right shin abrasion   NYSTATIN (MYCOSTATIN/NYSTOP) 100000 UNIT/GM POWD    Twice daily as needed to affected area   OVER THE COUNTER MEDICATION    Place 2 drops into both eyes as needed (For eye irritation.). [facility] Allergy Relief Eye Drops    OXYCODONE-ACETAMINOPHEN (PERCOCET) 5-325 MG TABLET    Take 1-2 tablets by mouth every 6 (six) hours as needed for severe pain.  Modified Medications   No medications on file  Discontinued Medications   No medications on file     Physical Exam:  Vitals:   04/19/17 1457  BP: 112/68  Pulse: 63  Resp: 17  Temp: 98.6 F (37 C)  TempSrc: Oral  SpO2: 97%  Weight: 131 lb 3.2 oz (59.5 kg)  Height: '5\' 4"'$  (1.626 m)   Body mass index is 22.52 kg/m.  Physical Exam  Constitutional: She is oriented to person, place, and time. She appears well-developed. No distress.  Frail elderly female in NAD  HENT:  Head: Normocephalic and atraumatic.  Mouth/Throat: Oropharynx is clear and moist. No oropharyngeal exudate.  Eyes: Pupils are equal, round, and reactive to light. Conjunctivae are normal.  Neck: Normal range of motion. Neck supple.  Cardiovascular: Normal rate, regular rhythm, normal heart sounds and intact distal pulses.   Occasional irregular beat  Pulmonary/Chest: Effort normal and breath sounds normal. No respiratory  distress.  Abdominal: Soft. Bowel sounds are normal.  Musculoskeletal: She exhibits edema (trace) and tenderness (and redness to 3rd toe on left foot).  Neurological: She is alert and oriented to person, place, and time.  Skin: Skin is warm and dry. She is not diaphoretic.  Discoloration to forehead from skin removal, lesion healed  One small red area to left temporal region   Psychiatric: She has a normal mood and affect.    Labs reviewed: Basic Metabolic Panel:  Recent Labs  07/01/16 1755 10/15/16 1540  NA 140 139  K 4.0 5.5*  CL 107 104  CO2 25 25  GLUCOSE 122* 90  BUN 29* 26*  CREATININE 1.26* 1.07*  CALCIUM 8.8* 9.3   Liver Function Tests:  Recent Labs  07/01/16 1755 10/15/16 1540  AST 23 19  ALT 16 12  ALKPHOS  71 68  BILITOT 0.4 0.3  PROT 7.0 6.7  ALBUMIN 3.7 3.7   No results for input(s): LIPASE, AMYLASE in the last 8760 hours. No results for input(s): AMMONIA in the last 8760 hours. CBC:  Recent Labs  07/01/16 1755 10/15/16 1540  WBC 6.1 5.8  NEUTROABS  --  4,060  HGB 9.9* 9.9*  HCT 31.1* 30.0*  MCV 89.6 85.5  PLT 334 279   Lipid Panel: No results for input(s): CHOL, HDL, LDLCALC, TRIG, CHOLHDL, LDLDIRECT in the last 8760 hours. TSH: No results for input(s): TSH in the last 8760 hours. A1C: Lab Results  Component Value Date   HGBA1C 5.6 02/20/2016     Assessment/Plan 1. Toe pain, left -gout/OA vs infective.  Will get - CBC with Differential/Platelets to follow up white count - DG Foot Complete Left; Future  - Uric acid  2. Melanoma of skin ([place]) -original lesion has been healed however now with another area over scar. encouraged to follow up with dermatology for evaluation and pathology   3. Weight loss -encouraged proper nutrition, to use ensure daily after smallest meal of the day for supplement.  - TSH  4. Anemia of chronic disease -hgb 9.9 on last labs, will follow up today  5. Bilateral edema of lower extremity -stable  encouraged to use lasix minimally and elevate legs, compression hose and decrease sodium intake.  - CMP with eGFR  6. Estrogen deficiency - DG Bone Density; Future  7. Need for immunization against influenza - Flu Vaccine QUAD 6+ mos PF IM (Fluarix Quad PF)  Next appt: 6 months  Kaliegh Willadsen K. Harle Battiest  [facility] [phone] am - 5 pm) [phone] (after hours)

## 2017-04-19 NOTE — Patient Instructions (Signed)
To go get xray once you leave here at  [facility]  Will get lab work today To follow up with dermatology on skin lesions

## 2017-04-20 ENCOUNTER — Telehealth: Payer: Self-pay

## 2017-04-20 ENCOUNTER — Other Ambulatory Visit: Payer: Self-pay | Admitting: Nurse Practitioner

## 2017-04-20 LAB — COMPLETE METABOLIC PANEL WITH GFR
AG RATIO: 1.3 (calc) (ref 1.0–2.5)
ALT: 12 U/L (ref 6–29)
AST: 20 U/L (ref 10–35)
Albumin: 3.7 g/dL (ref 3.6–5.1)
Alkaline phosphatase (APISO): 67 U/L (ref 33–130)
BUN/Creatinine Ratio: 23 (calc) — ABNORMAL HIGH (ref 6–22)
BUN: 26 mg/dL — ABNORMAL HIGH (ref 7–25)
CALCIUM: 9 mg/dL (ref 8.6–10.4)
CO2: 29 mmol/L (ref 20–32)
Chloride: 105 mmol/L (ref 98–110)
Creat: 1.14 mg/dL — ABNORMAL HIGH (ref 0.60–0.88)
GFR, EST AFRICAN AMERICAN: 48 mL/min/{1.73_m2} — AB (ref >=60)
GFR, EST NON AFRICAN AMERICAN: 41 mL/min/{1.73_m2} — AB (ref >=60)
GLOBULIN: 2.8 g/dL (ref 1.9–3.7)
Glucose, Bld: 108 mg/dL (ref 65–139)
POTASSIUM: 5 mmol/L (ref 3.5–5.3)
Sodium: 141 mmol/L (ref 135–146)
TOTAL PROTEIN: 6.5 g/dL (ref 6.1–8.1)
Total Bilirubin: 0.3 mg/dL (ref 0.2–1.2)

## 2017-04-20 LAB — URIC ACID: Uric Acid, Serum: 7.3 mg/dL — ABNORMAL HIGH (ref 2.5–7.0)

## 2017-04-20 LAB — TSH: TSH: 1.8 mIU/L (ref 0.40–4.50)

## 2017-04-20 MED ORDER — PREDNISONE 10 MG (21) PO TBPK: ORAL_TABLET | ORAL | 0 refills | Status: DC

## 2017-04-20 NOTE — Telephone Encounter (Signed)
I called [name], Per [name]'s request please call with status update once prednisone is completed.  [name] will call once patient completes prednisone with a status update, preventative medication to be called in at that time.

## 2017-04-20 NOTE — Telephone Encounter (Deleted)
-----   Message from [name], NP sent at 04/20/2017 10:12 AM EDT ----- When you get a chance, will you call daughter back and have her call after completion of prednisone and let us know how the toe is, I generally would bring pt back in but it is hard for them to get out of the house.  Also, is she seeing a rheumatolgist still for her rheumatoid arthritis- I asked them yesterday but forgot to write it in the note so of course I have forgotten- :-\  Thanks

## 2017-05-02 ENCOUNTER — Other Ambulatory Visit: Payer: Self-pay | Admitting: Internal Medicine

## 2017-05-02 DIAGNOSIS — L03119 Cellulitis of unspecified part of limb: Secondary | ICD-10-CM

## 2017-05-02 MED ORDER — DOXYCYCLINE HYCLATE 100 MG PO TABS: 100.0000 mg | ORAL_TABLET | Freq: Two times a day (BID) | ORAL | 0 refills | Status: DC

## 2017-05-02 NOTE — Progress Notes (Signed)
Received call from pt's daughter, [name], on Sunday 9/30 re: her mother's toes and left foot being very erythematous, warm, and more swollen.  Pic was sent to me today.  Pt was seen by NP [place] on 9/18 and it was not entirely clear what was going on at that time--possible fracture of toe vs gout vs infection.  She had xrays performed which were negative for fracture, uric acid was only 7.3.  She was treated with prednisone which she completed midweek last week.  She's gotten more and more pain and swelling and is now having difficulty bearing weight on the left foot. Pt's other daughter who is an NP came to visit and felt the foot looked infected at this point.  Due to inability to get in promptly with podiatry and increased difficulty walking, her daughter called me to request antibiotics.  I explained that we normally do not call in abx on weekends without seeing the pt, but since she was recently seen by [name] and has the same problem, I faxed  in doxycycline 100mg  po bid for 10 days.  I also remember this frail pt who is difficulty to transport for appts.  Her daughter is soaking the foot in betadine which is ok to continue.  Sounds like this started with a callus on a toe.  I also suggested elevating the leg (already doing), pushing fluids, and eating yogurt while on the abx.  She is to call back if she does not see improvement after 2 days of treatment.  I also advised that if pt develops fever, the cellulitis is worsening and she absolutely cannot bear weight over the next couple of days, she should take her to the ED.   She expressed understanding and said she will call back in 2 days if there is no improvement notable.  Crespin Forstrom L. Elmer Merwin, D.O. [facility] [address], [place] [address] Cell Phone (Mon-Fri 8am-5pm):  [phone] On Call:  [phone] & follow prompts after 5pm & weekends Office Phone:  [phone] Office Fax:   [phone]

## 2017-05-17 ENCOUNTER — Other Ambulatory Visit: Payer: Self-pay | Admitting: Internal Medicine

## 2017-05-17 DIAGNOSIS — S32048S Other fracture of fourth lumbar vertebra, sequela: Secondary | ICD-10-CM

## 2017-05-17 NOTE — Telephone Encounter (Signed)
Rx Handwritten. Dr. Mariea Clonts signed NCCSRS Database not verified due to no power

## 2017-06-10 ENCOUNTER — Encounter: Payer: Self-pay | Admitting: Podiatry

## 2017-06-10 ENCOUNTER — Ambulatory Visit (INDEPENDENT_AMBULATORY_CARE_PROVIDER_SITE_OTHER): Payer: PPO

## 2017-06-10 ENCOUNTER — Other Ambulatory Visit: Payer: Self-pay | Admitting: Podiatry

## 2017-06-10 ENCOUNTER — Ambulatory Visit: Payer: PPO | Admitting: Podiatry

## 2017-06-10 DIAGNOSIS — M05772 Rheumatoid arthritis with rheumatoid factor of left ankle and foot without organ or systems involvement: Secondary | ICD-10-CM

## 2017-06-10 DIAGNOSIS — G629 Polyneuropathy, unspecified: Secondary | ICD-10-CM | POA: Diagnosis not present

## 2017-06-10 DIAGNOSIS — B351 Tinea unguium: Secondary | ICD-10-CM

## 2017-06-10 DIAGNOSIS — M069 Rheumatoid arthritis, unspecified: Secondary | ICD-10-CM

## 2017-06-10 DIAGNOSIS — M79609 Pain in unspecified limb: Secondary | ICD-10-CM

## 2017-06-10 DIAGNOSIS — R52 Pain, unspecified: Secondary | ICD-10-CM

## 2017-06-10 DIAGNOSIS — M79676 Pain in unspecified toe(s): Secondary | ICD-10-CM | POA: Diagnosis not present

## 2017-06-10 MED ORDER — METHYLPREDNISOLONE 4 MG PO TBPK: ORAL_TABLET | ORAL | 0 refills | Status: DC

## 2017-06-10 NOTE — Progress Notes (Signed)
This patient presents to the office for preventative foot carn noted at the tips of the fourth toe left and the third toe, right  due to clavi.  This patient's daughter says that she thought that her mother could not be seen by myself due to Medicare guidelines.  Once she discovered that she could be seen by myself. she then made an appointment to be seen in my office today.  The daughter was very concerned about the redness and swelling noted in both feet and toes and  especially the third toe left foot.  In July she called to [facility] office and was given the advice to bring her mother to the urgent care.  This patient has a history of rheumatoid arthritis for which she takes Cymbalta and plaquenal. She was brought to the urgent care, was treated with antibiotics and her toe  Improved.  She says that her mother's third toe again became red and swollen and inflamed. She then was seen by Sherrie Mustache who ordered xrays and lab work.  This patient was also prescribed cortisone by mouth.  No NSAIDS were prescribed due to kidney disease.  The xray report revealed no bony pathology.  Her bloodwork  revealed a small elevation in her uric acid.   The bloodwork also revealed her WBC to be in normal range.  Her daughter says that despite all this medical treatment there has been no improvement in her mothers feet.   She presents to the office for preventative foot care services and an evaluation of her red swollen feet.  General Appearance  Alert, conversant and in no acute stress.  Vascular  Dorsalis pedis and posterior pulses are not  palpable  bilaterally due to her severe swelling.  Capillary return is within normal limits  Bilaterally.  Cold feet noted.  Neurologic  Senn-Weinstein monofilament wire test within normal limits  bilaterally. Muscle power  Within normal limits bilaterally.  Nails Thick disfigured discolored nails with subungual debride bilaterally from hallux to fifth toes bilaterally. No  evidence of bacterial infection or drainage bilaterally.  Orthopedic  No limitations of motion of motion feet bilaterally.  No crepitus or effusions noted.  Contracture and swelling noted to all digits 2-5  B/L.  Skin  normotropic skin with no porokeratosis noted bilaterally.  There is significant redness and swelling noted over the MPJ of both feet. There is no evidence of any streaking noted up her leg.  Her third toe left foot reveals an enlargement of the third toe relative to the other digits.  There is a healing clavi third toe left foot.   No evidence of an open lesion or drainage.     Inflammation both feet especially third toe left foot.  Inflammation due to RA.    ROV  Debridement of nails  B/L.  X-rays were taken of her left foot.  There appears to be osteolyses noted at the distal bones of the second, third, fourth and fifth digits, left foot.  There appears to be a fracture to the phalanx the third toe, left foot.  I asked the daughter how she responded to the previous prescription of cortisone by mouth.  She  said her mother his foot markedly improved.  I do not feel she has a cellulitis to  her feet due to my clinical findings and the normal findings of the right blood cells in her bloodwork.  The x-rays give the appearance osteolysis of the distal bones in her digits.  There is dramatic cloudiness noted over the distal bones in the digits of the left foot.  This makes me feel she is having an arthritic response leading to. Her feet inflammation  . I prescribed a Medrol Dosepak for her to take by mouth.  I also ordered an arthritic profile to be done .  She is to return to the office in 5 days for further consultation and treatment of her feet.  Patient is to call the office if any problems occur. I did discuss the need with the daughter to take an MRI but this patient is 38.     Gardiner Barefoot DPM

## 2017-06-10 NOTE — Telephone Encounter (Signed)
Done

## 2017-06-11 LAB — ARTHRITIS PANEL
ANA: POSITIVE — AB
Rhuematoid fact SerPl-aCnc: 20.1 IU/mL — ABNORMAL HIGH (ref 0.0–13.9)
SED RATE: 31 mm/h (ref 0–40)
URIC ACID: 7.3 mg/dL — AB (ref 2.5–7.1)

## 2017-06-16 ENCOUNTER — Ambulatory Visit: Payer: PPO | Admitting: Podiatry

## 2017-06-16 ENCOUNTER — Other Ambulatory Visit: Payer: Self-pay | Admitting: Nurse Practitioner

## 2017-06-16 DIAGNOSIS — S32048S Other fracture of fourth lumbar vertebra, sequela: Secondary | ICD-10-CM

## 2017-06-17 ENCOUNTER — Other Ambulatory Visit: Payer: Self-pay | Admitting: *Deleted

## 2017-06-17 DIAGNOSIS — S32048S Other fracture of fourth lumbar vertebra, sequela: Secondary | ICD-10-CM

## 2017-06-17 MED ORDER — OXYCODONE-ACETAMINOPHEN 5-325 MG PO TABS: 1.0000 | ORAL_TABLET | Freq: Four times a day (QID) | ORAL | 0 refills | Status: DC | PRN

## 2017-06-17 NOTE — Telephone Encounter (Signed)
Patient requested and will pick up NCCSRS Database Verified.  

## 2017-06-18 ENCOUNTER — Telehealth: Payer: Self-pay | Admitting: *Deleted

## 2017-06-18 NOTE — Telephone Encounter (Signed)
Pt's dtr, [name] states she would like an explanation of the labs ordered by Dr. Prudence Davidson.

## 2017-07-06 ENCOUNTER — Ambulatory Visit: Payer: PPO | Admitting: Podiatry

## 2017-07-14 ENCOUNTER — Other Ambulatory Visit: Payer: Self-pay | Admitting: Nurse Practitioner

## 2017-07-18 ENCOUNTER — Other Ambulatory Visit: Payer: Self-pay | Admitting: Internal Medicine

## 2017-07-18 DIAGNOSIS — S32048S Other fracture of fourth lumbar vertebra, sequela: Secondary | ICD-10-CM

## 2017-07-19 ENCOUNTER — Other Ambulatory Visit: Payer: Self-pay | Admitting: *Deleted

## 2017-07-19 DIAGNOSIS — S32048S Other fracture of fourth lumbar vertebra, sequela: Secondary | ICD-10-CM

## 2017-07-19 MED ORDER — OXYCODONE-ACETAMINOPHEN 5-325 MG PO TABS: 1.0000 | ORAL_TABLET | Freq: Four times a day (QID) | ORAL | 0 refills | Status: DC | PRN

## 2017-07-19 NOTE — Telephone Encounter (Signed)
[name], daughter called and requested refill for patient.  [place] Database Verified.  Pended Rx and sent to [facility].

## 2017-07-20 NOTE — Telephone Encounter (Signed)
[name], daughter notified and will pick up

## 2017-07-21 ENCOUNTER — Encounter: Payer: Self-pay | Admitting: Podiatry

## 2017-07-21 ENCOUNTER — Ambulatory Visit: Payer: PPO | Admitting: Podiatry

## 2017-07-21 DIAGNOSIS — L84 Corns and callosities: Secondary | ICD-10-CM | POA: Diagnosis not present

## 2017-07-21 DIAGNOSIS — G629 Polyneuropathy, unspecified: Secondary | ICD-10-CM

## 2017-07-21 DIAGNOSIS — M069 Rheumatoid arthritis, unspecified: Secondary | ICD-10-CM

## 2017-07-21 NOTE — Progress Notes (Signed)
This patient presents the office follow-up for diagnosis of an acute arthritic flareup on her left foot, especially her third toe.,  She was told to return to the office in one  week after her initial visit on June 10, 2017.  She had blood work ordered to reveal. RA  and gout to her   . She was prescribed a Medrol Dosepak which she then took.    She now presents the office for continued evaluation of her left foot. She presents the office today with her daughter for this visit.  This patient does minimal walking and presents to the office in a wheelchair   General Appearance  Alert, conversant and in no acute stress.  Vascular  Dorsalis pedis and posterior pulses are nt  palpable  Bilaterally due to her foot swelling..  Capillary return is within normal limits  Bilaterally. Cold feet  Noted  B/L.  Neurologic  Senn-Weinstein monofilament wire test within normal limits  bilaterally. Muscle power  Within normal limits bilaterally.  Nails Thick disfigured discolored nails with subungual debriis bilaterally from hallux to fifth toes bilaterally. No evidence of bacterial infection or drainage bilaterally.  Orthopedic  No limitations of motion of motion feet bilaterally.  No crepitus or effusions noted.  No bony pathology or digital deformities noted.she does significant contracture and swelling to all the digits 2 through 5 bilaterally. Her third toe left foot remains pinkish and swollen but has improved from her initial visit in November.  No streaking noted from the toe to the leg.  Her third toe is still enlarged relative to the other digits.       Skin  normotropic skin with no porokeratosis noted bilaterally.  There is a callus noted on the lateral aspect of the third toenail, left foot.  This is painful upon palpation.     Inflammation persists third toe left foot.      ROV  Examination of the left foot reveals the same amount of swelling as the right foot.  There is increased temperature of  the left foot relative to the right foot.  There is still enlargement of the third digit, left foot in the absence of any acute. Process  The acute process that was seen in November has resolved but she still has significant changes especially to her third toe, left foot.  I told the patient and her daughter that she still has gout and RA and there are changes in the digits related to these diseases.  I feel her foot has improved since the initial exam and that we need to keep an eye on her foot to prevent any more acute episodes.  Due to the long-term damage to the bones in the digits. I believe there is still redness and  swelling and inflammation , especially in the third toe left foot .  Patient should return to the office in 8 weeks for preventative foot care services. Upon debridement of the painful callus tissue third toe left. There was bleeding noted and then  she was then bandaged with Neosporin and a dry sterile dressing.   Gardiner Barefoot DPM

## 2017-08-19 ENCOUNTER — Other Ambulatory Visit: Payer: Self-pay | Admitting: Nurse Practitioner

## 2017-08-19 DIAGNOSIS — S32048S Other fracture of fourth lumbar vertebra, sequela: Secondary | ICD-10-CM

## 2017-08-20 ENCOUNTER — Other Ambulatory Visit: Payer: Self-pay | Admitting: *Deleted

## 2017-08-20 MED ORDER — OXYCODONE-ACETAMINOPHEN 5-325 MG PO TABS: 1.0000 | ORAL_TABLET | Freq: Four times a day (QID) | ORAL | 0 refills | Status: DC | PRN

## 2017-08-20 NOTE — Telephone Encounter (Signed)
Last filled 07/20/2017 Database checked and verified  Pt's daughter also asking for refill for lasix, said she discussed this with you

## 2017-08-23 ENCOUNTER — Other Ambulatory Visit: Payer: Self-pay | Admitting: *Deleted

## 2017-08-23 MED ORDER — DULOXETINE HCL 60 MG PO CPEP: ORAL_CAPSULE | ORAL | 0 refills | Status: DC

## 2017-08-23 NOTE — Telephone Encounter (Signed)
[facility] 

## 2017-09-13 ENCOUNTER — Other Ambulatory Visit: Payer: Self-pay | Admitting: *Deleted

## 2017-09-13 DIAGNOSIS — R600 Localized edema: Secondary | ICD-10-CM

## 2017-09-13 MED ORDER — FUROSEMIDE 20 MG PO TABS: ORAL_TABLET | ORAL | 1 refills | Status: DC

## 2017-09-13 MED ORDER — HYDROXYCHLOROQUINE SULFATE 200 MG PO TABS: ORAL_TABLET | ORAL | 1 refills | Status: DC

## 2017-09-13 NOTE — Telephone Encounter (Signed)
[facility] 

## 2017-09-13 NOTE — Telephone Encounter (Signed)
[facility] 

## 2017-09-14 ENCOUNTER — Other Ambulatory Visit: Payer: Self-pay | Admitting: *Deleted

## 2017-09-14 DIAGNOSIS — R600 Localized edema: Secondary | ICD-10-CM

## 2017-09-14 MED ORDER — FUROSEMIDE 20 MG PO TABS: ORAL_TABLET | ORAL | 0 refills | Status: DC

## 2017-09-14 MED ORDER — HYDROXYCHLOROQUINE SULFATE 200 MG PO TABS: ORAL_TABLET | ORAL | 0 refills | Status: DC

## 2017-09-14 NOTE — Telephone Encounter (Signed)
[facility] 

## 2017-09-17 ENCOUNTER — Telehealth: Payer: Self-pay | Admitting: Nurse Practitioner

## 2017-09-17 NOTE — Telephone Encounter (Signed)
Left msg 09/17/17 asking pt's daughter [name] to confirm this AWV/6 mo follow up appt. Last AWV 04/11/15.  VDM (DD)

## 2017-09-22 ENCOUNTER — Ambulatory Visit: Payer: PPO | Admitting: Podiatry

## 2017-10-19 ENCOUNTER — Ambulatory Visit: Payer: Self-pay | Admitting: Nurse Practitioner

## 2017-10-20 ENCOUNTER — Ambulatory Visit: Payer: PPO | Admitting: Nurse Practitioner

## 2017-10-20 ENCOUNTER — Ambulatory Visit (INDEPENDENT_AMBULATORY_CARE_PROVIDER_SITE_OTHER): Payer: PPO | Admitting: Nurse Practitioner

## 2017-10-20 ENCOUNTER — Ambulatory Visit: Payer: PPO

## 2017-10-20 ENCOUNTER — Encounter: Payer: Self-pay | Admitting: Nurse Practitioner

## 2017-10-20 VITALS — BP 148/88 | HR 73 | Temp 98.7°F | Ht 64.0 in | Wt 136.0 lb

## 2017-10-20 DIAGNOSIS — D638 Anemia in other chronic diseases classified elsewhere: Secondary | ICD-10-CM

## 2017-10-20 DIAGNOSIS — R600 Localized edema: Secondary | ICD-10-CM | POA: Diagnosis not present

## 2017-10-20 DIAGNOSIS — R5381 Other malaise: Secondary | ICD-10-CM

## 2017-10-20 DIAGNOSIS — M069 Rheumatoid arthritis, unspecified: Secondary | ICD-10-CM

## 2017-10-20 DIAGNOSIS — M25552 Pain in left hip: Secondary | ICD-10-CM

## 2017-10-20 MED ORDER — OXYCODONE-ACETAMINOPHEN 5-325 MG PO TABS: 1.0000 | ORAL_TABLET | Freq: Four times a day (QID) | ORAL | 0 refills | Status: DC | PRN

## 2017-10-20 NOTE — Progress Notes (Signed)
Careteam: Patient Care Team: [name], NP as PCP - General (Nurse Practitioner) Barrie Folk, MD (Inactive) as Referring Physician (Dermatology) Ladell Pier, MD as Consulting Physician (Oncology) Barrie Folk, MD (Inactive) as Referring Physician (Dermatology) Berle Mull, MD as Referring Physician (Oncology) Michael Boston, MD as Consulting Physician (General Surgery)  Advanced Directive information    No Known Allergies  Chief Complaint  Patient presents with  . Acute Visit    Pt is being seen due to ongoing left hip pain. pt states that hip is not hurting today but daughter states she thinks the pain is from patient laying/sitting on left side often.  . Other    daughter in room. would like to discuss possible pallative care referral.      HPI: Patient is a 84 y.o. female seen in the office today for 6 month follow up and left hip pain. Reports decrease in mobility. Missed last podiatry appt.   Rheumatoid arthritis- taking cymbalta and plaquenil    Keratosis/ melanoma- following with dermatology   Increase in left hip pain- decrease in mobility- sits and sleeps on left side.  Can not walk to bathroom; using bedside commode.  Increase edema to lower extremities, sits with legs down most the time Previously taking oxycodone-apap every 6 hours which has been effective in the past, does not have any currently. Rarely uses but occasionally will need.   Does not complain a lot but when she does she feels like it is something major. The other night she was complaining of decrease mobility and pain but feels like this is due to debility and generalized decline.    Not on a schedule sleeps throughout the day and does not eat meals as scheduled. Example today ate 2 breakfasts today, does not eat a lot of meat, eating cheese for protein mostly.   Review of Systems:  Review of Systems  Constitutional: Negative for chills and fever.  HENT:  Positive for hearing loss. Negative for congestion.   Eyes: Negative for pain.  Respiratory: Negative for cough, shortness of breath and wheezing.   Cardiovascular: Positive for leg swelling. Negative for chest pain, palpitations, orthopnea and PND.  Gastrointestinal: Negative for abdominal pain, blood in stool, constipation and melena.  Genitourinary: Negative for dysuria.  Musculoskeletal: Negative for falls.       Painful, red toe; ongoing  Skin:       Scarring on forehead from melanoma. Small lesion to left temple   Neurological: Positive for weakness. Negative for dizziness, loss of consciousness and headaches.  Endo/Heme/Allergies: Bruises/bleeds easily.  Psychiatric/Behavioral: Negative for depression and memory loss.    Past Medical History:  Diagnosis Date  . Allergy   . Anemia   . Blood transfusion without reported diagnosis   . Cancer ([place])   . Cataract   . Melanoma ([place])   . Rheumatoid arthritis(714.0)   . UTI (urinary tract infection)    Past Surgical History:  Procedure Laterality Date  . APPENDECTOMY    . BREAST SURGERY Left 2010   ? breast cancer  . FRACTURE SURGERY    . hip replacement Bilateral   . JOINT REPLACEMENT    . MASTECTOMY Left    Social History:   reports that  has never smoked. she has never used smokeless tobacco. She reports that she does not drink alcohol or use drugs.  Family History  Problem Relation Age of Onset  . Emphysema Father  smoked and was exposed to mustard gas  . COPD Father   . Cancer Brother   . Cancer Sister   . Cancer Sister        esophageal  . Hypertension Daughter   . Hypertension Son     Medications: Patient's Medications  New Prescriptions   No medications on file  Previous Medications   ACETAMINOPHEN (TYLENOL) 500 MG TABLET    Take 500-1,000 mg by mouth every 6 (six) hours as needed for mild pain.   BARRIER CREAM (NON-SPECIFIED) CREA    Apply 1 application topically 2 (two) times daily as needed (To  prevent bedsores.).    DULOXETINE (CYMBALTA) 60 MG CAPSULE    Take one capsule by mouth once daily   FUROSEMIDE (LASIX) 20 MG TABLET    Take one tablet by mouth once daily as needed for edema   HYDROXYCHLOROQUINE (PLAQUENIL) 200 MG TABLET    Take one tablet by mouth once daily   MAGNESIUM HYDROXIDE (MILK OF MAGNESIA) 800 MG/5ML SUSPENSION    Take 15 mLs by mouth daily.    MULTIPLE VITAMIN (MULTIVITAMIN) TABLET    Take 1 tablet by mouth daily.   NEOMYCIN-BACITRACIN-POLYMYXIN (NEOSPORIN) [phone] OINTMENT    Apply topically 3 (three) times daily. Right shin abrasion   NYSTATIN (MYCOSTATIN/NYSTOP) 100000 UNIT/GM POWD    Twice daily as needed to affected area   OVER THE COUNTER MEDICATION    Place 2 drops into both eyes as needed (For eye irritation.). [facility] Allergy Relief Eye Drops    OXYCODONE-ACETAMINOPHEN (PERCOCET) 5-325 MG TABLET    Take 1-2 tablets by mouth every 6 (six) hours as needed for severe pain.  Modified Medications   No medications on file  Discontinued Medications   DOXYCYCLINE (VIBRA-TABS) 100 MG TABLET    Take 1 tablet (100 mg total) by mouth 2 (two) times daily.   METHYLPREDNISOLONE (MEDROL DOSEPAK) 4 MG TBPK TABLET    As directed     Physical Exam:  Vitals:   10/20/17 1434  BP: (!) 148/88  Pulse: 73  Temp: 98.7 F (37.1 C)  TempSrc: Oral  SpO2: 99%  Weight: 136 lb (61.7 kg)  Height: 5\' 4"  (1.626 m)   Body mass index is 23.34 kg/m.  Physical Exam  Constitutional: She is oriented to person, place, and time. She appears well-developed. No distress.  Frail elderly female in NAD  HENT:  Head: Normocephalic and atraumatic.  Mouth/Throat: Oropharynx is clear and moist. No oropharyngeal exudate.  Eyes: Pupils are equal, round, and reactive to light. Conjunctivae are normal.  Neck: Normal range of motion. Neck supple.  Cardiovascular: Normal rate, regular rhythm, normal heart sounds and intact distal pulses.  Occasional irregular beat  Pulmonary/Chest: Effort  normal and breath sounds normal. No respiratory distress.  Abdominal: Soft. Bowel sounds are normal.  Musculoskeletal: She exhibits edema (2+ bilaterally) and tenderness (and redness to 3rd toe on left foot-unchanged from previous).       Right hip: She exhibits decreased range of motion and tenderness.       Left hip: She exhibits decreased range of motion and tenderness.  Neurological: She is alert and oriented to person, place, and time.  Skin: Skin is warm and dry. She is not diaphoretic.  Discoloration to forehead from skin removal, lesion healed- scarring noted  Psychiatric: She has a normal mood and affect.    Labs reviewed: Basic Metabolic Panel: Recent Labs    04/19/17 1527  NA 141  K 5.0  CL  105  CO2 29  GLUCOSE 108  BUN 26*  CREATININE 1.14*  CALCIUM 9.0  TSH 1.80   Liver Function Tests: Recent Labs    04/19/17 1527  AST 20  ALT 12  BILITOT 0.3  PROT 6.5   No results for input(s): LIPASE, AMYLASE in the last 8760 hours. No results for input(s): AMMONIA in the last 8760 hours. CBC: Recent Labs    04/19/17 1527  WBC 6.4  NEUTROABS 4,768  HGB 9.8*  HCT 29.6*  MCV 85.1  PLT 285   Lipid Panel: No results for input(s): CHOL, HDL, LDLCALC, TRIG, CHOLHDL, LDLDIRECT in the last 8760 hours. TSH: Recent Labs    04/19/17 1527  TSH 1.80   A1C: Lab Results  Component Value Date   HGBA1C 5.6 02/20/2016     Assessment/Plan 1. Anemia of chronic disease - CBC with Differential/Platelets  2. Bilateral edema of lower extremity -sleeps in recliner. Encouraged elevating legs above level of heart as much as possible.  -to use compression hose or socks during the day  3. Left hip pain -most likely due to progressive debility and arthritis, using percocet occasionally for severe pain.  -home health PT ordered  - oxyCODONE-acetaminophen (PERCOCET) 5-325 MG tablet; Take 1 tablet by mouth every 6 (six) hours as needed for severe pain.  Dispense: 30 tablet;  Refill: 0  4. Rheumatoid arthritis of hip, unspecified laterality, unspecified rheumatoid factor presence (HCC) - oxyCODONE-acetaminophen (PERCOCET) 5-325 MG tablet; Take 1 tablet by mouth every 6 (six) hours as needed for severe pain.  Dispense: 30 tablet; Refill: 0 - CBC with Differential/Platelets - COMPLETE METABOLIC PANEL WITH GFR - Ambulatory referral to [place] - Amb Referral to Palliative Care  5. Debility Sedentary, does not get up much which increase pain but then has decrease mobility. Will get home health PT/PT at this time.  - Ambulatory referral to [facility] - Amb Referral to Palliative Care due to progressive decline.   Next appt: 6 month follow up Onset. [place], [facility] [phone]

## 2017-10-21 LAB — COMPLETE METABOLIC PANEL WITH GFR
AG Ratio: 1.1 (calc) (ref 1.0–2.5)
ALBUMIN MSPROF: 3.9 g/dL (ref 3.6–5.1)
ALKALINE PHOSPHATASE (APISO): 82 U/L (ref 33–130)
ALT: 11 U/L (ref 6–29)
AST: 19 U/L (ref 10–35)
BUN / CREAT RATIO: 22 (calc) (ref 6–22)
BUN: 24 mg/dL (ref 7–25)
CALCIUM: 9.2 mg/dL (ref 8.6–10.4)
CO2: 28 mmol/L (ref 20–32)
CREATININE: 1.07 mg/dL — AB (ref 0.60–0.88)
Chloride: 105 mmol/L (ref 98–110)
GFR, EST NON AFRICAN AMERICAN: 44 mL/min/{1.73_m2} — AB (ref >=60)
GFR, Est African American: 51 mL/min/{1.73_m2} — ABNORMAL LOW (ref >=60)
GLOBULIN: 3.5 g/dL (ref 1.9–3.7)
GLUCOSE: 99 mg/dL (ref 65–139)
Potassium: 4.8 mmol/L (ref 3.5–5.3)
Sodium: 140 mmol/L (ref 135–146)
Total Bilirubin: 0.3 mg/dL (ref 0.2–1.2)
Total Protein: 7.4 g/dL (ref 6.1–8.1)

## 2017-10-21 LAB — CBC WITH DIFFERENTIAL/PLATELET
BASOS ABS: 73 {cells}/uL (ref 0–200)
Basophils Relative: 0.9 %
EOS PCT: 1.9 %
Eosinophils Absolute: 154 cells/uL (ref 15–500)
HEMATOCRIT: 29.9 % — AB (ref 35.0–45.0)
HEMOGLOBIN: 10.1 g/dL — AB (ref 11.7–15.5)
LYMPHS ABS: 1166 {cells}/uL (ref 850–3900)
MCH: 27.9 pg (ref 27.0–33.0)
MCHC: 33.8 g/dL (ref 32.0–36.0)
MCV: 82.6 fL (ref 80.0–100.0)
MONOS PCT: 8.8 %
MPV: 9 fL (ref 7.5–12.5)
NEUTROS ABS: 5994 {cells}/uL (ref 1500–7800)
Neutrophils Relative %: 74 %
Platelets: 313 10*3/uL (ref 140–400)
RBC: 3.62 10*6/uL — AB (ref 3.80–5.10)
RDW: 12.8 % (ref 11.0–15.0)
Total Lymphocyte: 14.4 %
WBC mixed population: 713 cells/uL (ref 200–950)
WBC: 8.1 10*3/uL (ref 3.8–10.8)

## 2017-10-30 DIAGNOSIS — M1610 Unilateral primary osteoarthritis, unspecified hip: Secondary | ICD-10-CM | POA: Diagnosis not present

## 2017-10-30 DIAGNOSIS — G629 Polyneuropathy, unspecified: Secondary | ICD-10-CM | POA: Diagnosis not present

## 2017-10-30 DIAGNOSIS — M6281 Muscle weakness (generalized): Secondary | ICD-10-CM | POA: Diagnosis not present

## 2017-10-30 DIAGNOSIS — R262 Difficulty in walking, not elsewhere classified: Secondary | ICD-10-CM | POA: Diagnosis not present

## 2017-11-25 ENCOUNTER — Telehealth: Payer: Self-pay

## 2017-11-25 ENCOUNTER — Telehealth: Payer: Self-pay | Admitting: *Deleted

## 2017-11-25 NOTE — Telephone Encounter (Signed)
I tried to call Patty Walters but her voicemail was full and I could not leave a message.   Palliative care referral is being processed and she should receive a call to schedule visit soon. Per [name]- no other advise aside from taking pain medication since patient unable to leave home and no mobile x ray available.

## 2017-11-25 NOTE — Telephone Encounter (Signed)
Patient daughter, [name] called and stated that she feels like patient is Transitioning. Stated that it is difficult for patient to get moving, walking and getting out. Stated that she is complaining about her Left hip hurting and more painful. Was seen for this 10/20/17, no better.  Daughter is wanting to know since it is so difficult to get patient out if there is a mobile x-ray that she can get to come to her home.  About 2 weeks ago she slid out of a chair down to the floor onto her bottom but daughter stated that patient stated she was fine. No trauma.   2. Daughter is also wondering about Palliative Care Referral with patient's decline and debility.   Please Advise.

## 2017-11-25 NOTE — Telephone Encounter (Signed)
Phone call placed to patient's daughter, [name], to offer to schedule visit with Palliative Care. Daughter provided update on patient's condition. Scheduled visit for NP on 11/29/17 @ 3:00pm

## 2017-11-25 NOTE — Telephone Encounter (Signed)
Patty Walters if you could check on this palliative care referral, I have also CC Caren Griffins in on this msg hopefully we can get someone out there soon, I am not aware of a mobile xray we could use to come out to the home but it is most likely due to debility and progressive arthritis.

## 2017-11-25 NOTE — Telephone Encounter (Signed)
I left a message for Patty Walters to call the office.

## 2017-11-26 ENCOUNTER — Other Ambulatory Visit: Payer: Self-pay

## 2017-11-26 DIAGNOSIS — M069 Rheumatoid arthritis, unspecified: Secondary | ICD-10-CM

## 2017-11-26 DIAGNOSIS — M25552 Pain in left hip: Secondary | ICD-10-CM

## 2017-11-26 MED ORDER — OXYCODONE-ACETAMINOPHEN 5-325 MG PO TABS: 1.0000 | ORAL_TABLET | Freq: Four times a day (QID) | ORAL | 0 refills | Status: DC | PRN

## 2017-11-26 NOTE — Telephone Encounter (Signed)
I spoke with [facility] and she verbalized understanding. They have a pending appointment with palliative care on 11/29/17 to access patient situation.   [name] did ask for a refill on oxycodone for patient. Rx was pended to [place] for approval. [place] database was verified.

## 2017-11-26 NOTE — Telephone Encounter (Signed)
Medication refill for oxycodone  5-325 mg. Rx was pended to provider for approval  after verifying last fill date, provider, and quantity on PMP [place]

## 2017-11-29 ENCOUNTER — Other Ambulatory Visit: Payer: PPO | Admitting: Internal Medicine

## 2017-11-29 DIAGNOSIS — R600 Localized edema: Secondary | ICD-10-CM

## 2017-11-29 NOTE — Progress Notes (Signed)
11/28/2017  PALLIATIVE CARE CONSULT Patty Walters D. DOB: Aug 22, 1921. [address] [place], [place] [address], [phone] REFERRING PROVIDER: Sherrie Mustache NP (OV: 10/20/2017) Q 3-4 months. POA: (dtr) Patty Walters (form in home) BILLABLE  ICD-10: Lower extremity edema (R60.0)  IMPRESSION/RECOMMENDATIONS: 1. Lower extremity edema: Dependent in nature.  a. Compression wraps; apply q am, remove at hs.  b. Continue Lasix 20mg  qd prn; PCG prefers to trial compression wraps. 2. Thick toenails:   a. Discussed with PCG that [employer] have a podiatrist who can make home visits for toe nail trimming; she will follow up. 3. Left hip pain: Episodic: muscular skeletal in presentation. Has oxycodone-APAP 5-325mg  available but finds best relief when taking prn Tylenol. 4. Gait instability from debility of old age/arthritis: ambulates independently with fairly steady gait/use of walker. Daughter encourages to follow daily chair exercise regimen left by past physical therapy consult. 5. Advanced Care Planning: Has photocopy of previously completed DNR/MOST form. I filled out 2 copies for the DNR. MOST form details: DNR, limited medical interventions, antibiotics and IVFs to be determined at time of treatment. Tube feeding choice was not entered. PCG wishes to discuss this option further with her sister.   a. Call in 3-4 weeks to check if Patty wishes me to stop by to complete MOST form, specifying tube feeding choice. 6. Care Management: Has transfer shower bench and walker. Two commodes; one over toilet and one in her bedroom, Has life alert, electric recliner, and hospital bed. Lives on second floor of daughter Patty's home. Bet is off every Wed; she scoots back to the house at lunch time twice a week, on the days that a hired care giver is not in the home. Private pay care giver 3 hours/day, 2 days a week. Assists with showers.  Another prn care giver that lives locally and can stay  overnight if necessary. 6. Two left LE skin lesions worrisome for skin cancer. Discussed with patient and PCG daughter Patty Walters; they do not wish to pursue work up.  I spent 75 minutes providing this consultation (from 3pm to 4:15pm). More than 50% of that time was spent coordinating communication. HPI: 84 yo female with h/o rheumatoid arthritis, keratosis/melanoma, and left hip pain. Referred to Palliative Care for assistance with symptom management, help with coordination of resources, and ongoing discussions regarding goals of care/advanced directives CODE STATUS: DNR, MOST form PPS: 50%.  HOSPICE ELIGIBILITY: No. Elderly but prognosis felt to be greater than 6 months.  MEDICATIONS :  Tylenol 500 q6hprn, Cymbalta 60mg  qd, Lasix 20mg  qd prn LE edema, Plaquenil 200mg  qd, MTV qd, oxycodone-apap 5-325 q6h prn. ALLERGIES: NKDA PAST MEDICAL HISTORY:  Allergies, Anemia of Chronic Disease, Cancer, Melanoma, Theumatoid arthritis, UTI, bilateral hip replacements, neuropathy LEs, left breast cancer (left mastectomy/oral chemo). SH: husband died when he was 42 airplane accident. 2 daughters, and son from first marriage; bot husbands deceased. Resides with daughter Patty Walters for last 7 years.  PHYSICAL EXAM: Well nourished, very pleasant, conversant elderly.  VS:  BP 132/70, 97%, HR 73, RR 20 HEENT: [place], AT LUNGS:  Bibasilar inspiratory cracklers CARDIAC: soft systolic murmur; RRR without rub/gallop ABD: soft, non-distended, NABS EXTREMITIES:  Bilateral tight LE edema; non-weeping.  MUSCULOSKELETAL: no contractures SKIN:  Two small lesions, both about 0.5cm round; one is to the right of left lower knee (small amount drainage), another dry raised, rough variegated lesion above left anterior knee.  NEURO: A & O x 3  Violeta Gelinas NP-C

## 2017-12-14 ENCOUNTER — Other Ambulatory Visit: Payer: Self-pay | Admitting: Nurse Practitioner

## 2017-12-14 DIAGNOSIS — R600 Localized edema: Secondary | ICD-10-CM

## 2018-02-07 ENCOUNTER — Telehealth: Payer: Self-pay

## 2018-02-07 NOTE — Telephone Encounter (Signed)
Received message to call patient's daughter, [name]. Unable to leave a message as mailbox is full.

## 2018-02-07 NOTE — Telephone Encounter (Signed)
Received return call from patient's daughter, [name], who shared that patient is sleeping more and that she feels patient is declining. Offered visit to be scheduled with NP. Daughter receptive. Scheduled for 02/08/18

## 2018-02-08 ENCOUNTER — Other Ambulatory Visit: Payer: PPO | Admitting: Internal Medicine

## 2018-02-10 ENCOUNTER — Other Ambulatory Visit: Payer: PPO | Admitting: Internal Medicine

## 2018-02-10 DIAGNOSIS — R2681 Unsteadiness on feet: Secondary | ICD-10-CM

## 2018-02-10 DIAGNOSIS — Z515 Encounter for palliative care: Secondary | ICD-10-CM

## 2018-02-10 DIAGNOSIS — M79601 Pain in right arm: Secondary | ICD-10-CM

## 2018-02-10 DIAGNOSIS — R600 Localized edema: Secondary | ICD-10-CM

## 2018-02-10 DIAGNOSIS — B351 Tinea unguium: Secondary | ICD-10-CM

## 2018-02-12 ENCOUNTER — Encounter: Payer: Self-pay | Admitting: Internal Medicine

## 2018-02-12 NOTE — Progress Notes (Signed)
02/10/2018 FOLLOW UP  PALLIATIVE CARE CONSULT Patty Walters D. DOB: 26-Jul-1922. [address] [place], [place] [address], [phone] REFERRING PROVIDER: Sherrie Mustache NP (OV: 10/20/2017) Q 3-4 months. POA: (dtr) Patty Walters (form in home)  BILLABLE  ICD-10: Lower extremity edema (R60.0)  IMPRESSION/RECOMMENDATIONS: 1. Lower extremity edema: Dependent in nature. -Continues LE compression wraps; daughter applying q am, removing hs.  Less edematous in am. Has prn Lasix 20mg  available; not using.             a. Discussed with daughter trial of Lasix 20 mg po 3 x/week and monitor for improvement LE swelling. Eat a banana on Lasix days for potassium supplementation.  2. Thick toenails/onychomycosis:              a. Discussed resources for home toenail clipping ([facility] [employer]; call local podiatrists for suggestions).  Daughter interested in homeopathic remedies for toe nail fungus. We discussed use of Vicks Vapo Rub to affected nails, bid.  3. Right arm discomfort: Episodic; muscular skeletal in presentation. Exacerbated with manipulating walker. Well managed with prn Tylenol and  opical OTC menthol ointment   4. Gait instability from debility of old age/arthritis:  -Continues to ambulate independently with fairly steady gait/use of walker. Patient effectively uses electric recliner lift; able to get herself independently out of bed. No falls. She bruised her left forehead when she fell asleep while sitting in chair; leaned forward and struck that area on the side table.  Daughter considering restarting home physical therapy and home health aid (if insurance will cover while patient seeing physical therapist). She will ask [facility] office for referral.  5. Advanced Care Planning: Has photocopy of previously completed DNR/MOST form.  I forget to discuss tube feeding option today; will address in future visit.   6. Care Management:  -Daughter was using private pay  nursing assistants 3 hours daily in the home to assist patient with personal care/showers, but discontinued their services b/c she felt those particular workers weren't doing their job. She is planning on resourcing the local nursing schools for students that might be looking for extra money. There is a prn care giver that lives locally and can stay overnight if necessary. Daughter and PCG Patty Walters has restarted full time work, but stops by the home at lunch QD, and has Wednesdays off.   7. Follow up: Daughter requests regularly scheduled Palliative Care visits about q 2 months.  I spent 40 minutes providing this consultation (from 3:30 pm to 4:10pm). More than 50% of that time was spent coordinating communication.  HPI: Follow up palliative care visit for 84 yo female with h/o rheumatoid arthritis, keratosis/melanoma, and left hip pain. Initial visit 11/28/2017; daughter requested f/u Palliative Care visit to assess LE swelling, right upper forehead bruise from prior injury, and check bottom for signs of breakdown.   CODE STATUS: DNR, MOST form PPS: 50%.  HOSPICE ELIGIBILITY: No. Elderly but prognosis felt to be greater than 6 months.   MEDICATIONS :  Tylenol 500 q6hprn, Cymbalta 60mg  qd, Lasix 20mg  qd prn LE edema, Plaquenil 200mg  qd, MTV qd, oxycodone-apap 5-325 q6h prn (not taking), PRN Tylenol.. ALLERGIES: NKDA  PAST MEDICAL HISTORY:  Allergies, Anemia of Chronic Disease, Cancer, Melanoma, Rheumatoid arthritis, UTI, bilateral hip replacements, neuropathy LEs, left breast cancer (left mastectomy/oral chemo). SH: husband died when he was 64 airplane accident. 2 daughters, and son from first marriage; bot husbands deceased. Resides with daughter Patty Walters for last 7 years.  PHYSICAL EXAM: Well nourished, pleasantly, conversant elderly female sitting in Optician, dispensing. Able to stand independently with recliner lift assist, which she adequately controls.  VS:  BP 144/74, 97%, HR 76, RR 20 HEENT: [place],  AT. Very HOH. LUNGS:  CTA CARDIAC: soft systolic murmur; RRR without rub/gallop ABD: soft, non-distended, NABS EXTREMITIES:  Bilateral tight LE edema; non-weeping. Elastic leg wraps in place MUSCULOSKELETAL: no contractures SKIN:  2 small areas skin slough (about 0.5 cm) one on each upper anterior shin; no signs infection/inflammation. Appear from prior popped blister. Lower inner buttocks with some purplish skin discoloration; no pressure injuries. Right forehead with small resolving bruise; not raised, red or inflamed.  NEURO: A & O x 3   Patty Gelinas NP-C

## 2018-02-27 ENCOUNTER — Other Ambulatory Visit: Payer: Self-pay | Admitting: Nurse Practitioner

## 2018-02-27 DIAGNOSIS — M069 Rheumatoid arthritis, unspecified: Secondary | ICD-10-CM

## 2018-02-27 DIAGNOSIS — M25552 Pain in left hip: Secondary | ICD-10-CM

## 2018-02-28 MED ORDER — OXYCODONE-ACETAMINOPHEN 5-325 MG PO TABS: 1.0000 | ORAL_TABLET | Freq: Four times a day (QID) | ORAL | 0 refills | Status: DC | PRN

## 2018-02-28 NOTE — Telephone Encounter (Signed)
A medication refill was received from pharmacy for oxycodone/apap 5-325 mg.Rx was called in to pharmacy after verifying last fill date, provider, and quantity on PMP [place].   Last refill was 11/26/17

## 2018-03-04 ENCOUNTER — Other Ambulatory Visit: Payer: Self-pay | Admitting: Nurse Practitioner

## 2018-06-22 ENCOUNTER — Other Ambulatory Visit: Payer: Self-pay | Admitting: Internal Medicine

## 2018-06-22 DIAGNOSIS — M069 Rheumatoid arthritis, unspecified: Secondary | ICD-10-CM

## 2018-06-22 DIAGNOSIS — M25552 Pain in left hip: Secondary | ICD-10-CM

## 2018-06-22 MED ORDER — OXYCODONE-ACETAMINOPHEN 5-325 MG PO TABS: 1.0000 | ORAL_TABLET | Freq: Four times a day (QID) | ORAL | 0 refills | Status: DC | PRN

## 2018-06-22 NOTE — Telephone Encounter (Addendum)
[place] Database verified and compliance confirmed   Last filled 02/28/18, # 15 (5 day supply), prior refill 11/26/17 (7 day supply)   Last OV 10/20/17

## 2018-07-12 ENCOUNTER — Telehealth: Payer: Self-pay | Admitting: *Deleted

## 2018-07-12 ENCOUNTER — Telehealth: Payer: Self-pay

## 2018-07-12 NOTE — Telephone Encounter (Signed)
Phone call placed to patient's daughter, [name], to offer to schedule a follow up visit with NP. Scheduled for 07/15/18

## 2018-07-12 NOTE — Telephone Encounter (Signed)
Patient daughter called and stated that Inez Catalina with [facility] told her to give our office a call to see if she could come by our office and pick up a flu shot for the Palliative Nurse to give to the patient on Friday while at her home. Daughter stated that it is hard to get patient out of the home and the nurse is offering to give flu shot in home. Please Advise.

## 2018-07-13 NOTE — Telephone Encounter (Signed)
Unfortunately we are unable to do this.

## 2018-07-13 NOTE — Telephone Encounter (Signed)
Tried calling [name] back but mail box is full and cannot leave message. Will try again later.

## 2018-07-13 NOTE — Telephone Encounter (Signed)
I discussed with Patty Walters that we are unable to do this. Patty Walters verbalized understanding.

## 2018-07-15 ENCOUNTER — Other Ambulatory Visit: Payer: PPO | Admitting: Internal Medicine

## 2018-07-15 ENCOUNTER — Telehealth: Payer: Self-pay | Admitting: Internal Medicine

## 2018-07-15 DIAGNOSIS — Z515 Encounter for palliative care: Secondary | ICD-10-CM

## 2018-07-17 NOTE — Progress Notes (Signed)
Patient rescheduled. No charge.

## 2018-07-18 ENCOUNTER — Encounter: Payer: Self-pay | Admitting: Internal Medicine

## 2018-07-18 ENCOUNTER — Other Ambulatory Visit: Payer: PPO | Admitting: Internal Medicine

## 2018-07-18 DIAGNOSIS — R600 Localized edema: Secondary | ICD-10-CM | POA: Diagnosis not present

## 2018-07-18 DIAGNOSIS — L03115 Cellulitis of right lower limb: Secondary | ICD-10-CM

## 2018-07-18 NOTE — Progress Notes (Signed)
[facility] Telephone: [phone] Fax: [phone]  PATIENT NAME: Patty Walters DOB: 13-Feb-1922 MRN: [id number]  [address] [place], [place] [address], [phone]  PRIMARY CARE PROVIDER:   [name], NP  REFERRING PROVIDER:  [name], NP [place], [place] [address]  RESPONSIBLE PARTY:   POA: (dtr) [name]  HISTORY OF PRESENT ILLNESS / INTERVAL HISTORY: Follow up palliative care visit for 84 yo female with h/o rheumatoid arthritis, keratosis/melanoma, and left hip pain. F/U Palliative Care visit requested by daughter to assess significance of LE skin changes and skin care suggestions for perineal and buttocks area.t       IMPRESSION/RECOMMENDATIONS: 1. Marked LE bilateral extremity edema with R LE changes suspicious for cellulitis.  Tightly pitting LE swelling toe to high calf with rednesss. R > L with increased right LE warmth and swelling as c/w left.  -Keflex 250mg  BID x 10d (lower dose d/t older age, renal insufficiency (10/2017 Bun 24, Creatinine 1.07) -Lasix 40mg  qd x 3days. Daughter aware to give patient high potassium foods (2 bananas a day) while on Lasix. Her last labs in March show a high normal potassium, so I won't add a supplement. -Resume LE compression wraps. Daughter plans to purchase; apply qam, remove qhs. Encourage LE elevation though patient resistant..  4. Gait instability from debility of old age/arthritis: Stable. -Continues to ambulate independently with fairly steady gait/use of walker. Patient effectively uses electric recliner lift; able to get herself independently out of bed. No falls.   5. Advanced Care Planning: has DNR/MOST form.  6. Perineal Care: Soapy wash with good rinse. Barrier cream to buttocks and peri-rectal, with corn starch groin.   7. Follow up: Daughter requests regularly scheduled Palliative Care visits second Friday of each month, at 4pm.  I spent 40 minutes  providing this consultation (from 4 pm to 4:40pm). More than 50% of that time was spent coordinating communication.   CODE STATUS: DNR, MOST form PPS: 50%.  HOSPICE ELIGIBILITY: No. Elderly but prognosis felt to be greater than 6 months.    CODE STATUS: DNR, MOST form  PPS: 50% HOSPICE ELIGIBILITY/DIAGNOSIS: No. Elderly but prognosis felt to be greater than 6 months.   PAST MEDICAL HISTORY:  Past Medical History:  Diagnosis Date  . Allergy   . Anemia   . Blood transfusion without reported diagnosis   . Cancer ([place])   . Cataract   . Melanoma ([place])   . Rheumatoid arthritis(714.0)   . UTI (urinary tract infection)     SOCIAL HX:  Social History   Tobacco Use  . Smoking status: Never Smoker  . Smokeless tobacco: Never Used  Substance Use Topics  . Alcohol use: No    ALLERGIES: No Known Allergies   PERTINENT MEDICATIONS:  Outpatient Encounter Medications as of 07/18/2018  Medication Sig  . [START ON 07/19/2018] cephALEXin (KEFLEX) 250 MG capsule Take by mouth 2 (two) times daily.  Marland Kitchen acetaminophen (TYLENOL) 500 MG tablet Take 500-1,000 mg by mouth every 6 (six) hours as needed for mild pain.  . barrier cream (NON-SPECIFIED) CREA Apply 1 application topically 2 (two) times daily as needed (To prevent bedsores.).   Marland Kitchen DULoxetine (CYMBALTA) 60 MG capsule TAKE 1 CAPSULE BY MOUTH EVERY DAY  . furosemide (LASIX) 20 MG tablet TAKE ONE TABLET BY MOUTH ONCE DAILY AS NEEDED FOR EDEMA  . hydroxychloroquine (PLAQUENIL) 200 MG tablet TAKE 1 TABLET BY MOUTH EVERY DAY  . magnesium hydroxide (MILK OF MAGNESIA)  800 MG/5ML suspension Take 15 mLs by mouth daily.   . Multiple Vitamin (MULTIVITAMIN) tablet Take 1 tablet by mouth daily.  Marland Kitchen neomycin-bacitracin-polymyxin (NEOSPORIN) [phone] ointment Apply topically 3 (three) times daily. Right shin abrasion  . nystatin (MYCOSTATIN/NYSTOP) 100000 UNIT/GM POWD Twice daily as needed to affected area  . OVER THE COUNTER MEDICATION Place 2 drops into  both eyes as needed (For eye irritation.). [facility] Eye Allergy Relief Eye Drops   . oxyCODONE-acetaminophen (PERCOCET) 5-325 MG tablet Take 1 tablet by mouth every 6 (six) hours as needed for severe pain.   No facility-administered encounter medications on file as of 07/18/2018.     PHYSICAL EXAM:  Well nourished, pleasantly, conversant elderly female sitting in Optician, dispensing. Able to stand independently with recliner lift assist, which she adequately controls.  HEENT: [place], AT. Very HOH. LUNGS: CTA CARDIAC: soft systolic murmur; RRR without rub/gallop ABD: soft, non-distended, NABS EXTREMITIES: Bilateral tight LE edema R>L to high calves; bilateral LE redness R>L. non-weeping.  MUSCULOSKELETAL: no contractures SKIN: Intact. Lower inner buttocks with some purplish skin discoloration; no pressure injuries.   NEURO: A & O x 3  Violeta Gelinas NP-C

## 2018-07-20 ENCOUNTER — Ambulatory Visit (INDEPENDENT_AMBULATORY_CARE_PROVIDER_SITE_OTHER): Payer: PPO

## 2018-07-20 DIAGNOSIS — Z23 Encounter for immunization: Secondary | ICD-10-CM | POA: Diagnosis not present

## 2018-07-21 ENCOUNTER — Other Ambulatory Visit: Payer: Self-pay | Admitting: Nurse Practitioner

## 2018-07-21 DIAGNOSIS — M069 Rheumatoid arthritis, unspecified: Secondary | ICD-10-CM

## 2018-07-21 DIAGNOSIS — M25552 Pain in left hip: Secondary | ICD-10-CM

## 2018-07-21 MED ORDER — OXYCODONE-ACETAMINOPHEN 5-325 MG PO TABS: 1.0000 | ORAL_TABLET | Freq: Four times a day (QID) | ORAL | 0 refills | Status: DC | PRN

## 2018-07-21 NOTE — Telephone Encounter (Signed)
[employer] Database verified and compliance confirmed   Last filled 06/22/18

## 2018-08-11 ENCOUNTER — Other Ambulatory Visit: Payer: PPO | Admitting: Internal Medicine

## 2018-08-12 ENCOUNTER — Other Ambulatory Visit: Payer: PPO | Admitting: Internal Medicine

## 2018-08-19 ENCOUNTER — Encounter: Payer: Self-pay | Admitting: Internal Medicine

## 2018-08-19 ENCOUNTER — Other Ambulatory Visit: Payer: PPO | Admitting: Internal Medicine

## 2018-08-19 DIAGNOSIS — Z515 Encounter for palliative care: Secondary | ICD-10-CM

## 2018-08-19 NOTE — Progress Notes (Signed)
Jan 17th, 2020 [facility] Telephone: [phone] Fax: [phone]  PATIENT NAME: Patty Walters DOB: 1921/10/04 MRN: [id number]  [address] [place], [place] [address], [phone]  PRIMARY CARE PROVIDER:   [name], NP  REFERRING PROVIDER:  [name], NP [place], [place] [address]  RESPONSIBLE PARTY:   POA: (dtr) [name] [phone]   HISTORY OF PRESENT ILLNESS / INTERVAL HISTORY: Follow up palliative care visit for 85 yo female with h/o rheumatoid arthritis, keratosis/melanoma, and left hip pain. This is a routine F/U Palliative Care visit from 07/18/18.     IMPRESSION/RECOMMENDATIONS: 1. Marked LE bilateral extremity edema: Resolution of R LE warmth and increased swelling as c/w left. However, both LE markedly edematous. PCG daughter [name] mentions that patient did not have any appreciable improvement in LE swelling with past trial of lasix 40mg  qd x 3d. [name] discontinued the lasix after that earlier trial. -Start Lasix 80 mg qd x 3 day; K supplementation of 40MEQ/ day while on Lasix dose. [name] wishes patient to take OTC KCL equivalent to a 40 MEQ dose. I asked her to resource the pharmacist to assist her to identify which product would equal that dose. -Continue LE compression wraps on q am and off q hs. Encourage LE elevation.  2. Slow functional decline d/t deconditioning/advanced age: Patient still able to transfer independently and ambulate with a very slow gait down hallway, but she admits to increased fatigue. LE edema prob contribution to difficulty with ambulation as well. [name] planning to reposition BSC closer to patient's bed at night.  -Ask PCP for home PT evaluation and treat, if insurance will allow. -We reviewed chair exercises that patient can do. Suggested she try to do some of these every time a commercial comes on, during her TV programs.   3. Right neck lesion about 1/2 cm round. Raised and  indurated. No drainage. Non-tender. Had small scab which patient had scraped off. Daughter wishes conservative management only. Doesn't want to seek dermatology opinion.  -warm compresses bid -neosporin ointment or petroleum jelly application.   4. Advanced Care Planning: has DNR/MOST form in place.  5. Follow up: Daughter requests regularly scheduled Palliative Care visits second Friday of each month, at 4pm.  I spent 7minutes providing this consultation (from 4:15 pm to 4:45pm). More than 50% of that time was spent coordinating communication.  CODE STATUS: DNR, MOST form  PPS: 50% HOSPICE ELIGIBILITY/DIAGNOSIS: No. Elderly but prognosis felt to be greater than 6 months.   PAST MEDICAL HISTORY:  Past Medical History:  Diagnosis Date  . Allergy   . Anemia   . Blood transfusion without reported diagnosis   . Cancer ([place])   . Cataract   . Melanoma ([place])   . Rheumatoid arthritis(714.0)   . UTI (urinary tract infection)     SOCIAL HX:  Social History   Tobacco Use  . Smoking status: Never Smoker  . Smokeless tobacco: Never Used  Substance Use Topics  . Alcohol use: No    ALLERGIES: No Known Allergies   PERTINENT MEDICATIONS:  Outpatient Encounter Medications as of 08/19/2018  Medication Sig  . acetaminophen (TYLENOL) 500 MG tablet Take 500-1,000 mg by mouth every 6 (six) hours as needed for mild pain.  . barrier cream (NON-SPECIFIED) CREA Apply 1 application topically 2 (two) times daily as needed (To prevent bedsores.).   Marland Kitchen DULoxetine (CYMBALTA) 60 MG capsule TAKE 1 CAPSULE BY MOUTH EVERY DAY  . furosemide (LASIX)  20 MG tablet TAKE ONE TABLET BY MOUTH ONCE DAILY AS NEEDED FOR EDEMA  . hydroxychloroquine (PLAQUENIL) 200 MG tablet TAKE 1 TABLET BY MOUTH EVERY DAY  . magnesium hydroxide (MILK OF MAGNESIA) 800 MG/5ML suspension Take 15 mLs by mouth daily.   . Multiple Vitamin (MULTIVITAMIN) tablet Take 1 tablet by mouth daily.  Marland Kitchen neomycin-bacitracin-polymyxin (NEOSPORIN)  [phone] ointment Apply topically 3 (three) times daily. Right shin abrasion  . nystatin (MYCOSTATIN/NYSTOP) 100000 UNIT/GM POWD Twice daily as needed to affected area  . OVER THE COUNTER MEDICATION Place 2 drops into both eyes as needed (For eye irritation.). [facility] Eye Allergy Relief Eye Drops   . oxyCODONE-acetaminophen (PERCOCET) 5-325 MG tablet Take 1 tablet by mouth every 6 (six) hours as needed for severe pain.   No facility-administered encounter medications on file as of 08/19/2018.     PHYSICAL EXAM:  VS: BP: 132/82, Hr 88, RR 20 Well nourished, pleasantly, conversant elderly female sitting in Optician, dispensing. Able to stand independently with recliner lift assist, which she adequately controls.  HEENT: [place], AT. Very HOH. LUNGS: Bibasilar soft inspiratory crackles CARDIAC: soft systolic murmur; RRR without rub/gallop ABD: soft, non-distended, NABS EXTREMITIES: Bilateral tight LE edema high calves. non-weeping.  MUSCULOSKELETAL: no contractures  NEURO: A & O x 3  Violeta Gelinas NP-C

## 2018-08-25 ENCOUNTER — Other Ambulatory Visit: Payer: Self-pay

## 2018-08-25 ENCOUNTER — Other Ambulatory Visit: Payer: Self-pay | Admitting: Nurse Practitioner

## 2018-08-25 ENCOUNTER — Emergency Department (HOSPITAL_COMMUNITY): Payer: PPO

## 2018-08-25 ENCOUNTER — Emergency Department (HOSPITAL_COMMUNITY)
Admission: EM | Admit: 2018-08-25 | Discharge: 2018-08-25 | Disposition: A | Discharge status: Home / Self Care | Payer: PPO | Attending: Emergency Medicine | Admitting: Emergency Medicine

## 2018-08-25 DIAGNOSIS — R54 Age-related physical debility: Secondary | ICD-10-CM | POA: Diagnosis not present

## 2018-08-25 DIAGNOSIS — Y92009 Unspecified place in unspecified non-institutional (private) residence as the place of occurrence of the external cause: Secondary | ICD-10-CM | POA: Diagnosis not present

## 2018-08-25 DIAGNOSIS — M25552 Pain in left hip: Secondary | ICD-10-CM

## 2018-08-25 DIAGNOSIS — F0390 Unspecified dementia without behavioral disturbance: Secondary | ICD-10-CM | POA: Insufficient documentation

## 2018-08-25 DIAGNOSIS — R0902 Hypoxemia: Secondary | ICD-10-CM | POA: Diagnosis not present

## 2018-08-25 DIAGNOSIS — D649 Anemia, unspecified: Secondary | ICD-10-CM | POA: Diagnosis not present

## 2018-08-25 DIAGNOSIS — W19XXXA Unspecified fall, initial encounter: Secondary | ICD-10-CM | POA: Insufficient documentation

## 2018-08-25 DIAGNOSIS — Z7984 Long term (current) use of oral hypoglycemic drugs: Secondary | ICD-10-CM | POA: Diagnosis not present

## 2018-08-25 DIAGNOSIS — Z7189 Other specified counseling: Secondary | ICD-10-CM

## 2018-08-25 DIAGNOSIS — Y939 Activity, unspecified: Secondary | ICD-10-CM | POA: Insufficient documentation

## 2018-08-25 DIAGNOSIS — G459 Transient cerebral ischemic attack, unspecified: Secondary | ICD-10-CM | POA: Diagnosis not present

## 2018-08-25 DIAGNOSIS — Z7401 Bed confinement status: Secondary | ICD-10-CM | POA: Diagnosis not present

## 2018-08-25 DIAGNOSIS — M5489 Other dorsalgia: Secondary | ICD-10-CM | POA: Diagnosis not present

## 2018-08-25 DIAGNOSIS — Y998 Other external cause status: Secondary | ICD-10-CM | POA: Diagnosis not present

## 2018-08-25 DIAGNOSIS — I10 Essential (primary) hypertension: Secondary | ICD-10-CM | POA: Diagnosis not present

## 2018-08-25 DIAGNOSIS — M2550 Pain in unspecified joint: Secondary | ICD-10-CM | POA: Diagnosis not present

## 2018-08-25 DIAGNOSIS — I619 Nontraumatic intracerebral hemorrhage, unspecified: Secondary | ICD-10-CM | POA: Diagnosis not present

## 2018-08-25 DIAGNOSIS — S0003XA Contusion of scalp, initial encounter: Secondary | ICD-10-CM | POA: Diagnosis not present

## 2018-08-25 DIAGNOSIS — R410 Disorientation, unspecified: Secondary | ICD-10-CM | POA: Diagnosis not present

## 2018-08-25 DIAGNOSIS — I629 Nontraumatic intracranial hemorrhage, unspecified: Secondary | ICD-10-CM | POA: Insufficient documentation

## 2018-08-25 DIAGNOSIS — M25512 Pain in left shoulder: Secondary | ICD-10-CM | POA: Insufficient documentation

## 2018-08-25 DIAGNOSIS — R600 Localized edema: Secondary | ICD-10-CM | POA: Insufficient documentation

## 2018-08-25 DIAGNOSIS — R52 Pain, unspecified: Secondary | ICD-10-CM | POA: Diagnosis not present

## 2018-08-25 DIAGNOSIS — R5381 Other malaise: Secondary | ICD-10-CM | POA: Diagnosis not present

## 2018-08-25 DIAGNOSIS — S4992XA Unspecified injury of left shoulder and upper arm, initial encounter: Secondary | ICD-10-CM | POA: Diagnosis not present

## 2018-08-25 DIAGNOSIS — M069 Rheumatoid arthritis, unspecified: Secondary | ICD-10-CM

## 2018-08-25 DIAGNOSIS — R58 Hemorrhage, not elsewhere classified: Secondary | ICD-10-CM | POA: Diagnosis not present

## 2018-08-25 LAB — CBC
HCT: 32.9 % — ABNORMAL LOW (ref 36.0–46.0)
Hemoglobin: 10.4 g/dL — ABNORMAL LOW (ref 12.0–15.0)
MCH: 28.3 pg (ref 26.0–34.0)
MCHC: 31.6 g/dL (ref 30.0–36.0)
MCV: 89.6 fL (ref 80.0–100.0)
Platelets: 274 10*3/uL (ref 150–400)
RBC: 3.67 MIL/uL — ABNORMAL LOW (ref 3.87–5.11)
RDW: 13.7 % (ref 11.5–15.5)
WBC: 10.2 10*3/uL (ref 4.0–10.5)
nRBC: 0 % (ref 0.0–0.2)

## 2018-08-25 LAB — COMPREHENSIVE METABOLIC PANEL
ALK PHOS: 58 U/L (ref 38–126)
ALT: 13 U/L (ref 0–44)
AST: 24 U/L (ref 15–41)
Albumin: 3.6 g/dL (ref 3.5–5.0)
Anion gap: 9 (ref 5–15)
BUN: 29 mg/dL — AB (ref 8–23)
CO2: 24 mmol/L (ref 22–32)
Calcium: 8.9 mg/dL (ref 8.9–10.3)
Chloride: 109 mmol/L (ref 98–111)
Creatinine, Ser: 1.69 mg/dL — ABNORMAL HIGH (ref 0.44–1.00)
GFR calc Af Amer: 30 mL/min — ABNORMAL LOW (ref >60)
GFR calc non Af Amer: 26 mL/min — ABNORMAL LOW (ref >60)
Glucose, Bld: 93 mg/dL (ref 70–99)
Potassium: 4.5 mmol/L (ref 3.5–5.1)
Sodium: 142 mmol/L (ref 135–145)
Total Bilirubin: 0.6 mg/dL (ref 0.3–1.2)
Total Protein: 7.2 g/dL (ref 6.5–8.1)

## 2018-08-25 LAB — DIFFERENTIAL
ABS IMMATURE GRANULOCYTES: 0.02 10*3/uL (ref 0.00–0.07)
Basophils Absolute: 0.1 10*3/uL (ref 0.0–0.1)
Basophils Relative: 1 %
Eosinophils Absolute: 0.1 10*3/uL (ref 0.0–0.5)
Eosinophils Relative: 1 %
Immature Granulocytes: 0 %
Lymphocytes Relative: 12 %
Lymphs Abs: 1.2 10*3/uL (ref 0.7–4.0)
Monocytes Absolute: 0.6 10*3/uL (ref 0.1–1.0)
Monocytes Relative: 6 %
Neutro Abs: 8.2 10*3/uL — ABNORMAL HIGH (ref 1.7–7.7)
Neutrophils Relative %: 80 %

## 2018-08-25 LAB — APTT: aPTT: 27 seconds (ref 24–36)

## 2018-08-25 LAB — ETHANOL: Alcohol, Ethyl (B): <10 mg/dL (ref <10)

## 2018-08-25 LAB — TROPONIN I: Troponin I: 0.05 ng/mL (ref <0.03)

## 2018-08-25 LAB — PROTIME-INR
INR: 1.09
PROTHROMBIN TIME: 14 s (ref 11.4–15.2)

## 2018-08-25 NOTE — ED Provider Notes (Signed)
[facility] EMERGENCY DEPARTMENT Provider Note   CSN: [id number] Arrival date & time: 08/25/18  3500     History   Chief Complaint Chief Complaint  Patient presents with  . Fall    HPI Patty Walters is a 85 y.o. female.  The history is provided by a relative. The history is limited by the condition of the patient (Dementia).  She has history of melanoma, rheumatoid arthritis, peripheral edema and was brought in by ambulance after having a fall at home.  She does not recall falling, her daughter found her laying on the floor.  She was complaining of pain in her left shoulder but denying other injury.  Past Medical History:  Diagnosis Date  . Allergy   . Anemia   . Blood transfusion without reported diagnosis   . Cancer ([place])   . Cataract   . Melanoma ([place])   . Rheumatoid arthritis(714.0)   . UTI (urinary tract infection)     Patient Active Problem List   Diagnosis Date Noted  . Lumbar vertebral fracture ([place]) 02/20/2016  . Melanoma ([place]) 04/05/2015  . Constipation 02/21/2015  . Edema 02/21/2015  . Melanoma of skin ([place]) 12/27/2014  . Cough 06/13/2013  . Chronic anemia 02/29/2012  . Insomnia 09/10/2011  . Risk for falls 09/10/2011  . Neuropathy 09/10/2011  . Rheumatoid arthritis ([place]) 09/10/2011    Past Surgical History:  Procedure Laterality Date  . APPENDECTOMY    . BREAST SURGERY Left 2010   ? breast cancer  . FRACTURE SURGERY    . hip replacement Bilateral   . JOINT REPLACEMENT    . MASTECTOMY Left      OB History   No obstetric history on file.      Home Medications    Prior to Admission medications   Medication Sig Start Date End Date Taking? Authorizing Provider  acetaminophen (TYLENOL) 500 MG tablet Take 500-1,000 mg by mouth every 6 (six) hours as needed for mild pain.    [provider]  barrier cream (NON-SPECIFIED) CREA Apply 1 application topically 2 (two) times daily as needed (To prevent bedsores.).      [provider]  DULoxetine (CYMBALTA) 60 MG capsule TAKE 1 CAPSULE BY MOUTH EVERY DAY 12/14/17   [name], NP  furosemide (LASIX) 20 MG tablet TAKE ONE TABLET BY MOUTH ONCE DAILY AS NEEDED FOR EDEMA 12/14/17   [name], NP  hydroxychloroquine (PLAQUENIL) 200 MG tablet TAKE 1 TABLET BY MOUTH EVERY DAY 03/04/18   [name], NP  magnesium hydroxide (MILK OF MAGNESIA) 800 MG/5ML suspension Take 15 mLs by mouth daily.     [provider]  Multiple Vitamin (MULTIVITAMIN) tablet Take 1 tablet by mouth daily.    [provider]  neomycin-bacitracin-polymyxin (NEOSPORIN) [phone] ointment Apply topically 3 (three) times daily. Right shin abrasion 02/22/16   Verlee Monte, MD  nystatin (MYCOSTATIN/NYSTOP) 100000 UNIT/GM POWD Twice daily as needed to affected area 09/03/15   [name], NP  OVER THE COUNTER MEDICATION Place 2 drops into both eyes as needed (For eye irritation.). [facility] Allergy Relief Eye Drops     [provider]  oxyCODONE-acetaminophen (PERCOCET) 5-325 MG tablet Take 1 tablet by mouth every 6 (six) hours as needed for severe pain. 07/21/18   [name], NP    Family History Family History  Problem Relation Age of Onset  . Emphysema Father        smoked and was  exposed to mustard gas  . COPD Father   . Cancer Brother   . Cancer Sister   . Cancer Sister        esophageal  . Hypertension Daughter   . Hypertension Son     Social History Social History   Tobacco Use  . Smoking status: Never Smoker  . Smokeless tobacco: Never Used  Substance Use Topics  . Alcohol use: No  . Drug use: No     Allergies   Patient has no known allergies.   Review of Systems Review of Systems  Unable to perform ROS: Dementia  All other systems reviewed and are negative.    Physical Exam Updated Vital Signs BP (!) 164/69   Pulse 63   Temp 97.6 F (36.4 C) (Oral)   Resp (!) 21   Ht 5\' 6"  (1.676 m)   Wt  63.5 kg   SpO2 98%   BMI 22.60 kg/m   Physical Exam Vitals signs and nursing note reviewed.    85 year old female, resting comfortably and in no acute distress. Vital signs are significant for elevated blood pressure. Oxygen saturation is 98%, which is normal. Head is normocephalic and atraumatic. PERRLA, EOMI. Oropharynx is clear. Neck is immobilized with a towel roll, and is nontender without adenopathy or JVD. Back is nontender and there is no CVA tenderness. Lungs are clear without rales, wheezes, or rhonchi. Chest is nontender. Heart has regular rate and rhythm without murmur. Abdomen is soft, flat, nontender without masses or hepatosplenomegaly and peristalsis is normoactive. Extremities have 2+ edema.  There is moderate to severe venous stasis changes.  Toes are mildly cyanotic.  Capillary refill is delayed to 6 seconds. (Daughter states that her feet and legs are at their baseline). Skin is warm and dry without rash. Neurologic: Awake, oriented to person but not place or time, cranial nerves are intact, there are no motor or sensory deficits.  ED Treatments / Results   Results for orders placed or performed during the hospital encounter of 08/25/18  Ethanol  Result Value Ref Range   Alcohol, Ethyl (B) <10 <10 mg/dL  Protime-INR  Result Value Ref Range   Prothrombin Time 14.0 11.4 - 15.2 seconds   INR 1.09   APTT  Result Value Ref Range   aPTT 27 24 - 36 seconds  CBC  Result Value Ref Range   WBC 10.2 4.0 - 10.5 K/uL   RBC 3.67 (L) 3.87 - 5.11 MIL/uL   Hemoglobin 10.4 (L) 12.0 - 15.0 g/dL   HCT 32.9 (L) 36.0 - 46.0 %   MCV 89.6 80.0 - 100.0 fL   MCH 28.3 26.0 - 34.0 pg   MCHC 31.6 30.0 - 36.0 g/dL   RDW 13.7 11.5 - 15.5 %   Platelets 274 150 - 400 K/uL   nRBC 0.0 0.0 - 0.2 %  Differential  Result Value Ref Range   Neutrophils Relative % 80 %   Neutro Abs 8.2 (H) 1.7 - 7.7 K/uL   Lymphocytes Relative 12 %   Lymphs Abs 1.2 0.7 - 4.0 K/uL   Monocytes Relative 6  %   Monocytes Absolute 0.6 0.1 - 1.0 K/uL   Eosinophils Relative 1 %   Eosinophils Absolute 0.1 0.0 - 0.5 K/uL   Basophils Relative 1 %   Basophils Absolute 0.1 0.0 - 0.1 K/uL   Immature Granulocytes 0 %   Abs Immature Granulocytes 0.02 0.00 - 0.07 K/uL  Comprehensive metabolic panel  Result Value Ref  Range   Sodium 142 135 - 145 mmol/L   Potassium 4.5 3.5 - 5.1 mmol/L   Chloride 109 98 - 111 mmol/L   CO2 24 22 - 32 mmol/L   Glucose, Bld 93 70 - 99 mg/dL   BUN 29 (H) 8 - 23 mg/dL   Creatinine, Ser 1.69 (H) 0.44 - 1.00 mg/dL   Calcium 8.9 8.9 - 10.3 mg/dL   Total Protein 7.2 6.5 - 8.1 g/dL   Albumin 3.6 3.5 - 5.0 g/dL   AST 24 15 - 41 U/L   ALT 13 0 - 44 U/L   Alkaline Phosphatase 58 38 - 126 U/L   Total Bilirubin 0.6 0.3 - 1.2 mg/dL   GFR calc non Af Amer 26 (L) >60 mL/min   GFR calc Af Amer 30 (L) >60 mL/min   Anion gap 9 5 - 15  Troponin I - ONCE - STAT  Result Value Ref Range   Troponin I 0.05 (HH) <0.03 ng/mL    EKG EKG Interpretation  Date/Time:  Thursday August 25 2018 06:21:26 EST Ventricular Rate:  61 PR Interval:    QRS Duration: 142 QT Interval:  473 QTC Calculation: 477 R Axis:   50 Text Interpretation:  Sinus rhythm Right bundle branch block When compared with ECG of 06/03/2015, No significant change was found Confirmed by Delora Fuel ([id number]) on 08/25/2018 6:26:09 AM   Radiology Ct Head Wo Contrast  Result Date: 08/25/2018 CLINICAL DATA:  Maxillofacial trauma EXAM: CT HEAD WITHOUT CONTRAST CT CERVICAL SPINE WITHOUT CONTRAST TECHNIQUE: Multidetector CT imaging of the head and cervical spine was performed following the standard protocol without intravenous contrast. Multiplanar CT image reconstructions of the cervical spine were also generated. COMPARISON:  02/10/2016 FINDINGS: CT HEAD FINDINGS Brain: Ill-defined high-density area at the anterior right putamen and neighboring white matter tracts, measuring up to 2.2 cm. Extending cranially from this hematoma  (doubt mineralization with this appearance) is an indistinct lateral lenticulostriate infarct without volume loss. No subarachnoid, subdural, or intraventricular hemorrhage. Chronic small vessel ischemia with confluent gliosis in the cerebral white matter. Vascular: Atherosclerotic calcification. Skull: Scalp swelling without calvarial fracture. Sinuses/Orbits: Bilateral cataract resection. CT CERVICAL SPINE FINDINGS Alignment: Mild C2-3 and C7-T1 anterolisthesis that is degenerative Skull base and vertebrae: Negative for acute fracture Soft tissues and spinal canal: No prevertebral fluid or swelling. No visible canal hematoma. Disc levels: C3-C6 solid arthrodesis versus ankylosis. Severe facet degeneration at C2-3 with anterolisthesis. No evident cord impingement. Upper chest: Biapical scarring. These results were called by telephone at the time of interpretation on 08/25/2018 at 7:22 am to Dr. Delora Fuel , who verbally acknowledged these results. IMPRESSION: 1. 2.2 cm hematoma in the right basal ganglia, not typical traumatic location and somewhat indistinct-likely subacute. There is an adjacent lateral lenticulostriate infarct which has an acute to subacute appearance. 2. Scalp contusion without calvarial fracture. 3. Negative for cervical spine fracture. Electronically Signed   By: Monte Fantasia M.D.   On: 08/25/2018 07:26   Ct Cervical Spine Wo Contrast  Result Date: 08/25/2018 CLINICAL DATA:  Maxillofacial trauma EXAM: CT HEAD WITHOUT CONTRAST CT CERVICAL SPINE WITHOUT CONTRAST TECHNIQUE: Multidetector CT imaging of the head and cervical spine was performed following the standard protocol without intravenous contrast. Multiplanar CT image reconstructions of the cervical spine were also generated. COMPARISON:  02/10/2016 FINDINGS: CT HEAD FINDINGS Brain: Ill-defined high-density area at the anterior right putamen and neighboring white matter tracts, measuring up to 2.2 cm. Extending cranially from this  hematoma (doubt  mineralization with this appearance) is an indistinct lateral lenticulostriate infarct without volume loss. No subarachnoid, subdural, or intraventricular hemorrhage. Chronic small vessel ischemia with confluent gliosis in the cerebral white matter. Vascular: Atherosclerotic calcification. Skull: Scalp swelling without calvarial fracture. Sinuses/Orbits: Bilateral cataract resection. CT CERVICAL SPINE FINDINGS Alignment: Mild C2-3 and C7-T1 anterolisthesis that is degenerative Skull base and vertebrae: Negative for acute fracture Soft tissues and spinal canal: No prevertebral fluid or swelling. No visible canal hematoma. Disc levels: C3-C6 solid arthrodesis versus ankylosis. Severe facet degeneration at C2-3 with anterolisthesis. No evident cord impingement. Upper chest: Biapical scarring. These results were called by telephone at the time of interpretation on 08/25/2018 at 7:22 am to Dr. Delora Fuel , who verbally acknowledged these results. IMPRESSION: 1. 2.2 cm hematoma in the right basal ganglia, not typical traumatic location and somewhat indistinct-likely subacute. There is an adjacent lateral lenticulostriate infarct which has an acute to subacute appearance. 2. Scalp contusion without calvarial fracture. 3. Negative for cervical spine fracture. Electronically Signed   By: Monte Fantasia M.D.   On: 08/25/2018 07:26   Dg Shoulder Left  Result Date: 08/25/2018 CLINICAL DATA:  Fall this morning with left shoulder pain. EXAM: LEFT SHOULDER - 2+ VIEW COMPARISON:  08/03/2011 humerus radiograph FINDINGS: Negative for fracture or dislocation. Advanced glenohumeral osteoarthritis. There is acromioclavicular osteoarthritis and subacromial spurring with subacromial space narrowing. Osteopenia. IMPRESSION: 1. No acute finding. 2. Osteopenia and advanced glenohumeral osteoarthritis. Subacromial spurring and narrowing. Electronically Signed   By: Monte Fantasia M.D.   On: 08/25/2018 07:03     Procedures Procedures   Medications Ordered in ED Medications - No data to display   Initial Impression / Assessment and Plan / ED Course  I have reviewed the triage vital signs and the nursing notes.  Pertinent imaging results that were available during my care of the patient were reviewed by me and considered in my medical decision making (see chart for details).  Fall at home without apparent injury.  She is being sent for x-rays of her left shoulder as well as CT of head and cervical spine.  She does have DNR paperwork with her, but MOST form has not been filled out.  I had a discussion with the daughter who states that there is been a rather sharp decline in her condition over the last several months and she is looking for some assistance with care at home, consideration for entering hospice.  Will ask care management to see patient.  Old records are reviewed, and she is being seen by palliative care.  Importance of completing MOST form was stressed.  CT appears to show a subacute right basal ganglia bleed.  This does not appear to be related to her fall.  Stroke work-up is initiated.  Case is discussed with Dr. Cheral Marker, neuro-hospitalist, who agrees to admit the patient.  Findings have been discussed with family.  Final Clinical Impressions(s) / ED Diagnoses   Final diagnoses:  Hemorrhagic stroke ([place])  Fall at home, initial encounter  Normochromic normocytic anemia    ED Discharge Orders    None       Delora Fuel, MD 95/32/02 [id number]

## 2018-08-25 NOTE — ED Provider Notes (Signed)
Power of attorney would like to be discharged home.  They will continue hospice at home and palliative care.  Resources are available to them at home.  Family would like full comfort care at this time.   Lennice Sites, DO 08/25/18 1037

## 2018-08-25 NOTE — Discharge Planning (Signed)
EDCM spoke with Patty Walters of [facility] to request "more hands" as requested by daughter, [name].  Patty Walters states they will evaluate pt when when returns home and provide needed care.  o further [facility] needs identified at this time.

## 2018-08-25 NOTE — Consult Note (Addendum)
NEUROLOGY CONSULT  Reason for Consult: ICH  Referring Physician: Dr. Ronnald Nian  CC: ICH  HPI: Patty Walters is an 85 y.o. female who is quite debilitated at baseline d/t rheumatoid arthritis, severe bilateral venous statis with 3+ edema and chronic wounds as well as bilateral hip pain. (Baseline mRS 4). She is followed by Palliative Care as outpt. She comes to the ER today after an unwitnessed fall, no LOC. CTH shows a Right BG ICH, which appears most consistent with a hypertensive bleed; however, the patient has no history of HTN. There is some underlying hypodensity which is concerning for a subacute infarct, or underlying metastatic lesion given her extensive melanoma history. Previous to today's fall family had inquired about hospice care and are not interested in further work up or being admitted to the hospital.   ICH Score is 1.   Past Medical History Past Medical History:  Diagnosis Date  . Allergy   . Anemia   . Blood transfusion without reported diagnosis   . Cancer ([place])   . Cataract   . Melanoma ([place])   . Rheumatoid arthritis(714.0)   . UTI (urinary tract infection)     Past Surgical History Past Surgical History:  Procedure Laterality Date  . APPENDECTOMY    . BREAST SURGERY Left 2010   ? breast cancer  . FRACTURE SURGERY    . hip replacement Bilateral   . JOINT REPLACEMENT    . MASTECTOMY Left     Family History Family History  Problem Relation Age of Onset  . Emphysema Father        smoked and was exposed to mustard gas  . COPD Father   . Cancer Brother   . Cancer Sister   . Cancer Sister        esophageal  . Hypertension Daughter   . Hypertension Son     Social History    reports that she has never smoked. She has never used smokeless tobacco. She reports that she does not drink alcohol or use drugs.  Allergies No Known Allergies  Home Medications       ROS: History obtained from daughter.   General ROS: fatigue and general weakness,  weight loss and slow functional decline Psychological ROS: negative for - behavioral disorder, hallucinations. Some mild memory difficulties Ophthalmic ROS: negative for - blurry vision, double vision, eye pain or loss of vision ENT ROS: negative for - epistaxis, nasal discharge, sore throat, tinnitus or vertigo Hematological and Lymphatic ROS: negative for - bleeding problems, bruising. She has had some swollen lymph nodes in neck, non-painful Endocrine ROS: negative for - polydipsia/polyuria or temperature intolerance Respiratory ROS: negative for - cough, hemoptysis, shortness of breath or wheezing Cardiovascular ROS: negative for - chest pain, dyspnea on exertion, edema or irregular heartbeat Gastrointestinal ROS: negative for - abdominal pain, diarrhea, hematemesis, nausea/vomiting Genito-Urinary ROS: negative for - dysuria, hematuria, incontinence or urinary frequency/urgency Musculoskeletal ROS: ongoing joint swelling and muscular weakness noted Neurological ROS: as noted in HPI Dermatological ROS: negative for rash, there are extensive skin lesions in bilateral feet lower legs along with extensive edema, redness and peeling skin   Physical Examination: Did note increase of BP with pain and discomfort only: Vitals:   08/25/18 0630 08/25/18 0645 08/25/18 0730 08/25/18 0930  BP: (!) 164/74 (!) 183/72 (!) 183/96 (!) 120/54  Pulse: 60 (!) 58 (!) 56 (!) 59  Resp: 20 (!) 29 15 20   Temp:      TempSrc:  SpO2: 97% 97% 100% 97%  Weight:      Height:        General - frail, elderly and in moderate distress Heart - Regular rate and rhythm - no murmur Lungs - Clear to auscultation Abdomen - Soft - non tender Extremities - Distal pulses intact in UE, unable to obtain in LEs d/t severe LE edema 3+ Skin - Warm and dry except for LE and feet with severe red, peeling skin with crusting.  Neurologic Examination:  Mental Status:  Alert, oriented to name "hospital", but not city/state or  date, thought content appropriate for most part. Speech without evidence of dysarthria or aphasia. Able to follow simple commands without difficulty.  Cranial Nerves:  II-bilateral visual fields intact with blink to threat III/IV/VI-Pupils were equal and reactive. Extraocular movements were full.  V/VII-there is some facial numbness on right side of forehead where extensive melanoma surgery was done; no facial weakness.  VIII-hearing normal.  X-normal speech and symmetrical palatal movement.  XII-midline tongue extension  Motor: General and diffusely weak, unable to lift or move LEs d/t extensive pain/edema. 4/5 UEs. Tone wnl Sensory: Intact to light touch in UE extremities. Unable to assess LE past knee d/t pain/swelling and pt request not to touch them Deep Tendon Reflexes: 2/4 UE; did not test LE out of respect for pt wishes Plantars: did not test Cerebellar: did not test Gait: not tested   LABORATORY STUDIES:  Basic Metabolic Panel: Recent Labs  Lab 08/25/18 0751  NA 142  K 4.5  CL 109  CO2 24  GLUCOSE 93  BUN 29*  CREATININE 1.69*  CALCIUM 8.9    Liver Function Tests: Recent Labs  Lab 08/25/18 0751  AST 24  ALT 13  ALKPHOS 58  BILITOT 0.6  PROT 7.2  ALBUMIN 3.6   No results for input(s): LIPASE, AMYLASE in the last 168 hours. No results for input(s): AMMONIA in the last 168 hours.  CBC: Recent Labs  Lab 08/25/18 0751  WBC 10.2  NEUTROABS 8.2*  HGB 10.4*  HCT 32.9*  MCV 89.6  PLT 274    Cardiac Enzymes: Recent Labs  Lab 08/25/18 0751  TROPONINI 0.05*    BNP: Invalid input(s): POCBNP  CBG: No results for input(s): GLUCAP in the last 168 hours.  Microbiology:   Coagulation Studies: Recent Labs    08/25/18 0751  LABPROT 14.0  INR 1.09    Urinalysis: No results for input(s): COLORURINE, LABSPEC, PHURINE, GLUCOSEU, HGBUR, BILIRUBINUR, KETONESUR, PROTEINUR, UROBILINOGEN, NITRITE, LEUKOCYTESUR in the last 168 hours.  Invalid input(s):  APPERANCEUR  Lipid Panel:  No results found for: CHOL, TRIG, HDL, CHOLHDL, VLDL, LDLCALC  HgbA1C:  Lab Results  Component Value Date   HGBA1C 5.6 02/20/2016    Urine Drug Screen:  No results found for: LABOPIA, COCAINSCRNUR, LABBENZ, AMPHETMU, THCU, LABBARB   Alcohol Level:  Recent Labs  Lab 08/25/18 0752  ETH <10    Miscellaneous labs:  EKG  EKG  IMAGING: Ct Head Wo Contrast  Result Date: 08/25/2018 CLINICAL DATA:  Maxillofacial trauma EXAM: CT HEAD WITHOUT CONTRAST CT CERVICAL SPINE WITHOUT CONTRAST TECHNIQUE: Multidetector CT imaging of the head and cervical spine was performed following the standard protocol without intravenous contrast. Multiplanar CT image reconstructions of the cervical spine were also generated. COMPARISON:  02/10/2016 FINDINGS: CT HEAD FINDINGS Brain: Ill-defined high-density area at the anterior right putamen and neighboring white matter tracts, measuring up to 2.2 cm. Extending cranially from this hematoma (doubt mineralization with this appearance)  is an indistinct lateral lenticulostriate infarct without volume loss. No subarachnoid, subdural, or intraventricular hemorrhage. Chronic small vessel ischemia with confluent gliosis in the cerebral white matter. Vascular: Atherosclerotic calcification. Skull: Scalp swelling without calvarial fracture. Sinuses/Orbits: Bilateral cataract resection. CT CERVICAL SPINE FINDINGS Alignment: Mild C2-3 and C7-T1 anterolisthesis that is degenerative Skull base and vertebrae: Negative for acute fracture Soft tissues and spinal canal: No prevertebral fluid or swelling. No visible canal hematoma. Disc levels: C3-C6 solid arthrodesis versus ankylosis. Severe facet degeneration at C2-3 with anterolisthesis. No evident cord impingement. Upper chest: Biapical scarring. These results were called by telephone at the time of interpretation on 08/25/2018 at 7:22 am to Dr. Delora Fuel , who verbally acknowledged these results.  IMPRESSION: 1. 2.2 cm hematoma in the right basal ganglia, not typical traumatic location and somewhat indistinct-likely subacute. There is an adjacent lateral lenticulostriate infarct which has an acute to subacute appearance. 2. Scalp contusion without calvarial fracture. 3. Negative for cervical spine fracture. Electronically Signed   By: Monte Fantasia M.D.   On: 08/25/2018 07:26   Ct Cervical Spine Wo Contrast  Result Date: 08/25/2018 CLINICAL DATA:  Maxillofacial trauma EXAM: CT HEAD WITHOUT CONTRAST CT CERVICAL SPINE WITHOUT CONTRAST TECHNIQUE: Multidetector CT imaging of the head and cervical spine was performed following the standard protocol without intravenous contrast. Multiplanar CT image reconstructions of the cervical spine were also generated. COMPARISON:  02/10/2016 FINDINGS: CT HEAD FINDINGS Brain: Ill-defined high-density area at the anterior right putamen and neighboring white matter tracts, measuring up to 2.2 cm. Extending cranially from this hematoma (doubt mineralization with this appearance) is an indistinct lateral lenticulostriate infarct without volume loss. No subarachnoid, subdural, or intraventricular hemorrhage. Chronic small vessel ischemia with confluent gliosis in the cerebral white matter. Vascular: Atherosclerotic calcification. Skull: Scalp swelling without calvarial fracture. Sinuses/Orbits: Bilateral cataract resection. CT CERVICAL SPINE FINDINGS Alignment: Mild C2-3 and C7-T1 anterolisthesis that is degenerative Skull base and vertebrae: Negative for acute fracture Soft tissues and spinal canal: No prevertebral fluid or swelling. No visible canal hematoma. Disc levels: C3-C6 solid arthrodesis versus ankylosis. Severe facet degeneration at C2-3 with anterolisthesis. No evident cord impingement. Upper chest: Biapical scarring. These results were called by telephone at the time of interpretation on 08/25/2018 at 7:22 am to Dr. Delora Fuel , who verbally acknowledged these  results. IMPRESSION: 1. 2.2 cm hematoma in the right basal ganglia, not typical traumatic location and somewhat indistinct-likely subacute. There is an adjacent lateral lenticulostriate infarct which has an acute to subacute appearance. 2. Scalp contusion without calvarial fracture. 3. Negative for cervical spine fracture. Electronically Signed   By: Monte Fantasia M.D.   On: 08/25/2018 07:26   Dg Shoulder Left  Result Date: 08/25/2018 CLINICAL DATA:  Fall this morning with left shoulder pain. EXAM: LEFT SHOULDER - 2+ VIEW COMPARISON:  08/03/2011 humerus radiograph FINDINGS: Negative for fracture or dislocation. Advanced glenohumeral osteoarthritis. There is acromioclavicular osteoarthritis and subacromial spurring with subacromial space narrowing. Osteopenia. IMPRESSION: 1. No acute finding. 2. Osteopenia and advanced glenohumeral osteoarthritis. Subacromial spurring and narrowing. Electronically Signed   By: Monte Fantasia M.D.   On: 08/25/2018 07:03     Assessment/Plan: This is a 85 year old female who is quite debilitated at baseline d/t rheumatoid arthritis, severe bilateral venous stasis with 3+ edema and chronic wounds as well as bilateral hip pain. She comes to the ER today for an unwitnessed fall. CTH shows a Right BG bleed, which is atypical for traumatic location and pt has no history  of HTN. There is some underlying hypodensity which is concerning for either a subacute infarct or underlying metastatic lesion given her extensive melanoma history.   # ICH (ICH score 1)- right BG, etiology possibly d/t underlying metastatic lesion vs underlying subacute infarct vs HTN bleed, although pt has never had HTN and has not been very elevated here outside of pain.  # Severe LE bilateral edema- has not improved with previous measures and wound care. Limits her mobility and increases risk for falls  # Fall- minimal trauma to left side of head, do not feel this contributed to [place]. Could have had the  bleed first that then made her more unsteady in setting of already compromised gait  # Advanced age with functional decline and deconditioning- followed by [facility] team and family has already been speaking of Hospice care.   # Advanced Care planning: Previous to today's fall family had inquired about hospice care and are not interested in further work up or being admitted to the hospital. Confirmed DNR/DNI and wishes to go home with comfort care  PLAN: Long d/w pts daughters at bedside. Pt lives w/one dtr here and other dtr is a Hospitalist in [place] with whom neurology team spoke to by phone. Images were reviewed with them and discussed options for admission. However it is felt that at this time this would cause pt great distress and to be in line with goals of care, d/c home with hospice would be more appropriate.  Neuro team has discussed aspiration risks and possible compromised swallowing d/t this [place] ER staff has been updated Notified and d/w LCSW and Case mgt. Pt will need stretcher for d/c home today I have spent 60 min in face/facetime, care, evaluation, support and coordination of care as outlined above.  Desiree Metzger-Cihelka, ARNP-C, ANVP-BC   Electronically signed: Dr. Kerney Elbe

## 2018-08-25 NOTE — ED Triage Notes (Signed)
Pt coming from EMS due to unwitnessed fall. Hit head on folding table. Small laceration to left back side of head with bleeding controlled. Is a palliative care pt. Family noting pt has had an increase in weakness and PO intake over past several days. CBG 135 from EMS.

## 2018-08-25 NOTE — Discharge Planning (Signed)
[facility] consulted regarding home hospice.  EDCM confirmed that pt is active with [facility].  [facility] is aware of pt ED admission and will follow for disposition needs.

## 2018-08-26 ENCOUNTER — Telehealth: Payer: Self-pay

## 2018-08-26 MED ORDER — OXYCODONE-ACETAMINOPHEN 5-325 MG PO TABS: 1.0000 | ORAL_TABLET | Freq: Four times a day (QID) | ORAL | 0 refills | Status: DC | PRN

## 2018-08-26 NOTE — Telephone Encounter (Signed)
Order for hospice eval received from [facility]. Hospice referral center made aware

## 2018-08-26 NOTE — Telephone Encounter (Signed)
Database verified and compliance confirmed   

## 2018-08-26 NOTE — Telephone Encounter (Signed)
At the direction of Violeta Gelinas NP, message sent to PCP to request an order for hospice evaluation as patient is declining. Daughter is in agreement with this.

## 2018-09-23 ENCOUNTER — Other Ambulatory Visit: Payer: Self-pay | Admitting: *Deleted

## 2018-09-23 DIAGNOSIS — M25552 Pain in left hip: Secondary | ICD-10-CM

## 2018-09-23 DIAGNOSIS — M069 Rheumatoid arthritis, unspecified: Secondary | ICD-10-CM

## 2018-09-23 MED ORDER — OXYCODONE-ACETAMINOPHEN 5-325 MG PO TABS: 1.0000 | ORAL_TABLET | Freq: Four times a day (QID) | ORAL | 0 refills | Status: AC | PRN

## 2018-09-23 NOTE — Telephone Encounter (Signed)
[name] called and stated that patient needs a refill on her Oxycodone 5/325 for 15 day supply. Stated patient is completely out.   Hospice Patient [name] Database Verified.  LR: 09/11/2018 #[phone] by Dr. Allie Dimmer Monguilod  Pended Rx and sent to [facility] for approval.

## 2018-09-23 NOTE — Telephone Encounter (Signed)
Since she is hospice and Dr. Allie Dimmer Monguilod has refilled then he would be the provider that would continue to refill. Please have them contact hospice for refill.

## 2018-09-23 NOTE — Telephone Encounter (Signed)
Spoke with Cristela Blue, per Cristela Blue Dr.Monguilod filled rx because refill was needed on a Sunday and we were not open.  I placed Maura on a brief hold and consulted with [name]. [name] reconsidered and states she will fill rx.  Message routed back to [name], NP

## 2018-10-18 NOTE — Telephone Encounter (Signed)
Error  Violeta Gelinas NP-C

## 2018-11-02 DEATH — deceased
